# Patient Record
Sex: Male | Born: 1935 | Race: White | Hispanic: No | Marital: Married | Smoking: Never smoker
Health system: Southern US, Community
[De-identification: ages and names within clinical notes are randomized; demographics above are authoritative.]

## PROBLEM LIST (undated history)

## (undated) DIAGNOSIS — I219 Acute myocardial infarction, unspecified: Secondary | ICD-10-CM

## (undated) DIAGNOSIS — I10 Essential (primary) hypertension: Secondary | ICD-10-CM

## (undated) DIAGNOSIS — I609 Nontraumatic subarachnoid hemorrhage, unspecified: Secondary | ICD-10-CM

## (undated) DIAGNOSIS — I509 Heart failure, unspecified: Secondary | ICD-10-CM

## (undated) DIAGNOSIS — F32A Depression, unspecified: Secondary | ICD-10-CM

## (undated) DIAGNOSIS — F329 Major depressive disorder, single episode, unspecified: Secondary | ICD-10-CM

## (undated) DIAGNOSIS — I499 Cardiac arrhythmia, unspecified: Secondary | ICD-10-CM

## (undated) DIAGNOSIS — E7800 Pure hypercholesterolemia, unspecified: Secondary | ICD-10-CM

## (undated) DIAGNOSIS — R55 Syncope and collapse: Secondary | ICD-10-CM

## (undated) DIAGNOSIS — R0602 Shortness of breath: Secondary | ICD-10-CM

## (undated) DIAGNOSIS — I2510 Atherosclerotic heart disease of native coronary artery without angina pectoris: Secondary | ICD-10-CM

## (undated) DIAGNOSIS — G709 Myoneural disorder, unspecified: Secondary | ICD-10-CM

## (undated) DIAGNOSIS — K219 Gastro-esophageal reflux disease without esophagitis: Secondary | ICD-10-CM

## (undated) DIAGNOSIS — Z950 Presence of cardiac pacemaker: Secondary | ICD-10-CM

## (undated) DIAGNOSIS — M1990 Unspecified osteoarthritis, unspecified site: Secondary | ICD-10-CM

## (undated) DIAGNOSIS — I259 Chronic ischemic heart disease, unspecified: Secondary | ICD-10-CM

## (undated) DIAGNOSIS — N189 Chronic kidney disease, unspecified: Secondary | ICD-10-CM

## (undated) HISTORY — DX: Syncope and collapse: R55

## (undated) HISTORY — PX: CORONARY ARTERY BYPASS GRAFT: SHX141

## (undated) HISTORY — PX: EYE SURGERY: SHX253

## (undated) HISTORY — PX: OTHER SURGICAL HISTORY: SHX169

## (undated) HISTORY — DX: Heart failure, unspecified: I50.9

## (undated) HISTORY — DX: Unspecified osteoarthritis, unspecified site: M19.90

## (undated) HISTORY — DX: Nontraumatic subarachnoid hemorrhage, unspecified: I60.9

## (undated) HISTORY — DX: Chronic ischemic heart disease, unspecified: I25.9

## (undated) HISTORY — DX: Pure hypercholesterolemia, unspecified: E78.00

---

## 1998-03-18 HISTORY — PX: CARDIAC CATHETERIZATION: SHX172

## 1998-05-29 ENCOUNTER — Other Ambulatory Visit: Admission: RE | Admit: 1998-05-29 | Discharge: 1998-05-29 | Payer: Self-pay | Admitting: Cardiology

## 2002-06-29 ENCOUNTER — Emergency Department (HOSPITAL_COMMUNITY): Admission: EM | Admit: 2002-06-29 | Discharge: 2002-06-29 | Payer: Self-pay | Admitting: Emergency Medicine

## 2003-05-16 ENCOUNTER — Encounter: Payer: Self-pay | Admitting: Cardiology

## 2003-05-16 ENCOUNTER — Encounter: Admission: RE | Admit: 2003-05-16 | Discharge: 2003-05-16 | Payer: Self-pay | Admitting: Cardiology

## 2003-11-17 ENCOUNTER — Inpatient Hospital Stay (HOSPITAL_COMMUNITY): Admission: EM | Admit: 2003-11-17 | Discharge: 2003-11-27 | Payer: Self-pay | Admitting: Emergency Medicine

## 2006-07-22 ENCOUNTER — Encounter: Admission: RE | Admit: 2006-07-22 | Discharge: 2006-07-22 | Payer: Self-pay | Admitting: Cardiology

## 2008-01-27 ENCOUNTER — Emergency Department (HOSPITAL_COMMUNITY): Admission: EM | Admit: 2008-01-27 | Discharge: 2008-01-27 | Payer: Self-pay | Admitting: Family Medicine

## 2008-02-05 ENCOUNTER — Ambulatory Visit (HOSPITAL_COMMUNITY): Admission: RE | Admit: 2008-02-05 | Discharge: 2008-02-05 | Payer: Self-pay | Admitting: Urology

## 2008-02-05 HISTORY — PX: CIRCUMCISION: SUR203

## 2010-08-03 ENCOUNTER — Ambulatory Visit: Payer: Self-pay | Admitting: Cardiology

## 2010-09-01 ENCOUNTER — Ambulatory Visit: Payer: Self-pay | Admitting: Cardiology

## 2010-10-05 ENCOUNTER — Ambulatory Visit: Payer: Self-pay | Admitting: Cardiology

## 2010-11-02 ENCOUNTER — Ambulatory Visit: Payer: Self-pay | Admitting: Cardiology

## 2010-12-07 ENCOUNTER — Ambulatory Visit: Payer: Self-pay | Admitting: Cardiology

## 2011-01-11 ENCOUNTER — Ambulatory Visit: Payer: Self-pay | Admitting: Cardiology

## 2011-01-13 ENCOUNTER — Encounter: Payer: Self-pay | Admitting: Cardiology

## 2011-01-23 ENCOUNTER — Encounter: Payer: Self-pay | Admitting: Cardiology

## 2011-01-23 DIAGNOSIS — I10 Essential (primary) hypertension: Secondary | ICD-10-CM | POA: Insufficient documentation

## 2011-02-01 ENCOUNTER — Encounter (INDEPENDENT_AMBULATORY_CARE_PROVIDER_SITE_OTHER): Payer: Medicare Other

## 2011-02-01 DIAGNOSIS — Z7901 Long term (current) use of anticoagulants: Secondary | ICD-10-CM

## 2011-02-01 DIAGNOSIS — R079 Chest pain, unspecified: Secondary | ICD-10-CM

## 2011-02-11 ENCOUNTER — Encounter (INDEPENDENT_AMBULATORY_CARE_PROVIDER_SITE_OTHER): Payer: Medicare Other

## 2011-02-11 DIAGNOSIS — Z7901 Long term (current) use of anticoagulants: Secondary | ICD-10-CM

## 2011-02-11 DIAGNOSIS — I252 Old myocardial infarction: Secondary | ICD-10-CM

## 2011-03-14 ENCOUNTER — Other Ambulatory Visit: Payer: Self-pay | Admitting: Cardiology

## 2011-03-14 DIAGNOSIS — R609 Edema, unspecified: Secondary | ICD-10-CM

## 2011-03-15 ENCOUNTER — Encounter: Payer: Self-pay | Admitting: Cardiology

## 2011-03-15 ENCOUNTER — Ambulatory Visit (INDEPENDENT_AMBULATORY_CARE_PROVIDER_SITE_OTHER): Payer: Medicare Other | Admitting: Cardiology

## 2011-03-15 DIAGNOSIS — I209 Angina pectoris, unspecified: Secondary | ICD-10-CM | POA: Insufficient documentation

## 2011-03-15 DIAGNOSIS — D509 Iron deficiency anemia, unspecified: Secondary | ICD-10-CM

## 2011-03-15 DIAGNOSIS — F418 Other specified anxiety disorders: Secondary | ICD-10-CM

## 2011-03-15 DIAGNOSIS — F341 Dysthymic disorder: Secondary | ICD-10-CM

## 2011-03-15 DIAGNOSIS — I10 Essential (primary) hypertension: Secondary | ICD-10-CM

## 2011-03-15 DIAGNOSIS — Z951 Presence of aortocoronary bypass graft: Secondary | ICD-10-CM

## 2011-03-15 DIAGNOSIS — E119 Type 2 diabetes mellitus without complications: Secondary | ICD-10-CM

## 2011-03-15 DIAGNOSIS — E7800 Pure hypercholesterolemia, unspecified: Secondary | ICD-10-CM

## 2011-03-15 LAB — POCT INR: INR: 2

## 2011-03-15 NOTE — Assessment & Plan Note (Signed)
At the present time the patient's blood pressure is at the upper limits of normal.  He will continue same medication and cut down on dietary salt.  He is not having any headaches associated with the blood pressure.

## 2011-03-15 NOTE — Progress Notes (Signed)
History of Present Illness: This 76 year old married Caucasian gentleman is seen for a scheduled followup office visit.  He has a history of severe ischemic heart disease.  He had his first bypass graft operation in 1984 and a redo CABG surgery in 1992.  He's had a history of labile hypertension and a history of dyslipidemia.  He is also diabetic.  He lives with his wife who has severe dementia from Alzheimer's.  Current Outpatient Prescriptions  Medication Sig Dispense Refill  . ALPRAZolam (XANAX) 0.25 MG tablet Take 0.25 mg by mouth at bedtime as needed.        Marland Kitchen aspirin 81 MG tablet Take 81 mg by mouth daily.        . citalopram (CELEXA) 20 MG tablet Take 20 mg by mouth daily. Taking prn       . docusate sodium (COLACE) 100 MG capsule Take 100 mg by mouth as directed.        . ferrous sulfate 325 (65 FE) MG tablet Take 325 mg by mouth 2 (two) times daily.        . furosemide (LASIX) 40 MG tablet Take 40 mg by mouth as directed. Taking one-half daily       . glimepiride (AMARYL) 4 MG tablet Take 4 mg by mouth every morning before breakfast.        . isosorbide mononitrate (IMDUR) 60 MG 24 hr tablet Take 60 mg by mouth as directed. Taking one am and one-half pm       . losartan (COZAAR) 50 MG tablet Take 50 mg by mouth daily.        . metFORMIN (GLUCOPHAGE) 500 MG tablet Take 500 mg by mouth 2 (two) times daily with meals.        . metoprolol (LOPRESSOR) 50 MG tablet Take 50 mg by mouth 2 (two) times daily.       . nitroGLYCERIN (NITROSTAT) 0.4 MG SL tablet Place 0.4 mg under the tongue every 5 (five) minutes as needed.        . pantoprazole (PROTONIX) 40 MG tablet Take 40 mg by mouth daily.        . pravastatin (PRAVACHOL) 10 MG tablet Take 10 mg by mouth daily.          Allergies  Allergen Reactions  . Lipitor (Atorvastatin Calcium)     Gi issues  . Macrodantin   . Penicillins   . Ranexa     constipation  . Statins     Leg weakness    Patient Active Problem List  Diagnoses  .  HBP (high blood pressure)  . S/P CABG (coronary artery bypass graft)  . Diabetes mellitus  . Hypercholesterolemia  . Iron deficiency anemia  . Anxiety associated with depression  . Angina pectoris    History  Smoking status  . Never Smoker   Smokeless tobacco  . Not on file    History  Alcohol Use     No family history on file.  Review of Systems: Constitutional: no fever chills diaphoresis or fatigue or change in weight.  Head and neck: no hearing loss, no epistaxis, no photophobia or visual disturbance. Respiratory: No cough, shortness of breath or wheezing. Cardiovascular: No chest pain peripheral edema, palpitations. Gastrointestinal: No abdominal distention, no abdominal pain, no change in bowel habits hematochezia or melena. Genitourinary: No dysuria, no frequency, no urgency, no nocturia. Musculoskeletal:No arthralgias, no back pain, no gait disturbance or myalgias. Neurological: No dizziness, no headaches, no numbness, no seizures,  no syncope, no weakness, no tremors. Hematologic: No lymphadenopathy, no easy bruising. Psychiatric: No confusion, no hallucinations, no sleep disturbance.    Physical Exam: Filed Vitals:   03/15/11 1705  BP: 140/80  Pulse: 64  Weight: 176 lb (79.833 kg)   Vital signs as recorded.  This is a elderly gentleman in no acute distress.Pupils equal and reactive.   Extraocular Movements are full.  There is no scleral icterus.  The mouth and pharynx are normal.  The neck is supple.  The carotids reveal no bruits.  The jugular venous pressure is normal.  The thyroid is not enlarged.  There is no lymphadenopathy.The chest is clear to percussion and auscultation. There are no rales or rhonchi. Expansion of the chest is symmetrical.The precordium is quiet.  The first heart sound is normal.  The second heart sound is physiologically split.  There is no murmur gallop rub or click.  There is no abnormal lift or heave.The abdomen is soft and nontender.  Bowel sounds are normal. The liver and spleen are not enlarged. There Are no abdominal masses. There are no bruits.The pedal pulses are good.  There is no phlebitis.There is mild chronic edema of the right foot and lower leg..  There is no cyanosis or clubbing.  Assessment / Plan:

## 2011-03-15 NOTE — Assessment & Plan Note (Signed)
The patient is tolerating low dose pravastatin 10 mg daily.  Continue same medication.

## 2011-03-15 NOTE — Assessment & Plan Note (Addendum)
The patient has had very little chest pain recently.  He has not had to take any recent sublingual nitroglycerin.  He remains on Isosorbide mononitrate twice a day.He remains on long-term Coumadin for his widespread ischemic heart disease.  His INR is 2.0 and he'll continue same dose.

## 2011-03-15 NOTE — Assessment & Plan Note (Signed)
The patient has been eating a lot of sweets according to his children.  We've asked him to cut down on sweets.  Continue present diabetic medication to

## 2011-03-18 NOTE — Telephone Encounter (Signed)
[facility] °

## 2011-04-12 ENCOUNTER — Telehealth: Payer: Self-pay | Admitting: *Deleted

## 2011-04-12 ENCOUNTER — Ambulatory Visit (INDEPENDENT_AMBULATORY_CARE_PROVIDER_SITE_OTHER): Payer: Medicare Other | Admitting: *Deleted

## 2011-04-12 ENCOUNTER — Encounter: Payer: PRIVATE HEALTH INSURANCE | Admitting: *Deleted

## 2011-04-12 DIAGNOSIS — L089 Local infection of the skin and subcutaneous tissue, unspecified: Secondary | ICD-10-CM

## 2011-04-12 DIAGNOSIS — I209 Angina pectoris, unspecified: Secondary | ICD-10-CM

## 2011-04-12 LAB — POCT INR: INR: 1.9

## 2011-04-12 NOTE — Telephone Encounter (Signed)
Patient came in for protime and wanted someone to look at his leg.  Patient had what appeared to be a laceration on right leg above ankle.  Patient denied injury, however he has neuropathy and does not feel much in that area.  Area surrounding (about 4-5 inches) red and warm to touch.  Lawson Fiscal took a look and prescribed levaquin 500 mg daily for 7 days.  Patient will recheck inr on Thursday and Lawson Fiscal will look at it again.  Lawson Fiscal advise if worse or no better to call back.

## 2011-04-12 NOTE — Telephone Encounter (Signed)
Agree with plan 

## 2011-04-15 ENCOUNTER — Ambulatory Visit (INDEPENDENT_AMBULATORY_CARE_PROVIDER_SITE_OTHER): Payer: Medicare Other | Admitting: *Deleted

## 2011-04-15 DIAGNOSIS — Z7901 Long term (current) use of anticoagulants: Secondary | ICD-10-CM

## 2011-04-15 DIAGNOSIS — I4891 Unspecified atrial fibrillation: Secondary | ICD-10-CM

## 2011-04-15 LAB — POCT INR
INR: 2
INR: 2

## 2011-04-15 MED ORDER — LEVOFLOXACIN 500 MG PO TABS: 500.0000 mg | ORAL_TABLET | Freq: Every day | ORAL | Status: AC

## 2011-04-15 NOTE — Patient Instructions (Signed)
Advised patient to continue same dose of coumadin and recheck INR in 2 weeks.  Benjamin Noble did look at patients leg today and he will finish levoquin and call back Monday with an update

## 2011-04-15 NOTE — Telephone Encounter (Signed)
Patient back in to day for a protime, inr 2.0 and advised to continue same dose of medications.  Lawson Fiscal looked at the leg and stated it didn't feel as warm and is showing some improvement.  Advised patient and son to call back Monday with an update and if not a lot of improvement will probably need to see his dermatologist Dr. Dub Amis.  Patient still taking his levaquin daily.  Pulses present in bilaterally.

## 2011-04-21 ENCOUNTER — Telehealth: Payer: Self-pay | Admitting: *Deleted

## 2011-04-21 NOTE — Telephone Encounter (Signed)
Called son to see how patients leg was doing.  Son stated leg has shown much improvement.  Advised to call back if anything else comes up.

## 2011-04-23 ENCOUNTER — Telehealth: Payer: Self-pay | Admitting: Cardiology

## 2011-04-23 DIAGNOSIS — H6690 Otitis media, unspecified, unspecified ear: Secondary | ICD-10-CM

## 2011-04-23 MED ORDER — AZITHROMYCIN 250 MG PO TABS: ORAL_TABLET | ORAL | Status: AC

## 2011-04-23 NOTE — Telephone Encounter (Signed)
Spoke with patient and he stated he has had problems with ear infections for years.  Advised to decrease coumadin to 2 mg daily for 10 days

## 2011-04-23 NOTE — Telephone Encounter (Signed)
Okay for Z-Pak.  For the next 10 days take only 2 mg Coumadin daily

## 2011-04-23 NOTE — Telephone Encounter (Signed)
CALL PT'S SON, LEFT EAR BOTHERING PT, NEEDS A ZPAK/USES [facility] [address] CHART PLACED IN BOX.

## 2011-04-23 NOTE — Telephone Encounter (Signed)
Is this ok?  Any adjustments in coumadin?

## 2011-05-03 ENCOUNTER — Encounter: Payer: Self-pay | Admitting: Cardiology

## 2011-05-03 ENCOUNTER — Other Ambulatory Visit (INDEPENDENT_AMBULATORY_CARE_PROVIDER_SITE_OTHER): Payer: PRIVATE HEALTH INSURANCE | Admitting: *Deleted

## 2011-05-03 DIAGNOSIS — I259 Chronic ischemic heart disease, unspecified: Secondary | ICD-10-CM

## 2011-05-04 NOTE — Op Note (Signed)
NAME:  Benjamin Noble, Benjamin Noble NO.:  [id number]   MEDICAL RECORD NO.:  [id number]          PATIENT TYPE:  AMB   LOCATION:  DAY                          FACILITY:  [facility]   PHYSICIAN:  Bertram Millard. Dahlstedt, M.D.DATE OF BIRTH:  1922/01/04   DATE OF PROCEDURE:  02/05/2008  DATE OF DISCHARGE:  02/05/2008                               OPERATIVE REPORT   PREOPERATIVE DIAGNOSIS:  Phimosis, hematuria.   POSTOPERATIVE DIAGNOSIS:  Phimosis, hematuria.   PROCEDURE:  Circumcision, cystoscopy.   SURGEON:  Bertram Millard. Dahlstedt, M.D.   ANESTHESIA:  Local with MAC.   COMPLICATIONS:  None.   SPECIMEN:  Foreskin to the trash.   BRIEF HISTORY:  An 77 year old male who recently presented with  difficulty urinating.  Over the past few months he has had ballooning of  his foreskin with urinating.  He has been unable to retract his foreskin  for quite some time.  He was seen in urgent care and sent here with a  Foley catheter in.  He has had some bleeding around this catheter, but  denies any gross hematuria prior to placement the catheter.  It was  recommended that he have circumcision to decrease  his symptoms of  urinary obstruction, and cystoscopy to evaluate gross hematuria.  He  presents for that procedure at the present time.   DESCRIPTION OF PROCEDURE:  The patient was identified in the holding  area, taken to the operating room where he was placed in the supine  position on the table.  He was sedated.  Genitalia and perineum were  prepped and draped.  A dorsal penile block was performed with  approximately 12 mL of half percent plain Marcaine.  Circumcising  incisions were made proximally and distally in the foreskin with the  skin excised.  The skin suturing was performed with at first chromic  sutures placed in simple interrupted fashion on four quadrants.  The  same chromic was then used to run the skin in a simple running fashion  in between.  At this point, flexible  cystoscopy was performed.  The  urethra was normal.  The prostate was nonobstructed from what I could  see.  There was mild irritation/inflammation of the bladder neck, most  likely from indwelling catheter x 2 weeks.  No lesions were seen  within the bladder.  I felt that since there would be no obstruction, a  catheter was not left in.  His bladder was drained with a red Robinson  catheter.  Dressings were placed on the circumcision wound.  At this  point the procedure was terminated.  The patient was awakened and taken  to PACU in stable condition.      Bertram Millard. Dahlstedt, M.D.  Electronically Signed     SMD/MEDQ  D:  02/05/2008  T:  02/06/2008  Job:  [id number]   cc:   Cassell Clement, M.D.  Fax: [phone]

## 2011-05-07 NOTE — Discharge Summary (Signed)
NAME:  Benjamin Noble, Benjamin Noble                  ACCOUNT NO.:  [id number]   MEDICAL RECORD NO.:  [id number]                   PATIENT TYPE:  INP   LOCATION:  3712                                 FACILITY:  [facility]   PHYSICIAN:  Vesta Mixer, M.D.              DATE OF BIRTH:  [birth year]   DATE OF ADMISSION:  11/17/2003  DATE OF DISCHARGE:  11/19/2003                                 DISCHARGE SUMMARY   DISCHARGE DIAGNOSES:  1. Coronary artery disease.  2. Congestive heart failure.  3. Diabetes mellitus.  4. Chronic anticoagulation.  5. Hyperlipidemia.  6. Chronic left leg pain.   DISCHARGE MEDICATIONS:  1. Enteric coated aspirin 325 mg a day.  2. Imdur 90 mg a day.  3. Glucophage 500 mg b.i.d.  4. Lasix 40 mg b.i.d.  5. Plavix 75 mg a day.  6. Metoprolol 50 mg b.i.d.  7. Cozaar 50 mg a day.  8. Coumadin 2 mg 5 days a week with 4 mg on Mondays and Fridays.  9. Crestor 10 mg a day.  10.      Zantac 75 mg b.i.d.  11.      Colace as needed.  12.      Prandin 2 mg a day.  13.      Nitroglycerin patch as directed previously by Dr. Patty Sermons.   DISPOSITION:  The patient will return to see Dr. Patty Sermons in 1 to 2 weeks.  He should basically continue the same medications that he was on at home. He  is to ambulate as much as possible.   HISTORY OF PRESENT ILLNESS:  Mr. Chelf is a 77 year old gentleman with a  history of  severe  inoperable coronary artery disease. He is admitted by  Dr. Mayford Knife on November 17, 2003, for episodes of chest pain. Please see the  dictated history and physical for further details.   HOSPITAL COURSE BY PROBLEM:  PROBLEM #1, CORONARY ARTERY DISEASE:  The  patient's chest pain resolved fairly quickly during the hospitalization. He  did have mild elevations of his troponin level  consistent with a  subendocardial myocardial infarction. He remained quite stable and did not  have any further  episodes of pain. His last heart  catheterization in 1999  revealed  severe diffuse disease and he was thought not to be a candidate  for any further interventions at that time. I had a long discussion with Mr.  Morones and he decided that he did not  want to have any heart  catheterization at this time because of the low  likelihood of finding anything to fix. He remained quite stable on his home  medications and we will discharge  him on the above noted medications. He is  to call us if he has any further  episodes of chest pain. All of his other  medical problems are relatively stable.  Vesta Mixer, M.D.    PJN/MEDQ  D:  11/19/2003  T:  11/19/2003  Job:  [id number]   cc:   Cassell Clement, M.D.  [address]  [place]  [place] [address]  Fax: [phone]

## 2011-05-14 ENCOUNTER — Telehealth: Payer: Self-pay | Admitting: Cardiology

## 2011-05-14 NOTE — Telephone Encounter (Signed)
Pt's son called said he had FMLA paperwork to fill out please call

## 2011-05-14 NOTE — Telephone Encounter (Signed)
Forms on Dr Jenness Corner desk

## 2011-05-24 ENCOUNTER — Encounter: Payer: Self-pay | Admitting: Cardiology

## 2011-05-24 ENCOUNTER — Ambulatory Visit (INDEPENDENT_AMBULATORY_CARE_PROVIDER_SITE_OTHER): Payer: Medicare Other | Admitting: *Deleted

## 2011-05-24 ENCOUNTER — Ambulatory Visit (INDEPENDENT_AMBULATORY_CARE_PROVIDER_SITE_OTHER): Payer: Medicare Other | Admitting: Cardiology

## 2011-05-24 DIAGNOSIS — E119 Type 2 diabetes mellitus without complications: Secondary | ICD-10-CM

## 2011-05-24 DIAGNOSIS — I259 Chronic ischemic heart disease, unspecified: Secondary | ICD-10-CM

## 2011-05-24 DIAGNOSIS — I209 Angina pectoris, unspecified: Secondary | ICD-10-CM

## 2011-05-24 DIAGNOSIS — I10 Essential (primary) hypertension: Secondary | ICD-10-CM

## 2011-05-24 LAB — POCT INR: INR: 3.2

## 2011-05-24 NOTE — Assessment & Plan Note (Signed)
Since last visit the patient has cut back on his dietary salt.  On his last visit his blood pressure was on the high side but presently a discogram to normal.  The patient has not been expressing any dizzy spells or syncope.

## 2011-05-24 NOTE — Assessment & Plan Note (Signed)
[facility]  At their request we will switch him from brand name Amaryl to generic glimepiride 4 mg each morning.  Likewise we will switch him from Cozaar 50 mg to losartan 50 mg each day.  We also called in some nitroglycerin 0.4 mg sublingually p.r.n.

## 2011-05-24 NOTE — Progress Notes (Signed)
Benjamin Noble Date of Birth:  [birth year] [facility] [address]   [address] [place], [place]  [address] [phone]           Fax   [phone]  History of Present Illness: This 77 year old gentleman is seen for a scheduled two-month followup office visit.  He has a complex past medical history.  He has a history of severe ischemic heart disease.  He had coronary artery bypass graft surgery in 1984.  He had a redo CABG in 1992.  He's had frequent chest pain consistent with angina pectoris although recently his chest pain frequency has decreased because his activity level is decreased.  He has a history of diabetes mellitus and a history of essential hypertension and a history of hypercholesterolemia.  His last nuclear stress test was in 1987 and was a RNA stress test which look at wall motion 3 years after his initial bypass surgery.  His last echocardiogram was 01/15/08 showing an ejection fraction of 55-60% and mild aortic sclerosis and normal pulmonary artery pressure.  Patient is on long-term Coumadin because of his intractable angina pectoris.  He's not having any problems from the Coumadin.  Current Outpatient Prescriptions  Medication Sig Dispense Refill  . aspirin 81 MG tablet Take 81 mg by mouth daily.        Marland Kitchen docusate sodium (COLACE) 100 MG capsule Take 100 mg by mouth as directed.        . ferrous sulfate 325 (65 FE) MG tablet Take 325 mg by mouth daily with breakfast.       . furosemide (LASIX) 40 MG tablet TAKE ONE-HALF TABLET BY MOUTH EVERY DAY  30 tablet  11  . glimepiride (AMARYL) 4 MG tablet Take 4 mg by mouth every morning before breakfast.        . isosorbide mononitrate (IMDUR) 60 MG 24 hr tablet Take 60 mg by mouth as directed. Taking one am and one-half pm       . losartan (COZAAR) 50 MG tablet Take 50 mg by mouth daily.        . metFORMIN (GLUCOPHAGE) 500 MG tablet Take 500 mg by mouth 2 (two) times daily with meals.        .  metoprolol (LOPRESSOR) 50 MG tablet Take 50 mg by mouth 3 (three) times daily.       . nitroGLYCERIN (NITROSTAT) 0.4 MG SL tablet Place 0.4 mg under the tongue every 5 (five) minutes as needed.        . pantoprazole (PROTONIX) 40 MG tablet Take 40 mg by mouth daily.        Marland Kitchen ALPRAZolam (XANAX) 0.25 MG tablet Take 0.25 mg by mouth at bedtime as needed.        . citalopram (CELEXA) 20 MG tablet Take 20 mg by mouth daily. Taking prn       . DISCONTD: pravastatin (PRAVACHOL) 10 MG tablet Take 10 mg by mouth daily.          Allergies  Allergen Reactions  . Lipitor (Atorvastatin Calcium)     Gi issues  . Macrodantin   . Penicillins   . Ranexa     constipation  . Statins     Leg weakness    Patient Active Problem List  Diagnoses  . HBP (high blood pressure)  . S/P CABG (coronary artery bypass graft)  . Diabetes mellitus  . Hypercholesterolemia  . Iron deficiency anemia  . Anxiety associated  with depression  . Angina pectoris    History  Smoking status  . Never Smoker   Smokeless tobacco  . Not on file    History  Alcohol Use     No family history on file.  Review of Systems: Constitutional: no fever chills diaphoresis or fatigue or change in weight.  Head and neck: no hearing loss, no epistaxis, no photophobia or visual disturbance. Respiratory: No cough, shortness of breath or wheezing. Cardiovascular: No chest pain peripheral edema, palpitations. Gastrointestinal: No abdominal distention, no abdominal pain, no change in bowel habits hematochezia or melena. Genitourinary: No dysuria, no frequency, no urgency, no nocturia. Musculoskeletal:No arthralgias, no back pain, no gait disturbance or myalgias. Neurological: No dizziness, no headaches, no numbness, no seizures, no syncope, no weakness, no tremors. Hematologic: No lymphadenopathy, no easy bruising. Psychiatric: No confusion, no hallucinations, no sleep disturbance.    Physical Exam: Filed Vitals:   05/24/11  1103  BP: 105/60  Pulse: 74  The general appearance is that of a slightly pale gentleman in no acute distress.  He is in a wheelchair.Pupils equal and reactive.   Extraocular Movements are full.  There is no scleral icterus.  The mouth and pharynx are normal.  The neck is supple.  The carotids reveal no bruits.  The jugular venous pressure is normal.  The thyroid is not enlarged.  There is no lymphadenopathy.The chest is clear to percussion and auscultation. There are no rales or rhonchi. Expansion of the chest is symmetrical.The precordium is quiet.  The first heart sound is normal.  The second heart sound is physiologically split.  There is no murmur gallop rub or click.  There is no abnormal lift or heave.The abdomen is soft and nontender. Bowel sounds are normal. The liver and spleen are not enlarged. There Are no abdominal masses. There are no bruits.  Normal extremity without phlebitis or edema.The skin is warm and dry.  There is no rash.  He does have some bruises from his warfarin.Strength is normal and symmetrical in all extremities.  There is no lateralizing weakness.  There are no sensory deficits.   Assessment / Plan: Continue same medication.  Recheck in 2 months for office visit and fasting lab work

## 2011-05-24 NOTE — Assessment & Plan Note (Signed)
The patient has a past history of severe intractable angina pectoris.  Now he is more sedentary his angina has resolved and is less frequent

## 2011-06-07 ENCOUNTER — Ambulatory Visit (INDEPENDENT_AMBULATORY_CARE_PROVIDER_SITE_OTHER): Payer: Medicare Other | Admitting: *Deleted

## 2011-06-07 DIAGNOSIS — I4891 Unspecified atrial fibrillation: Secondary | ICD-10-CM

## 2011-06-07 LAB — POCT INR: INR: 1.6

## 2011-06-09 ENCOUNTER — Encounter: Payer: Self-pay | Admitting: Cardiology

## 2011-06-11 ENCOUNTER — Other Ambulatory Visit: Payer: Self-pay | Admitting: *Deleted

## 2011-06-16 ENCOUNTER — Other Ambulatory Visit: Payer: Self-pay | Admitting: *Deleted

## 2011-06-16 DIAGNOSIS — R110 Nausea: Secondary | ICD-10-CM

## 2011-06-16 DIAGNOSIS — I2510 Atherosclerotic heart disease of native coronary artery without angina pectoris: Secondary | ICD-10-CM

## 2011-06-16 DIAGNOSIS — F329 Major depressive disorder, single episode, unspecified: Secondary | ICD-10-CM

## 2011-06-16 DIAGNOSIS — R52 Pain, unspecified: Secondary | ICD-10-CM

## 2011-06-16 DIAGNOSIS — R197 Diarrhea, unspecified: Secondary | ICD-10-CM

## 2011-06-16 MED ORDER — NITROGLYCERIN 0.4 MG SL SUBL: 0.4000 mg | SUBLINGUAL_TABLET | SUBLINGUAL | Status: DC | PRN

## 2011-06-16 MED ORDER — DIPHENOXYLATE-ATROPINE 2.5-0.025 MG PO TABS: 1.0000 | ORAL_TABLET | Freq: Four times a day (QID) | ORAL | Status: AC | PRN

## 2011-06-16 MED ORDER — ACETAMINOPHEN 500 MG PO TABS: 500.0000 mg | ORAL_TABLET | Freq: Four times a day (QID) | ORAL | Status: AC | PRN

## 2011-06-16 MED ORDER — ASPIRIN 81 MG PO CHEW: 81.0000 mg | CHEWABLE_TABLET | Freq: Every day | ORAL | Status: DC

## 2011-06-16 MED ORDER — PROMETHAZINE HCL 25 MG PO TABS: 25.0000 mg | ORAL_TABLET | Freq: Four times a day (QID) | ORAL | Status: AC | PRN

## 2011-06-16 MED ORDER — ISOSORBIDE MONONITRATE ER 30 MG PO TB24: ORAL_TABLET | ORAL | Status: DC

## 2011-06-16 MED ORDER — IBUPROFEN 200 MG PO TABS: 200.0000 mg | ORAL_TABLET | Freq: Four times a day (QID) | ORAL | Status: AC | PRN

## 2011-06-16 MED ORDER — CITALOPRAM HYDROBROMIDE 20 MG PO TABS: 20.0000 mg | ORAL_TABLET | Freq: Every day | ORAL | Status: DC

## 2011-06-16 NOTE — Telephone Encounter (Signed)
Son brought in a list of medications he needed sent to [place], will fax to [phone] attn: Dr Jules Husbands.  Advised son to only let patient use ibuprofen sparingly.

## 2011-06-21 ENCOUNTER — Ambulatory Visit (INDEPENDENT_AMBULATORY_CARE_PROVIDER_SITE_OTHER): Payer: Medicare Other | Admitting: *Deleted

## 2011-06-21 DIAGNOSIS — I4891 Unspecified atrial fibrillation: Secondary | ICD-10-CM

## 2011-06-21 LAB — POCT INR: INR: 1.3

## 2011-06-24 ENCOUNTER — Telehealth: Payer: Self-pay | Admitting: Cardiology

## 2011-06-24 NOTE — Telephone Encounter (Signed)
WANTS TO SPEAK TO RN ABOUT PATIENT-restlessness

## 2011-06-24 NOTE — Telephone Encounter (Signed)
Son states dad is restless secondary to mother and her problems.  Never contacted neurology for follow up with mother.  Advised he needed to call and get appointment with them.

## 2011-06-28 ENCOUNTER — Telehealth: Payer: Self-pay | Admitting: Cardiology

## 2011-06-28 NOTE — Telephone Encounter (Signed)
Patient son calling and wants to make sure scripts have been faxed to [place].  Please call with status

## 2011-06-28 NOTE — Telephone Encounter (Signed)
Advised sent

## 2011-07-01 ENCOUNTER — Ambulatory Visit (INDEPENDENT_AMBULATORY_CARE_PROVIDER_SITE_OTHER): Payer: Medicare Other | Admitting: *Deleted

## 2011-07-01 DIAGNOSIS — I4891 Unspecified atrial fibrillation: Secondary | ICD-10-CM

## 2011-07-01 LAB — POCT INR: INR: 2.7

## 2011-07-12 ENCOUNTER — Other Ambulatory Visit: Payer: Self-pay | Admitting: Cardiology

## 2011-07-12 DIAGNOSIS — E119 Type 2 diabetes mellitus without complications: Secondary | ICD-10-CM

## 2011-07-12 DIAGNOSIS — I4891 Unspecified atrial fibrillation: Secondary | ICD-10-CM

## 2011-07-12 DIAGNOSIS — I119 Hypertensive heart disease without heart failure: Secondary | ICD-10-CM

## 2011-07-12 MED ORDER — WARFARIN SODIUM 2 MG PO TABS: ORAL_TABLET | ORAL | Status: DC

## 2011-07-12 MED ORDER — LOSARTAN POTASSIUM 50 MG PO TABS: 50.0000 mg | ORAL_TABLET | Freq: Every day | ORAL | Status: DC

## 2011-07-12 MED ORDER — GLIMEPIRIDE 4 MG PO TABS: 4.0000 mg | ORAL_TABLET | Freq: Every day | ORAL | Status: DC

## 2011-07-12 MED ORDER — WARFARIN SODIUM 5 MG PO TABS: ORAL_TABLET | ORAL | Status: DC

## 2011-07-12 NOTE — Telephone Encounter (Signed)
CALL PT'S SON ABOUT HIS MEDS AND THE ONES HE NEEDS [facility] CALL BACK AT CELL [phone]. CHART IN BOX.

## 2011-07-12 NOTE — Telephone Encounter (Signed)
Spoke with son, [facility] to [facility]

## 2011-07-26 ENCOUNTER — Encounter: Payer: PRIVATE HEALTH INSURANCE | Admitting: *Deleted

## 2011-07-26 ENCOUNTER — Other Ambulatory Visit: Payer: Self-pay | Admitting: *Deleted

## 2011-07-26 ENCOUNTER — Other Ambulatory Visit: Payer: PRIVATE HEALTH INSURANCE | Admitting: *Deleted

## 2011-07-26 ENCOUNTER — Encounter: Payer: Self-pay | Admitting: Cardiology

## 2011-07-26 ENCOUNTER — Ambulatory Visit (INDEPENDENT_AMBULATORY_CARE_PROVIDER_SITE_OTHER): Payer: Medicare Other | Admitting: Cardiology

## 2011-07-26 ENCOUNTER — Ambulatory Visit (INDEPENDENT_AMBULATORY_CARE_PROVIDER_SITE_OTHER): Payer: PRIVATE HEALTH INSURANCE | Admitting: *Deleted

## 2011-07-26 VITALS — BP 140/80 | HR 78 | Wt 175.0 lb

## 2011-07-26 DIAGNOSIS — I209 Angina pectoris, unspecified: Secondary | ICD-10-CM

## 2011-07-26 DIAGNOSIS — E7800 Pure hypercholesterolemia, unspecified: Secondary | ICD-10-CM

## 2011-07-26 DIAGNOSIS — I10 Essential (primary) hypertension: Secondary | ICD-10-CM

## 2011-07-26 DIAGNOSIS — E119 Type 2 diabetes mellitus without complications: Secondary | ICD-10-CM

## 2011-07-26 DIAGNOSIS — I4891 Unspecified atrial fibrillation: Secondary | ICD-10-CM

## 2011-07-26 DIAGNOSIS — R0989 Other specified symptoms and signs involving the circulatory and respiratory systems: Secondary | ICD-10-CM

## 2011-07-26 LAB — BASIC METABOLIC PANEL
BUN: 29 mg/dL — ABNORMAL HIGH (ref 6–23)
Creatinine, Ser: 1.1 mg/dL (ref 0.4–1.5)
GFR: 65.27 mL/min (ref >60.00)
Glucose, Bld: 159 mg/dL — ABNORMAL HIGH (ref 70–99)

## 2011-07-26 LAB — CBC WITH DIFFERENTIAL/PLATELET
Basophils Absolute: 0 10*3/uL (ref 0.0–0.1)
Eosinophils Absolute: 0.3 10*3/uL (ref 0.0–0.7)
HCT: 39.8 % (ref 39.0–52.0)
Lymphs Abs: 1 10*3/uL (ref 0.7–4.0)
MCHC: 33.2 g/dL (ref 30.0–36.0)
MCV: 94.2 fl (ref 78.0–100.0)
Monocytes Absolute: 0.8 10*3/uL (ref 0.1–1.0)
Monocytes Relative: 10.3 % (ref 3.0–12.0)
Neutro Abs: 6 10*3/uL (ref 1.4–7.7)
Platelets: 278 10*3/uL (ref 150.0–400.0)
RDW: 13.8 % (ref 11.5–14.6)

## 2011-07-26 LAB — POCT INR: INR: 2.7

## 2011-07-26 LAB — HEPATIC FUNCTION PANEL
ALT: 24 U/L (ref 0–53)
AST: 17 U/L (ref 0–37)
Alkaline Phosphatase: 106 U/L (ref 39–117)
Bilirubin, Direct: 0.1 mg/dL (ref 0.0–0.3)
Total Bilirubin: 0.9 mg/dL (ref 0.3–1.2)
Total Protein: 7.1 g/dL (ref 6.0–8.3)

## 2011-07-26 LAB — HEMOGLOBIN A1C: Hgb A1c MFr Bld: 8 % — ABNORMAL HIGH (ref 4.6–6.5)

## 2011-07-26 LAB — LIPID PANEL: Cholesterol: 153 mg/dL (ref 0–200)

## 2011-07-26 NOTE — Assessment & Plan Note (Signed)
The patient has not been experiencing any hypoglycemic episodes. 

## 2011-07-26 NOTE — Assessment & Plan Note (Signed)
Patient has a past history of elevated blood pressure.  He has had normal readings recently on his present medications.  His not having a problem with headaches.  He's not had any symptoms of congestive heart failure

## 2011-07-26 NOTE — Assessment & Plan Note (Signed)
No change in his frequent sublingual use of nitroglycerin for angina pectoris.  The patient is weak and has difficulty moving about his house with a manual wheelchair and has requested a motorized wheelchair.  I gave him a prescription for that today.  It tires him out greatly to try to self propel himself around in his manual wheelchair.

## 2011-07-26 NOTE — Progress Notes (Signed)
[name] Date of Birth:  01-02-1922 [facility] / [facility] [address]   [address] [place], [place]  [address] [phone]           Fax   [phone]  History of Present Illness: This pleasant 77 year old gentleman is seen for a scheduled followup office visit.  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  He had coronary artery bypass graft surgery in 1984 and he had a redo CABG in 1992.  He is not a candidate for further invasive procedures.  He does take frequent sublingual nitroglycerin.  He has been on long-term Coumadin because of angina decubitus.  He's had a history of hypercholesterolemia and hypertension.  He is also diabetic.  He's been under a lot of stress caring for his wife who has dementia.  Current Outpatient Prescriptions  Medication Sig Dispense Refill  . ALPRAZolam (XANAX) 0.25 MG tablet Take 0.25 mg by mouth at bedtime as needed.        Marland Kitchen aspirin 81 MG chewable tablet Chew 1 tablet (81 mg total) by mouth daily.  90 tablet  3  . citalopram (CELEXA) 20 MG tablet Take 1 tablet (20 mg total) by mouth daily.  90 tablet  3  . docusate sodium (COLACE) 100 MG capsule Take 100 mg by mouth as directed.        . ferrous sulfate 325 (65 FE) MG tablet Take 325 mg by mouth daily with breakfast.       . furosemide (LASIX) 40 MG tablet TAKE ONE-HALF TABLET BY MOUTH EVERY DAY  30 tablet  11  . glimepiride (AMARYL) 4 MG tablet Take 1 tablet (4 mg total) by mouth daily before breakfast.  90 tablet  3  . isosorbide mononitrate (IMDUR) 30 MG 24 hr tablet Take 2 in the morning and 1 in the evening  270 tablet  3  . losartan (COZAAR) 50 MG tablet Take 1 tablet (50 mg total) by mouth daily.  90 tablet  3  . metFORMIN (GLUCOPHAGE) 500 MG tablet Take 500 mg by mouth 2 (two) times daily with meals.        . metoprolol (LOPRESSOR) 50 MG tablet Take 50 mg by mouth 3 (three) times daily.       . nitroGLYCERIN (NITROSTAT) 0.4 MG SL tablet  Place 1 tablet (0.4 mg total) under the tongue every 5 (five) minutes as needed.  100 tablet  3  . pantoprazole (PROTONIX) 40 MG tablet Take 40 mg by mouth daily.        Marland Kitchen warfarin (COUMADIN) 2 MG tablet One tablet Tuesday, Thursday, Saturday, and Sunday or as directed  90 tablet  3  . warfarin (COUMADIN) 5 MG tablet One tablet on Monday, Wednesday, and Friday or as directed  60 tablet  11    Allergies  Allergen Reactions  . Lipitor (Atorvastatin Calcium)     Gi issues  . Macrodantin   . Penicillins   . Ranexa     constipation  . Statins     Leg weakness    Patient Active Problem List  Diagnoses  . HBP (high blood pressure)  . S/P CABG (coronary artery bypass graft)  . Diabetes mellitus  . Hypercholesterolemia  . Iron deficiency anemia  . Anxiety associated with depression  . Angina pectoris  . S/P CABG (coronary artery bypass graft)  . S/P CABG (coronary artery bypass graft)    History  Smoking status  .  Never Smoker   Smokeless tobacco  . Not on file    History  Alcohol Use     No family history on file.  Review of Systems: Constitutional: no fever chills diaphoresis or fatigue or change in weight.  Head and neck: no hearing loss, no epistaxis, no photophobia or visual disturbance. Respiratory: No cough, shortness of breath or wheezing. Cardiovascular: No chest pain peripheral edema, palpitations. Gastrointestinal: No abdominal distention, no abdominal pain, no change in bowel habits hematochezia or melena. Genitourinary: No dysuria, no frequency, no urgency, no nocturia. Musculoskeletal:No arthralgias, no back pain, no gait disturbance or myalgias. Neurological: No dizziness, no headaches, no numbness, no seizures, no syncope, no weakness, no tremors. Hematologic: No lymphadenopathy, no easy bruising. Psychiatric: No confusion, no hallucinations, no sleep disturbance.    Physical Exam: Filed Vitals:   07/26/11 0935  BP: 140/80  Pulse: 78   The  general appearance reveals a slightly pale gentleman who arrives in a manual wheelchair.  He appears to be weak.  He is in no acute distress.Pupils equal and reactive.   Extraocular Movements are full.  There is no scleral icterus.  The mouth and pharynx are normal.  The neck is supple.  The carotids reveal no bruits.  The jugular venous pressure is normal.  The thyroid is not enlarged.  There is no lymphadenopathy.  The chest is clear to percussion and auscultation. There are no rales or rhonchi. Expansion of the chest is symmetrical.  The precordium is quiet.  The first heart sound is normal.  The second heart sound is physiologically split.  There is no murmur gallop rub or click.  There is no abnormal lift or heave.  The abdomen is soft and nontender. Bowel sounds are normal. The liver and spleen are not enlarged. There Are no abdominal masses. There are no bruits.  The pedal pulses are good.  There is no phlebitis or edema.  There is no cyanosis or clubbing.  Strength is normal and symmetrical in all extremities.  There is no lateralizing weakness.  There are no sensory deficits.He has general weakness    Assessment / Plan: Continue on same medication.  We gave him a prescription at his request for a motorized wheelchair.  He gets most of his medications from the [facility].  He will also talk to the [place] about a motorized wheelchair.  Recheck here in 3 months for followup office visit.

## 2011-07-29 ENCOUNTER — Other Ambulatory Visit: Payer: Self-pay | Admitting: *Deleted

## 2011-07-29 ENCOUNTER — Telehealth: Payer: Self-pay | Admitting: *Deleted

## 2011-07-29 DIAGNOSIS — E119 Type 2 diabetes mellitus without complications: Secondary | ICD-10-CM

## 2011-07-29 DIAGNOSIS — Z79899 Other long term (current) drug therapy: Secondary | ICD-10-CM

## 2011-07-29 NOTE — Telephone Encounter (Signed)
Advised son of labs and medication change

## 2011-07-29 NOTE — Telephone Encounter (Signed)
Message copied by Burnell Blanks on Thu Jul 29, 2011  9:27 AM ------      Message from: Cassell Clement      Created: Tue Jul 27, 2011  1:28 PM       Please report. CBC is normal.  Lipids are satisfactory.  LFTs are normal. Blood sugar is high 159.  The A1C is high 8.0.  Watch carbs in diet.  Continue same meds except increase metformin to 1000 mg in am and 500 mg in pm.  Recheck A1C at next OV along with fasting BMET and HFP.

## 2011-07-29 NOTE — Progress Notes (Signed)
Advised son

## 2011-08-03 ENCOUNTER — Telehealth: Payer: Self-pay | Admitting: Cardiology

## 2011-08-03 NOTE — Telephone Encounter (Signed)
Advised daughter 

## 2011-08-03 NOTE — Telephone Encounter (Signed)
.   Head feeling better now just having a lot of nause/queezy feeling.  Has been on antibiotic for about a week.  Patient wants to know if he would benefit from a NTG patch.  Please advise

## 2011-08-03 NOTE — Telephone Encounter (Signed)
[facility] started [facility] on some antibiotics, clindomycin 150 mg bid.  blood pressure on recheck was down 146/76 HR 65.  Headache still feel "kind of funny".  Not a lot of chest pains just feels like he is smothering.  Does get fatigued quickly.  Please advise.

## 2011-08-03 NOTE — Telephone Encounter (Signed)
The nausea is probably coming from the clindamycin.  I don't think the nitroglycerin patch would really help unless she's also having a lot of chest pressure or angina.  He may wish to talk to his [place] doctors about switching him to something other than clindamycin.

## 2011-08-03 NOTE — Telephone Encounter (Signed)
Pt's daughter states his bp was 176/99 just now, pt's daughter took bp again at 158/83 pulse 65 Five minutes later, pt states his head is hurting and he is feeling funny, he had a nitro and an aspirin in between, please call pt's daughter regarding father's symptoms, chart in box

## 2011-08-03 NOTE — Telephone Encounter (Signed)
Take an extra 40 mg Lasix Lloyd Huger to help with blood pressure and shortness of breath

## 2011-08-09 ENCOUNTER — Other Ambulatory Visit: Payer: Self-pay | Admitting: Cardiology

## 2011-08-09 NOTE — Telephone Encounter (Signed)
Fax received from pharmacy. Refill completed. Jodette Junella Domke RN  

## 2011-08-11 ENCOUNTER — Other Ambulatory Visit: Payer: Self-pay | Admitting: Cardiology

## 2011-08-11 DIAGNOSIS — R110 Nausea: Secondary | ICD-10-CM

## 2011-08-11 MED ORDER — PROMETHAZINE HCL 25 MG PO TABS: 25.0000 mg | ORAL_TABLET | Freq: Every day | ORAL | Status: DC | PRN

## 2011-08-11 NOTE — Telephone Encounter (Signed)
[facility] pharmacy called received escribe on 0820 pt is requesting 90 pills instead of 30 please call

## 2011-08-11 NOTE — Telephone Encounter (Signed)
Ok'd # [phone]

## 2011-08-30 ENCOUNTER — Ambulatory Visit (INDEPENDENT_AMBULATORY_CARE_PROVIDER_SITE_OTHER): Payer: Medicare Other | Admitting: *Deleted

## 2011-08-30 DIAGNOSIS — I4891 Unspecified atrial fibrillation: Secondary | ICD-10-CM

## 2011-09-10 LAB — BASIC METABOLIC PANEL
BUN: 14
CO2: 29
Calcium: 9.2
Creatinine, Ser: 1.17
GFR calc Af Amer: >60
Glucose, Bld: 199 — ABNORMAL HIGH

## 2011-09-10 LAB — URINALYSIS, ROUTINE W REFLEX MICROSCOPIC
Glucose, UA: NEGATIVE
Nitrite: NEGATIVE
Specific Gravity, Urine: 1.027
pH: 6

## 2011-09-10 LAB — URINE MICROSCOPIC-ADD ON

## 2011-09-10 LAB — POCT URINALYSIS DIP (DEVICE)
Bilirubin Urine: NEGATIVE
Nitrite: NEGATIVE
pH: 5

## 2011-09-10 LAB — URINE CULTURE: Colony Count: >=100000

## 2011-09-27 ENCOUNTER — Ambulatory Visit (INDEPENDENT_AMBULATORY_CARE_PROVIDER_SITE_OTHER): Payer: Medicare Other | Admitting: *Deleted

## 2011-09-27 DIAGNOSIS — I4891 Unspecified atrial fibrillation: Secondary | ICD-10-CM

## 2011-11-01 ENCOUNTER — Other Ambulatory Visit: Payer: Medicare Other | Admitting: *Deleted

## 2011-11-01 ENCOUNTER — Ambulatory Visit (INDEPENDENT_AMBULATORY_CARE_PROVIDER_SITE_OTHER): Payer: Medicare Other | Admitting: *Deleted

## 2011-11-01 ENCOUNTER — Ambulatory Visit (INDEPENDENT_AMBULATORY_CARE_PROVIDER_SITE_OTHER): Payer: Medicare Other | Admitting: Cardiology

## 2011-11-01 ENCOUNTER — Other Ambulatory Visit: Payer: Self-pay | Admitting: Cardiology

## 2011-11-01 ENCOUNTER — Encounter: Payer: Self-pay | Admitting: Cardiology

## 2011-11-01 ENCOUNTER — Other Ambulatory Visit (INDEPENDENT_AMBULATORY_CARE_PROVIDER_SITE_OTHER): Payer: Medicare Other | Admitting: *Deleted

## 2011-11-01 VITALS — BP 115/65 | HR 67 | Ht 69.0 in | Wt 175.1 lb

## 2011-11-01 DIAGNOSIS — F341 Dysthymic disorder: Secondary | ICD-10-CM

## 2011-11-01 DIAGNOSIS — E119 Type 2 diabetes mellitus without complications: Secondary | ICD-10-CM

## 2011-11-01 DIAGNOSIS — Z79899 Other long term (current) drug therapy: Secondary | ICD-10-CM

## 2011-11-01 DIAGNOSIS — E7800 Pure hypercholesterolemia, unspecified: Secondary | ICD-10-CM

## 2011-11-01 DIAGNOSIS — I209 Angina pectoris, unspecified: Secondary | ICD-10-CM

## 2011-11-01 DIAGNOSIS — I259 Chronic ischemic heart disease, unspecified: Secondary | ICD-10-CM

## 2011-11-01 DIAGNOSIS — I4891 Unspecified atrial fibrillation: Secondary | ICD-10-CM

## 2011-11-01 DIAGNOSIS — I119 Hypertensive heart disease without heart failure: Secondary | ICD-10-CM

## 2011-11-01 DIAGNOSIS — F419 Anxiety disorder, unspecified: Secondary | ICD-10-CM

## 2011-11-01 DIAGNOSIS — F418 Other specified anxiety disorders: Secondary | ICD-10-CM

## 2011-11-01 DIAGNOSIS — I10 Essential (primary) hypertension: Secondary | ICD-10-CM

## 2011-11-01 LAB — POCT INR: INR: 1.8

## 2011-11-01 NOTE — Assessment & Plan Note (Signed)
The patient has had a lot of anxiety and depression.  His symptoms have improved with the addition of citalopram and alprazolam.

## 2011-11-01 NOTE — Assessment & Plan Note (Signed)
Patient has a history of hypercholesterolemia.  He is unable to tolerate statins.  Statins cause him to have weak legs.  He has tried control his diet with low cholesterol foods

## 2011-11-01 NOTE — Progress Notes (Signed)
Benjamin Noble Date of Birth:  [birth year] [facility] / [facility] [address]   [address] [place], [place]  [address] [phone]           Fax   [phone]  History of Present Illness: This pleasant 77 year old gentleman is seen for a scheduled followup office visit.  He has an extensive past medical history.  He has known ischemic heart disease with severe angina pectoris.  He had coronary artery bypass graft surgery in 1984 and he had a redo CABG in 1992.  He is not a candidate for further invasive procedures.  He does have angina decubitus and has been on long-term Coumadin for ischemic heart disease.  He is in normal sinus rhythm.  He is diabetic and has a history of high cholesterol and high blood pressure.  He has been under a lot of stress caring for his wife who has dementia.  Current Outpatient Prescriptions  Medication Sig Dispense Refill  . ALPRAZolam (XANAX) 0.25 MG tablet Take 0.25 mg by mouth at bedtime as needed.        Marland Kitchen aspirin 81 MG chewable tablet Chew 1 tablet (81 mg total) by mouth daily.  90 tablet  3  . citalopram (CELEXA) 20 MG tablet Take 1 tablet (20 mg total) by mouth daily.  90 tablet  3  . docusate sodium (COLACE) 100 MG capsule Take 100 mg by mouth as directed.        . ferrous sulfate 325 (65 FE) MG tablet Take 325 mg by mouth daily with breakfast.       . furosemide (LASIX) 40 MG tablet TAKE ONE-HALF TABLET BY MOUTH EVERY DAY  30 tablet  11  . glimepiride (AMARYL) 4 MG tablet Take 1 tablet (4 mg total) by mouth daily before breakfast.  90 tablet  3  . isosorbide mononitrate (IMDUR) 30 MG 24 hr tablet Take 2 in the morning and 1 in the evening  270 tablet  3  . losartan (COZAAR) 50 MG tablet Take 1 tablet (50 mg total) by mouth daily.  90 tablet  3  . metFORMIN (GLUCOPHAGE) 500 MG tablet Take 500 mg by mouth as directed. 2 tablets in the morning and 1 tablet in the evening      . metoprolol (LOPRESSOR) 50 MG tablet Take 50 mg by mouth  3 (three) times daily.       . nitroGLYCERIN (NITROSTAT) 0.4 MG SL tablet Place 1 tablet (0.4 mg total) under the tongue every 5 (five) minutes as needed.  100 tablet  3  . pantoprazole (PROTONIX) 40 MG tablet Take 40 mg by mouth daily.        . promethazine (PHENERGAN) 25 MG tablet Take 1 tablet (25 mg total) by mouth daily as needed for nausea.  90 tablet  0  . warfarin (COUMADIN) 2 MG tablet One tablet Tuesday, Thursday, Saturday, and Sunday or as directed  90 tablet  3  . warfarin (COUMADIN) 5 MG tablet One tablet on Monday, Wednesday, and Friday or as directed  60 tablet  11    Allergies  Allergen Reactions  . Lipitor (Atorvastatin Calcium)     Gi issues  . Macrodantin   . Penicillins   . Ranexa     constipation  . Statins     Leg weakness    Patient Active Problem List  Diagnoses  . HBP (high blood pressure)  . S/P CABG (coronary artery bypass graft)  . Diabetes mellitus  .  Hypercholesterolemia  . Iron deficiency anemia  . Anxiety associated with depression  . Angina pectoris  . S/P CABG (coronary artery bypass graft)  . S/P CABG (coronary artery bypass graft)    History  Smoking status  . Never Smoker   Smokeless tobacco  . Not on file    History  Alcohol Use     No family history on file.  Review of Systems: Constitutional: no fever chills diaphoresis or fatigue or change in weight.  Head and neck: no hearing loss, no epistaxis, no photophobia or visual disturbance. Respiratory: No cough, shortness of breath or wheezing. Cardiovascular: No chest pain peripheral edema, palpitations. Gastrointestinal: No abdominal distention, no abdominal pain, no change in bowel habits hematochezia or melena. Genitourinary: No dysuria, no frequency, no urgency, no nocturia. Musculoskeletal:No arthralgias, no back pain, no gait disturbance or myalgias. Neurological: No dizziness, no headaches, no numbness, no seizures, no syncope, no weakness, no tremors. Hematologic: No  lymphadenopathy, no easy bruising. Psychiatric: No confusion, no hallucinations, no sleep disturbance.    Physical Exam: Filed Vitals:   11/01/11 1450  BP: 115/65  Pulse: 67   General appearance reveals an elderly gentleman who is in a wheelchair.Pupils equal and reactive.   Extraocular Movements are full.  There is no scleral icterus.  The mouth and pharynx are normal.  The neck is supple.  The carotids reveal no bruits.  The jugular venous pressure is normal.  The thyroid is not enlarged.  There is no lymphadenopathy.  The chest is clear to percussion and auscultation. There are no rales or rhonchi. Expansion of the chest is symmetrical.  The precordium is quiet.  The first heart sound is normal.  The second heart sound is physiologically split.  There is no murmur gallop rub or click.  There is no abnormal lift or heave.  The abdomen is soft and nontender. Bowel sounds are normal. The liver and spleen are not enlarged. There Are no abdominal masses. There are no bruits.  Extremities show no phlebitis or edema.  Pedal pulses are weak but present.The skin is warm and dry.  There is no rash. Strength is fair.  There is no lateralizing weakness.  There are no sensory deficits.    Assessment / Plan:  Continue same medication.  Recheck in 3 months for followup office visit CBC and basal metabolic panel

## 2011-11-01 NOTE — Assessment & Plan Note (Signed)
Recently the patient's blood pressure has been stable.  He is not having any symptoms of congestive heart failure or fluid retention.

## 2011-11-01 NOTE — Assessment & Plan Note (Signed)
The patient is diabetic.  He is not having any hypoglycemic episodes.

## 2011-11-01 NOTE — Patient Instructions (Signed)
Your physician recommends that you continue on your current medications as directed. Please refer to the Current Medication list given to you today. Your physician recommends that you schedule a follow-up appointment in: 3 months with lab (bmet/cbc)

## 2011-11-02 LAB — HEPATIC FUNCTION PANEL
ALT: 13 U/L (ref 0–53)
AST: 13 U/L (ref 0–37)
Albumin: 3.6 g/dL (ref 3.5–5.2)
Alkaline Phosphatase: 107 U/L (ref 39–117)
Bilirubin, Direct: 0.1 mg/dL (ref 0.0–0.3)
Total Bilirubin: 0.7 mg/dL (ref 0.3–1.2)
Total Protein: 7.5 g/dL (ref 6.0–8.3)

## 2011-11-02 LAB — BASIC METABOLIC PANEL
BUN: 32 mg/dL — ABNORMAL HIGH (ref 6–23)
CO2: 25 mEq/L (ref 19–32)
Calcium: 9 mg/dL (ref 8.4–10.5)
Chloride: 104 mEq/L (ref 96–112)
Creatinine, Ser: 1.5 mg/dL (ref 0.4–1.5)
GFR: 46.68 mL/min — ABNORMAL LOW (ref >60.00)
Glucose, Bld: 177 mg/dL — ABNORMAL HIGH (ref 70–99)
Potassium: 4.7 mEq/L (ref 3.5–5.1)
Sodium: 141 mEq/L (ref 135–145)

## 2011-11-02 LAB — HEMOGLOBIN A1C: Hgb A1c MFr Bld: 8.4 % — ABNORMAL HIGH (ref 4.6–6.5)

## 2011-11-05 NOTE — Telephone Encounter (Signed)
Follow up from previous call:  Patient son calling, aware that Juliette Alcide is out of office today.

## 2011-11-08 ENCOUNTER — Telehealth: Payer: Self-pay | Admitting: *Deleted

## 2011-11-08 NOTE — Telephone Encounter (Signed)
Advised of results

## 2011-11-08 NOTE — Telephone Encounter (Signed)
Message copied by Burnell Blanks on Mon Nov 08, 2011  4:01 PM ------      Message from: Cassell Clement      Created: Tue Nov 02, 2011  6:05 PM       Bl sugar and A1C higher.  Work harder on decrease carbs

## 2011-11-15 ENCOUNTER — Encounter: Payer: Medicare Other | Admitting: *Deleted

## 2011-11-22 ENCOUNTER — Encounter: Payer: Medicare Other | Admitting: *Deleted

## 2011-11-22 ENCOUNTER — Ambulatory Visit (INDEPENDENT_AMBULATORY_CARE_PROVIDER_SITE_OTHER): Payer: Medicare Other | Admitting: *Deleted

## 2011-11-22 DIAGNOSIS — I4891 Unspecified atrial fibrillation: Secondary | ICD-10-CM

## 2011-11-22 DIAGNOSIS — I209 Angina pectoris, unspecified: Secondary | ICD-10-CM

## 2011-12-07 ENCOUNTER — Telehealth: Payer: Self-pay | Admitting: Cardiology

## 2011-12-07 DIAGNOSIS — R05 Cough: Secondary | ICD-10-CM

## 2011-12-07 MED ORDER — AZITHROMYCIN 250 MG PO TABS: ORAL_TABLET | ORAL | Status: AC

## 2011-12-07 NOTE — Telephone Encounter (Signed)
Okay to call in a Z-Pak

## 2011-12-07 NOTE — Telephone Encounter (Signed)
Cough, head congestion, denies fever.  Would like Rx called in.

## 2011-12-07 NOTE — Telephone Encounter (Signed)
Advised son would call back only if  Dr. Patty Sermons didn't ok this to be called in

## 2011-12-07 NOTE — Telephone Encounter (Signed)
Pt [facility] [address]. I have explained to pt sone they will need to find a pcp because Dr. Patty Sermons will not be a pcp any longer he asked if we could call it in this time

## 2011-12-08 ENCOUNTER — Telehealth: Payer: Self-pay | Admitting: Cardiology

## 2011-12-08 NOTE — Telephone Encounter (Signed)
New message:  Son called requesting ZPak be called in.  Wanting to know if this has been done?  Please call and advise. [phone] for another 30 mins.

## 2011-12-08 NOTE — Telephone Encounter (Signed)
Sent Rx to [facility] and [facility].  Left message with son

## 2011-12-20 ENCOUNTER — Other Ambulatory Visit: Payer: Self-pay | Admitting: Cardiology

## 2011-12-20 ENCOUNTER — Ambulatory Visit (INDEPENDENT_AMBULATORY_CARE_PROVIDER_SITE_OTHER): Payer: Medicare Other | Admitting: *Deleted

## 2011-12-20 DIAGNOSIS — I209 Angina pectoris, unspecified: Secondary | ICD-10-CM

## 2011-12-20 DIAGNOSIS — I4891 Unspecified atrial fibrillation: Secondary | ICD-10-CM

## 2011-12-20 MED ORDER — WARFARIN SODIUM 2 MG PO TABS: ORAL_TABLET | ORAL | Status: DC

## 2011-12-23 ENCOUNTER — Other Ambulatory Visit: Payer: Self-pay | Admitting: Pharmacist

## 2011-12-23 DIAGNOSIS — I4891 Unspecified atrial fibrillation: Secondary | ICD-10-CM

## 2011-12-23 MED ORDER — WARFARIN SODIUM 5 MG PO TABS: ORAL_TABLET | ORAL | Status: DC

## 2011-12-27 ENCOUNTER — Other Ambulatory Visit: Payer: Self-pay | Admitting: Pharmacist

## 2011-12-27 DIAGNOSIS — I4891 Unspecified atrial fibrillation: Secondary | ICD-10-CM

## 2011-12-27 MED ORDER — WARFARIN SODIUM 5 MG PO TABS: ORAL_TABLET | ORAL | Status: DC

## 2011-12-29 ENCOUNTER — Telehealth: Payer: Self-pay | Admitting: Cardiology

## 2011-12-29 NOTE — Telephone Encounter (Signed)
[facility] [address] sent three faxes,no response, do not see request,calling in for refill on warfarin 5mg 

## 2011-12-31 ENCOUNTER — Telehealth: Payer: Self-pay | Admitting: Cardiology

## 2011-12-31 NOTE — Telephone Encounter (Signed)
Called pharmacy advised Warfarin 5mg  refill sent electronically on 12/27/11, #60 for 90 day supply with no refills. The did not receive escript will fill rx refill for pt now.

## 2011-12-31 NOTE — Telephone Encounter (Signed)
Verbal order given to [facility] at [facility] at 12pm today.

## 2011-12-31 NOTE — Telephone Encounter (Signed)
New msg Pt's son called about his warfarin 5 mg  [facility] [address]. It was not called in. He got warfarin 2 mg

## 2012-02-02 ENCOUNTER — Telehealth: Payer: Self-pay | Admitting: *Deleted

## 2012-02-02 ENCOUNTER — Telehealth: Payer: Self-pay | Admitting: Cardiology

## 2012-02-02 NOTE — Telephone Encounter (Signed)
Left message

## 2012-02-02 NOTE — Telephone Encounter (Signed)
Coughing, productive, fever yesterday and they feel bad.  Have not been tested for flu, son referring to it as cold.  Would like antibiotics phoned [facility] [address].  Will forward to  Dr. Patty Sermons

## 2012-02-02 NOTE — Telephone Encounter (Signed)
Benjamin Noble called because both his parents have the flu the will turn into pneumonia and he want a antibiotic for them

## 2012-02-02 NOTE — Telephone Encounter (Signed)
We can call in a Zithromax Z-Pak 

## 2012-02-02 NOTE — Telephone Encounter (Signed)
Opened in error

## 2012-02-03 ENCOUNTER — Emergency Department (HOSPITAL_COMMUNITY)
Admission: EM | Admit: 2012-02-03 | Discharge: 2012-02-03 | Disposition: A | Discharge status: Home / Self Care | Payer: Medicare Other | Attending: Emergency Medicine | Admitting: Emergency Medicine

## 2012-02-03 ENCOUNTER — Emergency Department (HOSPITAL_COMMUNITY): Payer: Medicare Other

## 2012-02-03 ENCOUNTER — Encounter (HOSPITAL_COMMUNITY): Payer: Self-pay | Admitting: *Deleted

## 2012-02-03 DIAGNOSIS — E7800 Pure hypercholesterolemia, unspecified: Secondary | ICD-10-CM | POA: Insufficient documentation

## 2012-02-03 DIAGNOSIS — R079 Chest pain, unspecified: Secondary | ICD-10-CM | POA: Insufficient documentation

## 2012-02-03 DIAGNOSIS — R0781 Pleurodynia: Secondary | ICD-10-CM

## 2012-02-03 DIAGNOSIS — R059 Cough, unspecified: Secondary | ICD-10-CM | POA: Insufficient documentation

## 2012-02-03 DIAGNOSIS — I509 Heart failure, unspecified: Secondary | ICD-10-CM | POA: Insufficient documentation

## 2012-02-03 DIAGNOSIS — I10 Essential (primary) hypertension: Secondary | ICD-10-CM | POA: Insufficient documentation

## 2012-02-03 DIAGNOSIS — E119 Type 2 diabetes mellitus without complications: Secondary | ICD-10-CM | POA: Insufficient documentation

## 2012-02-03 DIAGNOSIS — I259 Chronic ischemic heart disease, unspecified: Secondary | ICD-10-CM | POA: Insufficient documentation

## 2012-02-03 DIAGNOSIS — R05 Cough: Secondary | ICD-10-CM | POA: Insufficient documentation

## 2012-02-03 LAB — CBC
HCT: 40.3 % (ref 39.0–52.0)
MCH: 30.9 pg (ref 26.0–34.0)
MCV: 89.6 fL (ref 78.0–100.0)
Platelets: 230 10*3/uL (ref 150–400)
RBC: 4.5 MIL/uL (ref 4.22–5.81)

## 2012-02-03 LAB — DIFFERENTIAL
Eosinophils Absolute: 0.2 10*3/uL (ref 0.0–0.7)
Eosinophils Relative: 3 % (ref 0–5)
Lymphocytes Relative: 19 % (ref 12–46)
Lymphs Abs: 1.2 10*3/uL (ref 0.7–4.0)
Monocytes Absolute: 0.8 10*3/uL (ref 0.1–1.0)
Monocytes Relative: 14 % — ABNORMAL HIGH (ref 3–12)

## 2012-02-03 LAB — BASIC METABOLIC PANEL
BUN: 28 mg/dL — ABNORMAL HIGH (ref 6–23)
CO2: 26 mEq/L (ref 19–32)
Calcium: 9.2 mg/dL (ref 8.4–10.5)
Creatinine, Ser: 1.18 mg/dL (ref 0.50–1.35)
GFR calc non Af Amer: 54 mL/min — ABNORMAL LOW (ref >90)
Glucose, Bld: 90 mg/dL (ref 70–99)
Sodium: 138 mEq/L (ref 135–145)

## 2012-02-03 LAB — PROTIME-INR: Prothrombin Time: 15.7 seconds — ABNORMAL HIGH (ref 11.6–15.2)

## 2012-02-03 NOTE — ED Provider Notes (Signed)
History     CSN: [id number]  Arrival date & time 02/03/12  1131   First MD Initiated Contact with Patient 02/03/12 1154      Chief Complaint  Patient presents with  . Muscle Pain    (Consider location/radiation/quality/duration/timing/severity/associated sxs/prior treatment) HPI Comments: Patient with a history of CHF, CAD, and hypertension, and diabetes presents emergency department with right-sided rib pain.  Patient states that earlier today he was helping his wife move from a chair and that he feels he strained a muscle.  Pain is aggravated by certain movements and positions.  Patient did not report any alleviating factors.  Patient denies chest pain, shortness of breath,edema, weakness, abdominal pain, N/V/D, fevers night sweats or chills.  Patient states that he has a chronic history of fractured sternal wires, however he has been asymptomatic with this.  Patient was told if he became symptomatic to contact his PCP and cardiologist Dr. Patty Sermons.  Patient states concerned that possibly this is causing the rib pain.  Patient describes the pain as a 4/10 in severity and denies radiation.  Patient has no other complaints.  Patient is a 78 y.o. male presenting with musculoskeletal pain. The history is provided by the patient.  Muscle Pain This is a new problem. The current episode started today. The problem occurs constantly. The problem has been unchanged. Associated symptoms include coughing (since yresterday, non productive.). Pertinent negatives include no abdominal pain, anorexia, arthralgias, change in bowel habit, chest pain, chills, congestion, diaphoresis, fatigue, fever, headaches, joint swelling, myalgias, nausea, neck pain, numbness, rash, sore throat, swollen glands, urinary symptoms, vertigo, visual change, vomiting or weakness. Exacerbated by: certain positions. He has tried nothing for the symptoms.    Past Medical History  Diagnosis Date  . Ischemic heart disease   . HBP  (high blood pressure)   . Angina pectoris   . Diabetes mellitus   . Hypercholesterolemia   . CHF (congestive heart failure)     hx of  . Arthritis     Past Surgical History  Procedure Date  . Cardiac catheterization 03/18/1998  . Coronary artery bypass graft 4098,1191    first at [facility]  . Circumcision 02/05/08    History reviewed. No pertinent family history.  History  Substance Use Topics  . Smoking status: Never Smoker   . Smokeless tobacco: Not on file  . Alcohol Use: No      Review of Systems  Constitutional: Negative for fever, chills, diaphoresis and fatigue.  HENT: Negative for congestion, sore throat and neck pain.   Respiratory: Positive for cough (since yresterday, non productive.). Negative for chest tightness, shortness of breath, wheezing and stridor.   Cardiovascular: Negative for chest pain.  Gastrointestinal: Negative for nausea, vomiting, abdominal pain, anorexia and change in bowel habit.  Musculoskeletal: Negative for myalgias, joint swelling and arthralgias.  Skin: Negative for rash.  Neurological: Negative for vertigo, weakness, numbness and headaches.  All other systems reviewed and are negative.    Allergies  Penicillins; Lipitor; Macrodantin; Ranexa; and Statins  Home Medications   Current Outpatient Rx  Name Route Sig Dispense Refill  . ALPRAZOLAM 0.25 MG PO TABS Oral Take 0.25 mg by mouth at bedtime as needed. For anxiety    . ASPIRIN 81 MG PO CHEW Oral Chew 81 mg by mouth daily.    Marland Kitchen DOCUSATE SODIUM 100 MG PO CAPS Oral Take 100 mg by mouth daily.     Marland Kitchen FERROUS SULFATE 325 (65 FE) MG PO TABS Oral Take  325 mg by mouth daily with breakfast.     . FUROSEMIDE 40 MG PO TABS Oral Take 20 mg by mouth daily.    Marland Kitchen GLIMEPIRIDE 4 MG PO TABS Oral Take 4 mg by mouth daily before breakfast.    . ISOSORBIDE MONONITRATE ER 30 MG PO TB24 Oral Take 30-60 mg by mouth 2 (two) times daily. Take 2 in the morning and 1 in the evening    . LOSARTAN  POTASSIUM 50 MG PO TABS Oral Take 50 mg by mouth daily.    Marland Kitchen METFORMIN HCL 500 MG PO TABS Oral Take 500-1,000 mg by mouth as directed. 2 tablets in the morning and 1 tablet in the evening    . METOPROLOL TARTRATE 50 MG PO TABS Oral Take 50-100 mg by mouth 3 (three) times daily. 2 in the morning and 1 at night    . NITROGLYCERIN 0.4 MG SL SUBL Sublingual Place 0.4 mg under the tongue every 5 (five) minutes as needed. For chest pain.    Marland Kitchen PANTOPRAZOLE SODIUM 40 MG PO TBEC Oral Take 40 mg by mouth daily.      . WARFARIN SODIUM 2 MG PO TABS Oral Take 2 mg by mouth daily. Tuesday, Thursday, Saturday, and Sunday    . WARFARIN SODIUM 5 MG PO TABS Oral Take 5 mg by mouth daily. Monday, Wednesday, Friday      BP 170/96  Pulse 76  Temp(Src) 97.9 F (36.6 C) (Oral)  Resp 20  SpO2 96%  Physical Exam  Nursing note and vitals reviewed. Constitutional: He appears well-developed and well-nourished. No distress.  HENT:  Head: Normocephalic and atraumatic.  Eyes: Conjunctivae and EOM are normal. Pupils are equal, round, and reactive to light.  Neck: Normal range of motion. Neck supple. Normal carotid pulses and no JVD present. Carotid bruit is not present. No rigidity. Normal range of motion present.  Cardiovascular: Normal rate, regular rhythm, S1 normal, S2 normal, normal heart sounds, intact distal pulses and normal pulses.  Exam reveals no gallop and no friction rub.   No murmur heard.      No pitting edema bilaterally, RRR, no aberrant sounds on auscultations, distal pulses intact, no carotid bruit or JVD.   Pulmonary/Chest: Effort normal and breath sounds normal. No accessory muscle usage or stridor. No respiratory distress. He exhibits no tenderness and no bony tenderness.  Abdominal: Bowel sounds are normal.       Soft non tender. Non pulsatile aorta.   Musculoskeletal:       No ttp of ribs or muscular surroundings.   Skin: Skin is warm, dry and intact. No rash noted. He is not diaphoretic. No  cyanosis. Nails show no clubbing.    ED Course  Procedures (including critical care time)  Labs Reviewed  PROTIME-INR - Abnormal; Notable for the following:    Prothrombin Time 15.7 (*)    All other components within normal limits  DIFFERENTIAL - Abnormal; Notable for the following:    Monocytes Relative 14 (*)    All other components within normal limits  BASIC METABOLIC PANEL - Abnormal; Notable for the following:    BUN 28 (*)    GFR calc non Af Amer 54 (*)    GFR calc Af Amer 62 (*)    All other components within normal limits  APTT  CBC   Dg Chest 2 View  02/03/2012  *RADIOLOGY REPORT*  Clinical Data: Cough.  Lower right rib pain.  CHEST - 2 VIEW  Comparison: 01/31/2008.  Findings: Calcified pleura left lung base as noted previously.  Post mediastinotomy with multiple fractured sternal wires.  Heart size top normal.  Calcified aorta.  Central pulmonary vascular prominence.  No segmental infiltrate or pneumothorax.  IMPRESSION: Calcified pleura left lung base as noted previously.  Post mediastinotomy with multiple fractured sternal wires.  Heart size top normal.  Calcified aorta.  Central pulmonary vascular prominence.  No segmental infiltrate.  Original Report Authenticated By: Fuller Canada, M.D.     No diagnosis found.  BP 170/96  Pulse 76  Temp(Src) 97.9 F (36.6 C) (Oral)  Resp 20  SpO2 96%   MDM  Rib pain, likely muscular etiology  Patient's results have been discussed with him.  Patient states she will followup with Dr. in regards to today's hospital visit.  Patient has been hemodynamically stable, in no acute distress, and without pain throughout hospital visit.  PE is unlikely do to patient being asymptomatic with stable vital signs. Pt has been advised to continue taking his coumadin again, INR was subtherapeutic at 1.27.  I also discussed reasons to return to the emergency department with the patient including if he feels like his heart is racing he has  shortness of breath or if he begins to develop chest pain.  Patient is agreeable with plan.  This patient was seen and discussed with Dr. Ranae Palms who agrees with above plan.      Jaci Carrel, [place] 02/03/12 1545

## 2012-02-03 NOTE — Discharge Instructions (Signed)
Read instructions below for reasons to return to the Emergency Department. It is recommended that your follow up with your Primary Care Doctor in regards to today's visit. If you do not have a doctor, use the resource guide listed below to help you find one.  Start taking your coumadin again and follow up with Dr. Patty Sermons.   HOME CARE INSTRUCTIONS  For the next few days, avoid physical activities that bring on rib pain. Continue physical activities as directed.  Do not smoke cigarettes or drink alcohol until your symptoms are gone.  Only take over-the-counter or prescription medicine for pain, discomfort, or fever as directed by your caregiver.  Follow your caregiver's suggestions for further testing if your chest pain does not go away.  Keep any follow-up appointments you made. If you do not go to an appointment, you could develop lasting (chronic) problems with pain. If there is any problem keeping an appointment, you must call to reschedule.  SEEK MEDICAL CARE IF:  You think you are having problems from the medicine you are taking. Read your medicine instructions carefully.  Your chest pain does not go away, even after treatment.  You develop a rash with blisters on your chest.  SEEK IMMEDIATE MEDICAL CARE IF:  You have increased chest pain or pain that spreads to your arm, neck, jaw, back, or belly (abdomen).  You develop shortness of breath, an increasing cough, or you are coughing up blood.  You have severe back or abdominal pain, feel sick to your stomach (nauseous) or throw up (vomit).  You develop severe weakness, fainting, or chills.  You have an oral temperature above 102 F (38.9 C), not controlled by medicine.   THIS IS AN EMERGENCY. Do not wait to see if the pain will go away. Get medical help at once. Call your local emergency services (911 in [place] Do not drive yourself to the hospital.

## 2012-02-03 NOTE — ED Notes (Signed)
EMS-pt with sharp pain to right rib cage. Pt reports he had open heart surgery approx 10 years ago and the wire had moved. Pt reports he has been coughing and he lifted his wife. States he feels like the wire is the cause of the discomfort. Denies any pain at this time.

## 2012-02-03 NOTE — Telephone Encounter (Signed)
Will call back.

## 2012-02-03 NOTE — Telephone Encounter (Signed)
Per daughter, patient in ED for sharp chest pain

## 2012-02-03 NOTE — Telephone Encounter (Signed)
Follow up on previous call:  Patient son calling regarding a rx to be called in

## 2012-02-07 ENCOUNTER — Other Ambulatory Visit: Payer: Medicare Other | Admitting: *Deleted

## 2012-02-07 ENCOUNTER — Ambulatory Visit: Payer: Medicare Other | Admitting: Cardiology

## 2012-02-08 NOTE — Telephone Encounter (Signed)
Family never called back and cancelled appointment yesterday

## 2012-02-14 ENCOUNTER — Ambulatory Visit: Payer: Medicare Other

## 2012-02-28 NOTE — ED Provider Notes (Signed)
Medical screening examination/treatment/procedure(s) were conducted as a shared visit with non-physician practitioner(s) and myself.  I personally evaluated the patient during the encounter   Loren Racer, MD 02/28/12 1538

## 2012-03-20 DIAGNOSIS — I609 Nontraumatic subarachnoid hemorrhage, unspecified: Secondary | ICD-10-CM

## 2012-03-20 HISTORY — DX: Nontraumatic subarachnoid hemorrhage, unspecified: I60.9

## 2012-03-28 ENCOUNTER — Other Ambulatory Visit: Payer: Self-pay | Admitting: *Deleted

## 2012-04-05 ENCOUNTER — Emergency Department (HOSPITAL_COMMUNITY): Payer: Medicare Other

## 2012-04-05 ENCOUNTER — Inpatient Hospital Stay (HOSPITAL_COMMUNITY)
Admission: EM | Admit: 2012-04-05 | Discharge: 2012-04-08 | DRG: 086 | Disposition: A | Discharge status: Home Health | Payer: Medicare Other | Attending: Internal Medicine | Admitting: Internal Medicine

## 2012-04-05 ENCOUNTER — Encounter (HOSPITAL_COMMUNITY): Payer: Self-pay

## 2012-04-05 DIAGNOSIS — I509 Heart failure, unspecified: Secondary | ICD-10-CM | POA: Diagnosis present

## 2012-04-05 DIAGNOSIS — M129 Arthropathy, unspecified: Secondary | ICD-10-CM | POA: Diagnosis present

## 2012-04-05 DIAGNOSIS — Z66 Do not resuscitate: Secondary | ICD-10-CM | POA: Diagnosis present

## 2012-04-05 DIAGNOSIS — E119 Type 2 diabetes mellitus without complications: Secondary | ICD-10-CM | POA: Diagnosis present

## 2012-04-05 DIAGNOSIS — I454 Nonspecific intraventricular block: Secondary | ICD-10-CM | POA: Diagnosis present

## 2012-04-05 DIAGNOSIS — K219 Gastro-esophageal reflux disease without esophagitis: Secondary | ICD-10-CM | POA: Diagnosis present

## 2012-04-05 DIAGNOSIS — Z7982 Long term (current) use of aspirin: Secondary | ICD-10-CM

## 2012-04-05 DIAGNOSIS — Z7901 Long term (current) use of anticoagulants: Secondary | ICD-10-CM

## 2012-04-05 DIAGNOSIS — R42 Dizziness and giddiness: Secondary | ICD-10-CM | POA: Diagnosis present

## 2012-04-05 DIAGNOSIS — I609 Nontraumatic subarachnoid hemorrhage, unspecified: Secondary | ICD-10-CM

## 2012-04-05 DIAGNOSIS — S066X0A Traumatic subarachnoid hemorrhage without loss of consciousness, initial encounter: Principal | ICD-10-CM | POA: Diagnosis present

## 2012-04-05 DIAGNOSIS — W19XXXA Unspecified fall, initial encounter: Secondary | ICD-10-CM | POA: Diagnosis present

## 2012-04-05 DIAGNOSIS — R55 Syncope and collapse: Secondary | ICD-10-CM | POA: Diagnosis present

## 2012-04-05 DIAGNOSIS — Z951 Presence of aortocoronary bypass graft: Secondary | ICD-10-CM

## 2012-04-05 DIAGNOSIS — Z880 Allergy status to penicillin: Secondary | ICD-10-CM

## 2012-04-05 DIAGNOSIS — Z79899 Other long term (current) drug therapy: Secondary | ICD-10-CM

## 2012-04-05 DIAGNOSIS — Z888 Allergy status to other drugs, medicaments and biological substances status: Secondary | ICD-10-CM

## 2012-04-05 DIAGNOSIS — I5022 Chronic systolic (congestive) heart failure: Secondary | ICD-10-CM | POA: Diagnosis present

## 2012-04-05 DIAGNOSIS — E7800 Pure hypercholesterolemia, unspecified: Secondary | ICD-10-CM | POA: Diagnosis present

## 2012-04-05 DIAGNOSIS — N182 Chronic kidney disease, stage 2 (mild): Secondary | ICD-10-CM | POA: Diagnosis present

## 2012-04-05 DIAGNOSIS — I2510 Atherosclerotic heart disease of native coronary artery without angina pectoris: Secondary | ICD-10-CM | POA: Diagnosis present

## 2012-04-05 DIAGNOSIS — I10 Essential (primary) hypertension: Secondary | ICD-10-CM | POA: Diagnosis present

## 2012-04-05 HISTORY — DX: Cardiac arrhythmia, unspecified: I49.9

## 2012-04-05 HISTORY — DX: Essential (primary) hypertension: I10

## 2012-04-05 HISTORY — DX: Shortness of breath: R06.02

## 2012-04-05 HISTORY — DX: Depression, unspecified: F32.A

## 2012-04-05 HISTORY — DX: Myoneural disorder, unspecified: G70.9

## 2012-04-05 HISTORY — DX: Major depressive disorder, single episode, unspecified: F32.9

## 2012-04-05 HISTORY — DX: Acute myocardial infarction, unspecified: I21.9

## 2012-04-05 HISTORY — DX: Gastro-esophageal reflux disease without esophagitis: K21.9

## 2012-04-05 LAB — CBC
HCT: 41.1 % (ref 39.0–52.0)
MCH: 31.1 pg (ref 26.0–34.0)
MCV: 90.7 fL (ref 78.0–100.0)
RBC: 4.53 MIL/uL (ref 4.22–5.81)
RDW: 13.1 % (ref 11.5–15.5)
WBC: 6.2 10*3/uL (ref 4.0–10.5)

## 2012-04-05 LAB — CARDIAC PANEL(CRET KIN+CKTOT+MB+TROPI)
CK, MB: 1.7 ng/mL (ref 0.3–4.0)
Relative Index: INVALID (ref 0.0–2.5)
Total CK: 22 U/L (ref 7–232)

## 2012-04-05 LAB — COMPREHENSIVE METABOLIC PANEL
CO2: 26 mEq/L (ref 19–32)
Calcium: 9.2 mg/dL (ref 8.4–10.5)
Creatinine, Ser: 1.22 mg/dL (ref 0.50–1.35)
GFR calc Af Amer: 60 mL/min — ABNORMAL LOW (ref >90)
GFR calc non Af Amer: 51 mL/min — ABNORMAL LOW (ref >90)
Glucose, Bld: 121 mg/dL — ABNORMAL HIGH (ref 70–99)
Total Protein: 7.1 g/dL (ref 6.0–8.3)

## 2012-04-05 LAB — DIFFERENTIAL
Eosinophils Absolute: 0.1 10*3/uL (ref 0.0–0.7)
Eosinophils Relative: 2 % (ref 0–5)
Lymphocytes Relative: 19 % (ref 12–46)
Lymphs Abs: 1.2 10*3/uL (ref 0.7–4.0)
Monocytes Absolute: 0.9 10*3/uL (ref 0.1–1.0)

## 2012-04-05 LAB — URINALYSIS, ROUTINE W REFLEX MICROSCOPIC
Bilirubin Urine: NEGATIVE
Glucose, UA: 250 mg/dL — AB
Ketones, ur: NEGATIVE mg/dL
Leukocytes, UA: NEGATIVE
pH: 5.5 (ref 5.0–8.0)

## 2012-04-05 LAB — POCT I-STAT TROPONIN I: Troponin i, poc: 0.02 ng/mL (ref 0.00–0.08)

## 2012-04-05 LAB — URINE MICROSCOPIC-ADD ON

## 2012-04-05 MED ORDER — ACETAMINOPHEN 325 MG PO TABS: 650.0000 mg | ORAL_TABLET | Freq: Four times a day (QID) | ORAL | Status: DC | PRN

## 2012-04-05 MED ORDER — PANTOPRAZOLE SODIUM 40 MG PO TBEC
40.0000 mg | DELAYED_RELEASE_TABLET | Freq: Every day | ORAL | Status: DC
  Administered 2012-04-05 – 2012-04-08 (×4): 40 mg via ORAL
  Filled 2012-04-05 (×4): qty 1

## 2012-04-05 MED ORDER — SODIUM CHLORIDE 0.9 % IV BOLUS (SEPSIS)
500.0000 mL | Freq: Once | INTRAVENOUS | Status: AC
  Administered 2012-04-05: 500 mL via INTRAVENOUS

## 2012-04-05 MED ORDER — SODIUM CHLORIDE 0.9 % IV SOLN: INTRAVENOUS | Status: DC

## 2012-04-05 MED ORDER — OXYCODONE HCL 5 MG PO TABS: 5.0000 mg | ORAL_TABLET | ORAL | Status: DC | PRN

## 2012-04-05 MED ORDER — INSULIN ASPART 100 UNIT/ML ~~LOC~~ SOLN
0.0000 [IU] | Freq: Three times a day (TID) | SUBCUTANEOUS | Status: DC
  Administered 2012-04-06: 3 [IU] via SUBCUTANEOUS
  Administered 2012-04-06: 5 [IU] via SUBCUTANEOUS
  Administered 2012-04-06 – 2012-04-07 (×3): 1 [IU] via SUBCUTANEOUS
  Administered 2012-04-07: 3 [IU] via SUBCUTANEOUS

## 2012-04-05 MED ORDER — ALPRAZOLAM 0.25 MG PO TABS
0.2500 mg | ORAL_TABLET | Freq: Two times a day (BID) | ORAL | Status: DC | PRN
  Administered 2012-04-05: 0.25 mg via ORAL
  Filled 2012-04-05: qty 1

## 2012-04-05 MED ORDER — ONDANSETRON HCL 4 MG/2ML IJ SOLN: 4.0000 mg | Freq: Four times a day (QID) | INTRAMUSCULAR | Status: DC | PRN

## 2012-04-05 MED ORDER — ONDANSETRON HCL 4 MG PO TABS: 4.0000 mg | ORAL_TABLET | Freq: Four times a day (QID) | ORAL | Status: DC | PRN

## 2012-04-05 MED ORDER — SODIUM CHLORIDE 0.9 % IJ SOLN
3.0000 mL | Freq: Two times a day (BID) | INTRAMUSCULAR | Status: DC
  Administered 2012-04-07: 3 mL via INTRAVENOUS

## 2012-04-05 MED ORDER — DOCUSATE SODIUM 100 MG PO CAPS
100.0000 mg | ORAL_CAPSULE | Freq: Every day | ORAL | Status: DC
  Administered 2012-04-05 – 2012-04-08 (×4): 100 mg via ORAL
  Filled 2012-04-05 (×3): qty 1

## 2012-04-05 MED ORDER — ACETAMINOPHEN 650 MG RE SUPP: 650.0000 mg | Freq: Four times a day (QID) | RECTAL | Status: DC | PRN

## 2012-04-05 MED ORDER — LOSARTAN POTASSIUM 50 MG PO TABS
50.0000 mg | ORAL_TABLET | Freq: Every day | ORAL | Status: DC
  Administered 2012-04-05 – 2012-04-08 (×4): 50 mg via ORAL
  Filled 2012-04-05 (×5): qty 1

## 2012-04-05 MED ORDER — SODIUM CHLORIDE 0.9 % IJ SOLN: 3.0000 mL | INTRAMUSCULAR | Status: DC | PRN

## 2012-04-05 MED ORDER — HYDRALAZINE HCL 20 MG/ML IJ SOLN
5.0000 mg | INTRAMUSCULAR | Status: DC | PRN
  Filled 2012-04-05: qty 0.25

## 2012-04-05 MED ORDER — SODIUM CHLORIDE 0.9 % IJ SOLN
3.0000 mL | Freq: Two times a day (BID) | INTRAMUSCULAR | Status: DC
  Administered 2012-04-06 (×2): 3 mL via INTRAVENOUS

## 2012-04-05 NOTE — H&P (Signed)
Hospital Admission Note Date: 04/05/2012  Patient name: Benjamin Noble Medical record number: [id number] Date of birth: 1922/09/28 Age: 78 y.o. Gender: male PCP: Cassell Clement, MD, MD  Medical Service: Internal Medicine Teaching Service  Attending physician: Blanch Media    1st Contact: Benjamin Noble   Pager: [phone] 2nd Contact: Benjamin Noble   Pager: [phone] After 5 pm or weekends: 1st Contact:      Pager: [phone] 2nd Contact:      Pager: [phone]  Chief Complaint: Dizziness, fall  History of Present Illness: The patient is an 78 yo man, history of HTN, DM (A1C = 8.4), HL, CHF (EF 55-60%), CAD (s/p CABG x2), ?afib on coumadin, presenting with dizziness following a fall.  The patient was in his normal state of health 6 days PTA, when, after not eating since breakfast, the patient fell around 5pm while walking back from the bathroom, not using his walker.  He notes no preceding symptoms of dizziness, LH, altered sensorium, visual changes, or unusual taste in his mouth, and no tongue biting, loss of bowel contents, or confusion following the event (incontinent of bladder at baseline).  The patient's sitter heard the patient fall, and found him on his back on the floor.  The patient did not seek medical attention.  Since that time, he has noted "dizziness", described as a sensation of the room spinning, along with symptoms of mild nausea, though no vomiting.  He noted a posterior headache on the day of the event, but no headache since that time.  No focal weakness/numbness, vision/hearing changes, slurring of speech.  On presentation to the ED, head CT shows a small subarachnoid left frontal bleed.  The patient is maintained on aspirin and warfarin, though notes occasional non-compliance with warfarin, and presents with an INR of 1.18.  Meds: Medications Prior to Admission  Medication Dose Route Frequency Provider Last Rate Last Dose  . 0.9 %  sodium chloride infusion   Intravenous  Continuous Thomasene Lot, PA-C 125 mL/hr at 04/05/12 1332 500 mL at 04/05/12 1332  . sodium chloride 0.9 % bolus 500 mL  500 mL Intravenous Once Thomasene Lot, PA-C   500 mL at 04/05/12 1332   Medications Prior to Admission  Medication Sig Dispense Refill  . ALPRAZolam (XANAX) 0.25 MG tablet Take 0.25 mg by mouth at bedtime as needed. For anxiety      . aspirin 81 MG chewable tablet Chew 81 mg by mouth daily as needed. For angina      . docusate sodium (COLACE) 100 MG capsule Take 100 mg by mouth daily.       . ferrous sulfate 325 (65 FE) MG tablet Take 325 mg by mouth daily with breakfast.       . furosemide (LASIX) 40 MG tablet Take 20 mg by mouth daily.      Marland Kitchen glimepiride (AMARYL) 4 MG tablet Take 4 mg by mouth daily before breakfast.      . isosorbide mononitrate (IMDUR) 30 MG 24 hr tablet Take 30-60 mg by mouth 2 (two) times daily. Take 2 in the morning and 1 in the evening      . metFORMIN (GLUCOPHAGE) 500 MG tablet Take 500-1,000 mg by mouth 2 (two) times daily. 2 tablets in the morning and 1 tablet in the evening      . metoprolol (LOPRESSOR) 50 MG tablet Take 50-100 mg by mouth 2 (two) times daily. 2 in the morning and 1 at night      .  nitroGLYCERIN (NITROSTAT) 0.4 MG SL tablet Place 0.4 mg under the tongue every 5 (five) minutes as needed. For chest pain.      . pantoprazole (PROTONIX) 40 MG tablet Take 40 mg by mouth daily.        Marland Kitchen warfarin (COUMADIN) 2 MG tablet Take 2 mg by mouth every Tuesday, Thursday, Saturday, and Sunday at 6 PM.       . warfarin (COUMADIN) 5 MG tablet Take 5 mg by mouth every Monday, Wednesday, and Friday.         Allergies: Allergies as of 04/05/2012 - Review Complete 04/05/2012  Allergen Reaction Noted  . Penicillins Anaphylaxis and Swelling 01/23/2011  . Lipitor (atorvastatin calcium) Other (See Comments) 01/23/2011  . Macrodantin Other (See Comments) 01/23/2011  . Morphine and related Other (See Comments) 04/05/2012  . Ranexa Other (See  Comments) 01/23/2011  . Statins Other (See Comments) 01/23/2011   Past Medical History  Diagnosis Date  . Ischemic heart disease   . HTN (hypertension)   . Angina pectoris   . Diabetes mellitus   . Hypercholesterolemia   . CHF (congestive heart failure)     hx of  . Arthritis   . Urinary incontinence   . CAD (coronary artery disease)     s/p CABG 1984, 1992. Patient declines any further CABG.   Past Surgical History  Procedure Date  . Cardiac catheterization 03/18/1998  . Coronary artery bypass graft [id number]    first at [facility]  . Circumcision 02/05/08  . Eye surgery    Family History  Problem Relation Age of Onset  . Heart attack Father    History   Social History  . Marital Status: Married    Spouse Name: N/A    Number of Children: N/A  . Years of Education: N/A   Occupational History  . Not on file.   Social History Main Topics  . Smoking status: Never Smoker   . Smokeless tobacco: Not on file  . Alcohol Use: No  . Drug Use: No  . Sexually Active: Not on file   Other Topics Concern  . Not on file   Social History Narrative   The patient lives at home with his wife, who has alzheimer's.  They have a 24-hr sitter in the home, and close family contacts nearby.  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    Review of Systems: General: no fevers, chills, changes in weight, changes in appetite Skin: no rash HEENT: no blurry vision, hearing changes, sore throat Pulm: no dyspnea, coughing, wheezing CV: no chest pain, palpitations, shortness of breath Abd: no abdominal pain, nausea/vomiting, diarrhea/constipation GU: +urinary incontinence Ext: no arthralgias, myalgias Neuro: no weakness, numbness, or tingling  Physical Exam: Blood pressure 180/88, pulse 70, temperature 98.4 F (36.9 C), temperature source Oral, resp. rate 17, SpO2 98.00%. General: alert, cooperative, and in no apparent distress HEENT: pupils equal round and  reactive to light, EOMI, vision grossly intact, oropharynx clear and non-erythematous  Neck: supple, no lymphadenopathy, no JVD Lungs: clear to ascultation bilaterally, normal work of respiration, no wheezes, rales, ronchi Heart: regular rate and rhythm, no murmurs, gallops, or rubs Abdomen: soft, non-tender, non-distended, normal bowel sounds Extremities: no cyanosis, clubbing, or edema Neurologic: alert & oriented X3, cranial nerves II-XII intact, strength 5/5 throughout, sensation intact to light touch throughout, finger-to-nose test normal   Lab results: Basic Metabolic Panel:  Basename 04/05/12 1300  NA 138  K 4.4  CL 101  CO2 26  GLUCOSE 121*  BUN 32*  CREATININE 1.22  CALCIUM 9.2  MG --  PHOS --   Liver Function Tests:  Basename 04/05/12 1300  AST 15  ALT 7  ALKPHOS 99  BILITOT 0.5  PROT 7.1  ALBUMIN 3.3*   CBC:  Basename 04/05/12 1300  WBC 6.2  NEUTROABS 3.9  HGB 14.1  HCT 41.1  MCV 90.7  PLT 215   Coagulation:  Basename 04/05/12 1300  LABPROT 15.3*  INR 1.18   Urinalysis:  Basename 04/05/12 1259  COLORURINE YELLOW  LABSPEC 1.020  PHURINE 5.5  GLUCOSEU 250*  HGBUR TRACE*  BILIRUBINUR NEGATIVE  KETONESUR NEGATIVE  PROTEINUR 30*  UROBILINOGEN 1.0  NITRITE NEGATIVE  LEUKOCYTESUR NEGATIVE    Imaging results:  Dg Chest 2 View  04/05/2012  *RADIOLOGY REPORT*  Clinical Data: Dizziness, weakness.  CHEST - 2 VIEW  Comparison: None  Findings: Prior median sternotomy.  Heart is borderline in size. Mild peribronchial thickening.  Scarring in the left lung base.  No confluent opacity in the right.  No effusions or acute bony abnormality.  IMPRESSION: Bronchitic changes.  Scarring in the left base.  Borderline heart size.  Original Report Authenticated By: Cyndie Chime, M.D.   Ct Head Wo Contrast  04/05/2012  *RADIOLOGY REPORT*  Clinical Data: Nausea, vomiting, fall.  CT HEAD WITHOUT CONTRAST  Technique:  Contiguous axial images were obtained from  the base of the skull through the vertex without contrast.  Comparison: None.  Findings: There is a small amount of blood seen in the left frontal region.  This appears to be within sulci compatible with subarachnoid blood.  There is severe atrophy.  Moderate chronic small vessel disease changes.  Ventriculomegaly noted which I suspect is related to ex vacuo dilatation.  No mass effect or midline shift.  No intraparenchymal hemorrhage.  No acute bony abnormality.  IMPRESSION: Small amount of blood in the left frontal region which appears to be extra-axial, likely subarachnoid.  Critical Value/emergent results were called by telephone at the time of interpretation on 04/05/2012  at 2:10 p.m.  to  Dr. Estell Harpin, who verbally acknowledged these results.  Original Report Authenticated By: Cyndie Chime, M.D.    Other results: EKG: NSR, widened QRS with RBBB (old), TWI and ST depression in V1-V2 with no reciprocal changes  Assessment & Plan by Problem: The patient is an 78 yo man, history of DM, CAD,?afib, coumadin use (inconsistent), presenting 6 days s/p fall, with small SAH.  # Subarachnoid Hemorrhage - CT head shows small SAH in left frontal lobe, likely secondary to a fall 6 days earlier, with aspirin and coumadin usage (inconsistent usage, INR = 1.18 today). -neurosurgery consult, appreciate assistance -control BP -hold aspirin, warfarin  # Fall - Patient notes a fall of unknown etiology 6 days PTA, after fasting from breakfast until 5pm, which occurred while walking.  Differential includes hypoglycemia (history of DM, on metformin and glimepiride, fasting) vs vasovagal syncope vs orthostatic hypotension vs carotid artery stenosis (history of severe CAD, HTN, HL, DM) vs arrythmia (?history of afib, significant CAD) vs infection (though no fever, symptoms).  Unlikely seizure (no tongue biting, loss of bowel continence, post-ictal confusion) vs stroke (no focal neuro symptoms). -admit to  telemetry -repeat echo (last 2009) -consider carotid artery duplex -monitor CBG's, hold hypoglycemics -check TSH -UA, urine culture -hold xanax  # Nausea/dizziness - symptoms worse since a fall 6 days ago.  Symptoms likely represent SAH (seen on CT) vs vertigo (history  of vertigo, rare intermittent symptoms).  Unlikely increased ICP (not seen on CT).  # Hypertension - history of HTN, inconsistent medication usage, presenting with SBP 170-180's. -continue losartan -hydralazine IV prn -hold lasix and metoprolol for now  # CHF - EF 55-60% on echo 2009 -repeat echo  # DM - A1C = 8.4 in 10/2011 -recheck A1C -hold oral hypoglycemics -SSI  # CAD - s/p CABG 1984, 1992.  Patient and his Cardiologist Patty Sermons) have decided not to perform another CABG. -hold aspirin -BP, DM, HL control as above  # GERD - chronic, stable -continue protonix  # Prophy - SCD's  Signed: Janalyn Noble 04/05/2012, 4:34 PM

## 2012-04-05 NOTE — ED Notes (Signed)
Patient transported to X-ray 

## 2012-04-05 NOTE — ED Notes (Signed)
Pt in from home via [facility] for increasing n/v since last Thursday post fall.  Pt states he is sure he lost consciousness and did not visit hospital post fall.  Pt states he originally didn't have n/v but that he had onset a few hours after the fall.  Pt reports that he originally thought it was a bug since "he had been around someone who was sick."  Pt denies diarrhea; denies vision changes.  Pt has no complaints of pain; resting comfortably in room.

## 2012-04-05 NOTE — Consult Note (Signed)
Reason for Consult: Closed head injury Referring Physician: EDP  Benjamin Noble is an 78 y.o. male.   HPI:  78 year old gentleman who fell last Thursday and struck his head on a door. He thinks he loss consciousness. He has had some headache early in the course with no headache today. He has had some nausea and vomiting and some dizziness. Had some unsteadiness on his feet, no more falls. He is on Coumadin. He came to the emergency department today where head CT showed some subarachnoid hemorrhage and neurosurgical evaluation was requested. There has been no decreasing mentation are poor mental status, and in general he has been eating well and remaining active according to his daughter's report.  Past Medical History  Diagnosis Date  . Ischemic heart disease   . HTN (hypertension)   . Angina pectoris   . Diabetes mellitus   . Hypercholesterolemia   . CHF (congestive heart failure)     hx of  . Arthritis   . Urinary incontinence   . CAD (coronary artery disease)     s/p CABG 1984, 1992. Patient declines any further CABG.    Past Surgical History  Procedure Date  . Cardiac catheterization 03/18/1998  . Coronary artery bypass graft [id number]    first at [facility]  . Circumcision 02/05/08  . Eye surgery     Allergies  Allergen Reactions  . Penicillins Anaphylaxis and Swelling  . Lipitor (Atorvastatin Calcium) Other (See Comments)    Gi issues  . Macrodantin Other (See Comments)    Unknown   . Morphine And Related Other (See Comments)    "doesn't feel right"  . Ranexa Other (See Comments)    constipation  . Statins Other (See Comments)    Leg weakness    History  Substance Use Topics  . Smoking status: Never Smoker   . Smokeless tobacco: Not on file  . Alcohol Use: No    Family History  Problem Relation Age of Onset  . Heart attack Father      Review of Systems  Positive ROS: Otherwise negative  All other systems have been reviewed and were  otherwise negative with the exception of those mentioned in the HPI and as above.  Objective: Vital signs in last 24 hours: Temp:  [98.4 F (36.9 C)] 98.4 F (36.9 C) (04/17 1130) Pulse Rate:  [66-89] 70  (04/17 1445) Resp:  [13-21] 17  (04/17 1330) BP: (143-182)/(67-101) 180/88 mmHg (04/17 1430) SpO2:  [94 %-100 %] 98 % (04/17 1445)  General Appearance: Alert, cooperative, no distress, appears stated age Head: Normocephalic, without obvious abnormality, atraumatic Eyes: PERRL with some irregularity likely from cataract surgery      Throat: benign Neck: Supple Extremities: Extremities normal, atraumatic, no cyanosis or edema Pulses: 2+ and symmetric all extremities Skin: Skin color, texture, turgor normal, no rashes or lesions  NEUROLOGIC:   Mental status: A&O x4, no aphasia, good attention span, Memory and fund of knowledge Motor Exam - grossly normal for age, normal tone and bulk, no drift Sensory Exam - grossly normal Coordination - grossly normal Gait - not tested Balance - not tested Cranial Nerves: I: smell Not tested  II: visual acuity  OS: Okay    OD: Okay   II: visual fields Full to confrontation  II: pupils Equal, round, reactive to light  III,VII: ptosis None  III,IV,VI: extraocular muscles  Full ROM  V: mastication Normal  V: facial light touch sensation  Normal  V,VII: corneal reflex  Present  VII: facial muscle function - upper  Normal  VII: facial muscle function - lower Normal  VIII: hearing Not tested  IX: soft palate elevation  Normal  IX,X: gag reflex Present  XI: trapezius strength  5/5  XI: sternocleidomastoid strength 5/5  XI: neck flexion strength  5/5  XII: tongue strength  Normal    Data Review Lab Results  Component Value Date   WBC 6.2 04/05/2012   HGB 14.1 04/05/2012   HCT 41.1 04/05/2012   MCV 90.7 04/05/2012   PLT 215 04/05/2012   Lab Results  Component Value Date   NA 138 04/05/2012   K 4.4 04/05/2012   CL 101 04/05/2012   CO2 26  04/05/2012   BUN 32* 04/05/2012   CREATININE 1.22 04/05/2012   GLUCOSE 121* 04/05/2012   Lab Results  Component Value Date   INR 1.18 04/05/2012    Radiology: No results found. CT scan: Shows some small hyperdensity in the left frontal region that appears extra-axial and is probably some traumatic subarachnoid blood without mass effect or shift, as a systems are open, ventricles are mildly enlarged  Assessment/Plan: Mild closed head injury with postconcussive syndrome. He has being admitted to the hospitalist service for observation. No acute neurosurgical intervention is needed. The traumatic subarachnoid blood will likely resolve fairly quickly. He can likely safely resume his Coumadin in 1-2 weeks. Repeat his CT scan either tomorrow or in the next few days to look for a solution of the blood.   Benjamin Noble S 04/05/2012 5:05 PM

## 2012-04-05 NOTE — ED Provider Notes (Signed)
History     CSN: [id number]  Arrival date & time 04/05/12  1127   First MD Initiated Contact with Patient 04/05/12 1152      HPI Patient reports a fall 1 week ago. States he was dizzy and may have fallen due to that. Reports he hit the back of his head. States since then has had worsening dizziness and nausea. He describes the nausea as the sensation of his surrounding spinning. Denies HA, numbness, tingling, weakness, CP, SOB, aphasia, ataxia, neck pain, fever, AMS.  Patient is a 78 y.o. male presenting with fall. The history is provided by the patient and a relative.  Fall Incident onset: 7 days ago. The fall occurred while walking. He landed on carpet. There was no blood loss. The point of impact was the head. The pain is at a severity of 0/10. The patient is experiencing no pain. He was ambulatory at the scene. There was no entrapment after the fall. There was no drug use involved in the accident. There was no alcohol use involved in the accident. Associated symptoms include nausea. Pertinent negatives include no visual change, no fever, no numbness, no abdominal pain, no bowel incontinence, no vomiting, no hematuria, no headaches, no hearing loss, no loss of consciousness and no tingling. Associated symptoms comments: Dizziness . The symptoms are aggravated by activity.    Past Medical History  Diagnosis Date  . Ischemic heart disease   . HBP (high blood pressure)   . Angina pectoris   . Diabetes mellitus   . Hypercholesterolemia   . CHF (congestive heart failure)     hx of  . Arthritis     Past Surgical History  Procedure Date  . Cardiac catheterization 03/18/1998  . Coronary artery bypass graft [id number]    first at [facility]  . Circumcision 02/05/08  . Eye surgery     No family history on file.  History  Substance Use Topics  . Smoking status: Never Smoker   . Smokeless tobacco: Not on file  . Alcohol Use: No      Review of Systems  Constitutional: Negative  for fever, chills, diaphoresis and fatigue.  Respiratory: Negative for cough and shortness of breath.   Cardiovascular: Negative for chest pain.  Gastrointestinal: Positive for nausea. Negative for vomiting, abdominal pain and bowel incontinence.  Genitourinary: Negative for dysuria, frequency and hematuria.  Musculoskeletal: Negative for myalgias.  Neurological: Positive for dizziness and light-headedness. Negative for tingling, loss of consciousness, speech difficulty, weakness, numbness and headaches.  All other systems reviewed and are negative.    Allergies  Penicillins; Lipitor; Macrodantin; Morphine and related; Ranexa; and Statins  Home Medications   Current Outpatient Rx  Name Route Sig Dispense Refill  . ALPRAZOLAM 0.25 MG PO TABS Oral Take 0.25 mg by mouth at bedtime as needed. For anxiety    . ASPIRIN 81 MG PO CHEW Oral Chew 81 mg by mouth daily as needed. For angina    . ASPIRIN EC 81 MG PO TBEC Oral Take 81 mg by mouth every morning.    Marland Kitchen DOCUSATE SODIUM 100 MG PO CAPS Oral Take 100 mg by mouth daily.     Marland Kitchen FERROUS SULFATE 325 (65 FE) MG PO TABS Oral Take 325 mg by mouth daily with breakfast.     . FUROSEMIDE 40 MG PO TABS Oral Take 20 mg by mouth daily.    Marland Kitchen GLIMEPIRIDE 4 MG PO TABS Oral Take 4 mg by mouth daily before breakfast.    .  ISOSORBIDE MONONITRATE ER 30 MG PO TB24 Oral Take 30-60 mg by mouth 2 (two) times daily. Take 2 in the morning and 1 in the evening    . LOPERAMIDE HCL 1 MG/5ML PO LIQD Oral Take 3 mg by mouth 4 (four) times daily as needed. For diarrhea    . LOSARTAN POTASSIUM 100 MG PO TABS Oral Take 100 mg by mouth every morning.    Marland Kitchen METFORMIN HCL 500 MG PO TABS Oral Take 500-1,000 mg by mouth 2 (two) times daily. 2 tablets in the morning and 1 tablet in the evening    . METOPROLOL TARTRATE 50 MG PO TABS Oral Take 50-100 mg by mouth 2 (two) times daily. 2 in the morning and 1 at night    . NITROGLYCERIN 0.4 MG SL SUBL Sublingual Place 0.4 mg under the  tongue every 5 (five) minutes as needed. For chest pain.    Marland Kitchen PANTOPRAZOLE SODIUM 40 MG PO TBEC Oral Take 40 mg by mouth daily.      Barron Alvine SODIUM 2 MG PO TABS Oral Take 2 mg by mouth every Tuesday, Thursday, Saturday, and Sunday at 6 PM.     . WARFARIN SODIUM 5 MG PO TABS Oral Take 5 mg by mouth every Monday, Wednesday, and Friday.       BP 180/88  Pulse 70  Temp(Src) 98.4 F (36.9 C) (Oral)  Resp 17  SpO2 98%  Physical Exam  Constitutional: He is oriented to person, place, and time. He appears well-developed and well-nourished.  HENT:  Head: Normocephalic.  Right Ear: External ear normal.  Left Ear: External ear normal.  Nose: Nose normal.  Mouth/Throat: Oropharynx is clear and moist. No oropharyngeal exudate.       Contusion on posterior scalp  Eyes: Conjunctivae and EOM are normal. Pupils are equal, round, and reactive to light. Left eye exhibits no discharge.  Neck: Normal range of motion. Neck supple.  Cardiovascular: Normal rate, regular rhythm and normal heart sounds.  Exam reveals no gallop and no friction rub.   No murmur heard. Pulmonary/Chest: Effort normal and breath sounds normal. He has no wheezes. He has no rales. He exhibits no tenderness.  Abdominal: Soft. Bowel sounds are normal. He exhibits no mass. There is no tenderness. There is no rebound and no guarding.  Neurological: He is alert and oriented to person, place, and time. No cranial nerve deficit. He exhibits normal muscle tone. Coordination normal.  Skin: Skin is warm and dry. No rash noted. No erythema. No pallor.  Psychiatric: He has a normal mood and affect. His behavior is normal.    ED Course  Procedures   Results for orders placed during the hospital encounter of 04/05/12  CBC      Component Value Range   WBC 6.2  4.0 - 10.5 (K/uL)   RBC 4.53  4.22 - 5.81 (MIL/uL)   Hemoglobin 14.1  13.0 - 17.0 (g/dL)   HCT 16.1  09.6 - 04.5 (%)   MCV 90.7  78.0 - 100.0 (fL)   MCH 31.1  26.0 - 34.0 (pg)     MCHC 34.3  30.0 - 36.0 (g/dL)   RDW 40.9  81.1 - 91.4 (%)   Platelets 215  150 - 400 (K/uL)  DIFFERENTIAL      Component Value Range   Neutrophils Relative 63  43 - 77 (%)   Neutro Abs 3.9  1.7 - 7.7 (K/uL)   Lymphocytes Relative 19  12 - 46 (%)  Lymphs Abs 1.2  0.7 - 4.0 (K/uL)   Monocytes Relative 15 (*) 3 - 12 (%)   Monocytes Absolute 0.9  0.1 - 1.0 (K/uL)   Eosinophils Relative 2  0 - 5 (%)   Eosinophils Absolute 0.1  0.0 - 0.7 (K/uL)   Basophils Relative 1  0 - 1 (%)   Basophils Absolute 0.1  0.0 - 0.1 (K/uL)  COMPREHENSIVE METABOLIC PANEL      Component Value Range   Sodium 138  135 - 145 (mEq/L)   Potassium 4.4  3.5 - 5.1 (mEq/L)   Chloride 101  96 - 112 (mEq/L)   CO2 26  19 - 32 (mEq/L)   Glucose, Bld 121 (*) 70 - 99 (mg/dL)   BUN 32 (*) 6 - 23 (mg/dL)   Creatinine, Ser 1.61  0.50 - 1.35 (mg/dL)   Calcium 9.2  8.4 - 09.6 (mg/dL)   Total Protein 7.1  6.0 - 8.3 (g/dL)   Albumin 3.3 (*) 3.5 - 5.2 (g/dL)   AST 15  0 - 37 (U/L)   ALT 7  0 - 53 (U/L)   Alkaline Phosphatase 99  39 - 117 (U/L)   Total Bilirubin 0.5  0.3 - 1.2 (mg/dL)   GFR calc non Af Amer 51 (*) >90 (mL/min)   GFR calc Af Amer 60 (*) >90 (mL/min)  URINALYSIS, ROUTINE W REFLEX MICROSCOPIC      Component Value Range   Color, Urine YELLOW  YELLOW    APPearance CLEAR  CLEAR    Specific Gravity, Urine 1.020  1.005 - 1.030    pH 5.5  5.0 - 8.0    Glucose, UA 250 (*) NEGATIVE (mg/dL)   Hgb urine dipstick TRACE (*) NEGATIVE    Bilirubin Urine NEGATIVE  NEGATIVE    Ketones, ur NEGATIVE  NEGATIVE (mg/dL)   Protein, ur 30 (*) NEGATIVE (mg/dL)   Urobilinogen, UA 1.0  0.0 - 1.0 (mg/dL)   Nitrite NEGATIVE  NEGATIVE    Leukocytes, UA NEGATIVE  NEGATIVE   PROTIME-INR      Component Value Range   Prothrombin Time 15.3 (*) 11.6 - 15.2 (seconds)   INR 1.18  0.00 - 1.49   URINE MICROSCOPIC-ADD ON      Component Value Range   RBC / HPF 3-6  <3 (RBC/hpf)   Urine-Other AMORPHOUS URATES/PHOSPHATES    POCT I-STAT  TROPONIN I      Component Value Range   Troponin i, poc 0.02  0.00 - 0.08 (ng/mL)   Comment 3            Dg Chest 2 View  04/05/2012  *RADIOLOGY REPORT*  Clinical Data: Dizziness, weakness.  CHEST - 2 VIEW  Comparison: None  Findings: Prior median sternotomy.  Heart is borderline in size. Mild peribronchial thickening.  Scarring in the left lung base.  No confluent opacity in the right.  No effusions or acute bony abnormality.  IMPRESSION: Bronchitic changes.  Scarring in the left base.  Borderline heart size.  Original Report Authenticated By: Cyndie Chime, M.D.   Ct Head Wo Contrast  04/05/2012  *RADIOLOGY REPORT*  Clinical Data: Nausea, vomiting, fall.  CT HEAD WITHOUT CONTRAST  Technique:  Contiguous axial images were obtained from the base of the skull through the vertex without contrast.  Comparison: None.  Findings: There is a small amount of blood seen in the left frontal region.  This appears to be within sulci compatible with subarachnoid blood.  There is severe atrophy.  Moderate chronic small vessel disease changes.  Ventriculomegaly noted which I suspect is related to ex vacuo dilatation.  No mass effect or midline shift.  No intraparenchymal hemorrhage.  No acute bony abnormality.  IMPRESSION: Small amount of blood in the left frontal region which appears to be extra-axial, likely subarachnoid.  Critical Value/emergent results were called by telephone at the time of interpretation on 04/05/2012  at 2:10 p.m.  to  Dr. Estell Harpin, who verbally acknowledged these results.  Original Report Authenticated By: Cyndie Chime, M.D.     ED ECG REPORT   Date: 04/05/2012  EKG Time: 4:00 PM  Rate: 73  Rhythm: normal sinus rhythm,  occasional PVC noted, unifocal  Axis: left  Intervals:right bundle branch block and left anterior fascicular block  ST&T Change: nonspecific T wave changes    Narrative Interpretation: No significant changes since 01/31/2008               MDM   Discussed  labs and imaging with patient and family. Will have admitted to gen med with neurosurg consult. Patient is stable, no focal neuro findings. Discussed with Dr. Estell Harpin who agrees with plan. Spoke with Dr. Tonny Branch, [facility]  who will admit the patient .       Thomasene Lot, PA-C 04/05/12 1558  Thomasene Lot, PA-C 04/05/12 [phone]

## 2012-04-06 DIAGNOSIS — I609 Nontraumatic subarachnoid hemorrhage, unspecified: Secondary | ICD-10-CM

## 2012-04-06 LAB — URINE CULTURE: Culture  Setup Time: 201304171330

## 2012-04-06 LAB — BASIC METABOLIC PANEL
BUN: 24 mg/dL — ABNORMAL HIGH (ref 6–23)
CO2: 25 mEq/L (ref 19–32)
Calcium: 8.9 mg/dL (ref 8.4–10.5)
Glucose, Bld: 208 mg/dL — ABNORMAL HIGH (ref 70–99)
Sodium: 135 mEq/L (ref 135–145)

## 2012-04-06 LAB — CARDIAC PANEL(CRET KIN+CKTOT+MB+TROPI)
CK, MB: 1.8 ng/mL (ref 0.3–4.0)
Relative Index: INVALID (ref 0.0–2.5)
Total CK: 25 U/L (ref 7–232)
Total CK: 27 U/L (ref 7–232)
Troponin I: <0.3 ng/mL (ref <0.30)
Troponin I: <0.3 ng/mL (ref <0.30)

## 2012-04-06 LAB — GLUCOSE, CAPILLARY
Glucose-Capillary: 139 mg/dL — ABNORMAL HIGH (ref 70–99)
Glucose-Capillary: 224 mg/dL — ABNORMAL HIGH (ref 70–99)
Glucose-Capillary: 268 mg/dL — ABNORMAL HIGH (ref 70–99)
Glucose-Capillary: 152 mg/dL — ABNORMAL HIGH (ref 70–99)

## 2012-04-06 LAB — T4, FREE: Free T4: 1.13 ng/dL (ref 0.80–1.80)

## 2012-04-06 MED ORDER — NIMODIPINE 30 MG PO CAPS
60.0000 mg | ORAL_CAPSULE | ORAL | Status: DC
  Administered 2012-04-06 – 2012-04-07 (×5): 60 mg via ORAL
  Filled 2012-04-06 (×13): qty 2

## 2012-04-06 MED ORDER — HYDRALAZINE HCL 20 MG/ML IJ SOLN
5.0000 mg | INTRAMUSCULAR | Status: DC | PRN
  Administered 2012-04-07: 5 mg via INTRAVENOUS
  Filled 2012-04-06: qty 0.25

## 2012-04-06 NOTE — Progress Notes (Signed)
At times patient heart rate drops into low 40's asymtomatic. Continue to moniotr

## 2012-04-06 NOTE — Progress Notes (Signed)
Stopped by for social call. Patient appears to be improving. Thanks for your good care.

## 2012-04-06 NOTE — H&P (Signed)
Internal Medicine Teaching Service Attending Note Date: 04/06/2012  Patient name: Benjamin Noble  Medical record number: [id number]  Date of birth: Apr 11, 1922   I have seen and evaluated Benjamin Noble and discussed their care with the Residency Team. Please see Benjamin Noble H&P for full details. Briefly, Benjamin Noble is an 78 yo male who sees Benjamin Noble for both PCP and card care. He had an unwitnessed fall about 7 days ago that was not precede by any sxs and resulted in brief LOC. He was able to stand up independently and did not seek medical care. Since then, he has had dizziness and mild nausea without vomiting or diarrhea. He came to the ER yesterday for these sxs and his CT showed a small subarachnoid left frontal bleed. He is on ASA and warfarin (INR was 1.18) which have been D/C'd. Neurosurgery eval pt and rec only a F/U CT scan in a couple of days to ensure resolution of the blood.  His BP has trended up over night on Losartan alone. His lasix and BB were held. We will start nimodipine as it has been shown to improve neurologic outcomes in [facility]. It is a CCB and therefore will have BP lowering qualities. We will need to follow BP's closely to avoid hypotension.  His wife has Alzheimer's Dementia. He and his wife live in their home with 24 hour aide assistance. He has a walker that he uses occasionally. He has a cane which he has never taken to. He has a scooter that he hasn't started using but the [place] did build a ramp to their house. He normally uses the furniture to hold onto as he walks in the house.   PE : very pleasant, talkative man in NAD HRRR no MRG, split S2 No bruits in carotids Neuro no focal  Assessment and Plan: I agree with the formulated Assessment and Plan with the following changes:  1. SAH - hold ASA, warfarin. Repeat CT in couple of days. Start nimodipine. Follow BP's closely. PT / OT. Restart warfarin / ASA in 1-2 weeks per neurosurg recs.  2. HTN - cont  ARB. Add CCB. Follow closely. If remains elevated, can restart BB if HR allows.  3. Chronic CHF - repeat ECHO. He had a fall and dizziness since then all likely related to the [facility]. But reassessing the EF would be prudent.  4. DM - Hold orals and follow CBG's.  5. CAD - need to hold ASA and warfarin 2/2 SAH. Hold BB as we need to start CCB.  6. Home assistance - family has provided for 24 hour aide at home .Social work can make certain all home needs are meet. [facility] might be an option. [facility] might be an option but I doubt pt would want to transfer care from Benjamin Noble.

## 2012-04-06 NOTE — Progress Notes (Signed)
Occupational Therapy Evaluation Patient Details Name: Benjamin Noble MRN: [id number] DOB: 11/18/1922 Today's Date: 04/06/2012  Problem List:  Patient Active Problem List  Diagnoses  . Hypertension  . S/P CABG (coronary artery bypass graft)  . Diabetes mellitus type II  . Hypercholesterolemia  . Anxiety associated with depression  . Angina pectoris  . Syncope  . Subarachnoid hemorrhage  . CKD (chronic kidney disease), stage II  . CAD (coronary artery disease)  . Bundle branch block    Past Medical History:  Past Medical History  Diagnosis Date  . Ischemic heart disease   . HTN (hypertension)   . Angina pectoris   . Hypercholesterolemia   . CHF (congestive heart failure)     hx of  . Arthritis   . Urinary incontinence   . CAD (coronary artery disease)     s/p CABG 1984, 1992. Patient declines any further CABG.  . Myocardial infarction   . Dysrhythmia     irregular  heart beats per patient  . Asthma   . Shortness of breath   . Diabetes mellitus     type 2  . GERD (gastroesophageal reflux disease)   . Neuromuscular disorder     periferal neruropathy  . Depression    Past Surgical History:  Past Surgical History  Procedure Date  . Cardiac catheterization 03/18/1998  . Coronary artery bypass graft 4098,1191    first at [facility]  . Circumcision 02/05/08  . Eye surgery   . Cardiac stents     OT Assessment/Plan/Recommendation OT Assessment Clinical Impression Statement: pt presents with recent fall causing SAH.  Will benefit from acute OT to address below problem list in prep for d/c home with family and 24/7 assist.  Would benefit from [facility] for home safety and HEP. OT Recommendation/Assessment: Patient will need skilled OT in the acute care venue OT Problem List: Decreased strength;Decreased activity tolerance;Impaired balance (sitting and/or standing);Decreased safety awareness;Decreased cognition;Decreased knowledge of use of DME or AE OT Therapy  Diagnosis : Generalized weakness OT Plan OT Frequency: Min 2X/week OT Treatment/Interventions: Self-care/ADL training;Therapeutic exercise;DME and/or AE instruction;Therapeutic activities;Patient/family education OT Recommendation Follow Up Recommendations: Home health OT;Supervision/Assistance - 24 hour Equipment Recommended: None recommended by OT Individuals Consulted Consulted and Agree with Results and Recommendations: Patient OT Goals Acute Rehab OT Goals OT Goal Formulation: With patient Time For Goal Achievement: 2 weeks ADL Goals Pt Will Perform Lower Body Bathing: with set-up;Sit to stand from chair;Sit to stand from bed ADL Goal: Lower Body Bathing - Progress: Goal set today Pt Will Perform Lower Body Dressing: with set-up;Sit to stand from chair;Sit to stand from bed ADL Goal: Lower Body Dressing - Progress: Goal set today Pt Will Transfer to Toilet: with supervision;with DME;Ambulation;3-in-1 ADL Goal: Toilet Transfer - Progress: Goal set today Pt Will Perform Tub/Shower Transfer: Tub transfer;with supervision;Ambulation;with DME;Shower seat with back;Grab bars ADL Goal: Tub/Shower Transfer - Progress: Goal set today Arm Goals Additional Arm Goal #1: Pt will independently perform HEP in order to increase bil. UE strength and activity tolerance. Arm Goal: Additional Goal #1 - Progress: Goal set today Miscellaneous OT Goals Miscellaneous OT Goal #1: Pt will consistently demonstrate safe use of RW during all functional mobility. OT Goal: Miscellaneous Goal #1 - Progress: Goal set today  OT Evaluation Precautions/Restrictions  Precautions Precautions: Fall Restrictions Weight Bearing Restrictions: No Prior Functioning Home Living Lives With: Spouse Available Help at Discharge: Family;Personal care attendant;Available 24 hours/day (Between family and paid help.  ) Type of Home: House  Home Access: Ramped entrance Home Layout: One level Bathroom Shower/Tub: Teacher, music: Standard Bathroom Accessibility: Yes How Accessible: Accessible via walker Home Adaptive Equipment: Shower chair with back;Walker - rolling;Walker - four wheeled;Grab bars in shower;Electric Scooter;Hand-held shower hose;Other (comment) (tub rail/bar) Prior Function Level of Independence: Needs assistance;Independent with assistive device(s) Needs Assistance: Meal Prep;Light Housekeeping Able to Take Stairs?:  (With A) Driving: No Vocation: Retired Comments: Pt states that he often uses furniture at home to hold onto rather than his RW. Performs tub transfers only when son is home. Pt's daughter states that pt spends much of day in chair watching television and has decreased activity tolerance.  ADL ADL Grooming: Simulated;Set up Grooming Details (indicate cue type and reason): Setup to gather items Where Assessed - Grooming: Sitting, bed Upper Body Bathing: Simulated;Set up Upper Body Bathing Details (indicate cue type and reason): setup assist to retrieve bathing items Where Assessed - Upper Body Bathing: Sitting, bed Lower Body Bathing: Simulated;Other (comment) (min guard) Lower Body Bathing Details (indicate cue type and reason): min guard for safety during standing Where Assessed - Lower Body Bathing: Sit to stand from bed Upper Body Dressing: Simulated;Set up Upper Body Dressing Details (indicate cue type and reason): setup to retrieve dressing items Where Assessed - Upper Body Dressing: Sit to stand from bed Lower Body Dressing: Simulated;Other (comment) (min guard assist) Lower Body Dressing Details (indicate cue type and reason): min guard for safety during standing Where Assessed - Lower Body Dressing: Sit to stand from bed Toilet Transfer: Simulated;Minimal assistance Toilet Transfer Details (indicate cue type and reason): hand held assist for steadying and balance. required increased time. Toilet Transfer Method: Retail banker: Other (comment) (bed) Equipment Used: Other (comment);Gait belt (hand held assist) Vision/Perception  Vision - History Baseline Vision: Bifocals Vision - Assessment Eye Alignment: Within Functional Limits Additional Comments: Vision appears [facility]. Cognition Cognition Overall Cognitive Status: Appears within functional limits for tasks assessed/performed Arousal/Alertness: Awake/alert Orientation Level: Appears intact for tasks assessed Behavior During Session: [facility] for tasks performed Cognition - Other Comments: Pt able to correctly answer basic cognitive questions.  Patient oriented x4 but unable to recall details of fall or why it happened.  Pt also states that he is unable to recall his home phone number. Sensation/Coordination Sensation Light Touch: Appears Intact Stereognosis: Appears Intact Coordination Gross Motor Movements are Fluid and Coordinated: Yes Fine Motor Movements are Fluid and Coordinated: Yes Extremity Assessment RUE Assessment RUE Assessment: Within Functional Limits LUE Assessment LUE Assessment: Within Functional Limits Mobility  Bed Mobility Bed Mobility: Yes Supine to Sit: 6: Modified independent (Device/Increase time);With rails (Needs increased time) Sitting - Scoot to Edge of Bed: 6: Modified independent (Device/Increase time) Transfers Sit to Stand: 5: Supervision;With upper extremity assist;From bed (Close Supervision) Sit to Stand Details (indicate cue type and reason): Mildly unsteady Stand to Sit: 4: Min assist;With upper extremity assist;With armrests;To chair/3-in-1 Stand to Sit Details: cues to get closer to chair prior to sitting, control descent Exercises   End of Session OT - End of Session Equipment Utilized During Treatment: Gait belt Activity Tolerance: Patient tolerated treatment well Patient left: in bed;with call bell in reach;with family/visitor present Nurse Communication: Mobility status for transfers;Mobility status  for ambulation General Behavior During Session: [facility] for tasks performed Cognition: [facility] for tasks performed  4:58 PM  04/06/2012 Cipriano Mile OTR/L Pager [phone] Office [phone]

## 2012-04-06 NOTE — Progress Notes (Signed)
Subjective: The patient notes not sleeping well overnight, stating he is not used to sleeping without his wife.  He continues to note some dizziness when sitting up in bed or transferring to urinal, but not when lying or sitting still.  He continues to note no headache.  Objective: Vital signs in last 24 hours: Filed Vitals:   04/05/12 2050 04/05/12 2053 04/06/12 0300 04/06/12 0546  BP: 189/105 200/119 163/97 203/114  Pulse:   75 71  Temp:   98.8 F (37.1 C) 98.6 F (37 C)  TempSrc:   Oral Oral  Resp:   18 18  SpO2:    99%   Weight change:   Intake/Output Summary (Last 24 hours) at 04/06/12 0854 Last data filed at 04/06/12 0321  Gross per 24 hour  Intake      0 ml  Output    300 ml  Net   -300 ml   Physical Exam: General: alert, cooperative, and in no apparent distress HEENT: pupils equal round and reactive to light, EOMI, vision grossly intact, oropharynx clear and non-erythematous  Neck: supple, no lymphadenopathy, no JVD Lungs: clear to ascultation bilaterally, normal work of respiration, no wheezes, rales, ronchi Heart: regular rate and rhythm, no murmurs, gallops, or rubs Abdomen: soft, non-tender, non-distended, normal bowel sounds  Extremities: no cyanosis, clubbing, or edema Neurologic: alert & oriented X3, cranial nerves II-XII intact, strength 5/5 throughout, sensation intact to light touch throughout, finger-to-nose test normal  Lab Results: Basic Metabolic Panel:  Lab 04/06/12 1610 04/05/12 1300  NA 135 138  K 3.9 4.4  CL 102 101  CO2 25 26  GLUCOSE 208* 121*  BUN 24* 32*  CREATININE 1.14 1.22  CALCIUM 8.9 9.2  MG -- --  PHOS -- --   Liver Function Tests:  Lab 04/05/12 1300  AST 15  ALT 7  ALKPHOS 99  BILITOT 0.5  PROT 7.1  ALBUMIN 3.3*   CBC:  Lab 04/05/12 1300  WBC 6.2  NEUTROABS 3.9  HGB 14.1  HCT 41.1  MCV 90.7  PLT 215   Cardiac Enzymes:  Lab 04/06/12 0249 04/05/12 2052  CKTOTAL 25 22  CKMB 1.8 1.7  CKMBINDEX -- --    TROPONINI <0.30 <0.30   CBG:  Lab 04/06/12 0837 04/05/12 2216  GLUCAP 139* 226*   Hemoglobin A1C:  Lab 04/05/12 2052  HGBA1C 7.8*   Thyroid Function Tests:  Lab 04/05/12 2052  TSH 5.783*  T4TOTAL --  FREET4 --  T3FREE --  THYROIDAB --   Coagulation:  Lab 04/05/12 1300  LABPROT 15.3*  INR 1.18   Urinalysis:  Lab 04/05/12 1259  COLORURINE YELLOW  LABSPEC 1.020  PHURINE 5.5  GLUCOSEU 250*  HGBUR TRACE*  BILIRUBINUR NEGATIVE  KETONESUR NEGATIVE  PROTEINUR 30*  UROBILINOGEN 1.0  NITRITE NEGATIVE  LEUKOCYTESUR NEGATIVE    Micro Results: No results found for this or any previous visit (from the past 240 hour(s)). Studies/Results: Dg Chest 2 View  04/05/2012  *RADIOLOGY REPORT*  Clinical Data: Dizziness, weakness.  CHEST - 2 VIEW  Comparison: None  Findings: Prior median sternotomy.  Heart is borderline in size. Mild peribronchial thickening.  Scarring in the left lung base.  No confluent opacity in the right.  No effusions or acute bony abnormality.  IMPRESSION: Bronchitic changes.  Scarring in the left base.  Borderline heart size.  Original Report Authenticated By: Cyndie Chime, M.D.   Ct Head Wo Contrast  04/05/2012  *RADIOLOGY REPORT*  Clinical Data: Nausea, vomiting, fall.  CT HEAD WITHOUT CONTRAST  Technique:  Contiguous axial images were obtained from the base of the skull through the vertex without contrast.  Comparison: None.  Findings: There is a small amount of blood seen in the left frontal region.  This appears to be within sulci compatible with subarachnoid blood.  There is severe atrophy.  Moderate chronic small vessel disease changes.  Ventriculomegaly noted which I suspect is related to ex vacuo dilatation.  No mass effect or midline shift.  No intraparenchymal hemorrhage.  No acute bony abnormality.  IMPRESSION: Small amount of blood in the left frontal region which appears to be extra-axial, likely subarachnoid.  Critical Value/emergent results were  called by telephone at the time of interpretation on 04/05/2012  at 2:10 p.m.  to  Dr. Estell Harpin, who verbally acknowledged these results.  Original Report Authenticated By: Cyndie Chime, M.D.   Medications: I have reviewed the patient's current medications. Scheduled Meds:   . docusate sodium  100 mg Oral Daily  . insulin aspart  0-9 Units Subcutaneous TID WC  . losartan  50 mg Oral Daily  . niMODipine  60 mg Oral Q4H  . pantoprazole  40 mg Oral Daily  . sodium chloride  500 mL Intravenous Once  . sodium chloride  3 mL Intravenous Q12H  . sodium chloride  3 mL Intravenous Q12H   Continuous Infusions:   . DISCONTD: sodium chloride 500 mL (04/05/12 1332)   PRN Meds:.acetaminophen, acetaminophen, ALPRAZolam, hydrALAZINE, ondansetron (ZOFRAN) IV, ondansetron, oxyCODONE, sodium chloride  Assessment/Plan: The patient is an 78 yo man, history of DM, CAD,?afib, coumadin use (inconsistent), presenting 6 days s/p fall, with small SAH.   # Subarachnoid Hemorrhage - CT head shows small SAH in left frontal lobe, likely secondary to a fall 6 days earlier, with aspirin and coumadin usage. -neurosurgery consult, appreciate assistance  -control BP  -hold aspirin, warfarin  -adding nimodipine to decrease risk of intracranial vasospasm  # Fall - Patient notes a fall of unknown etiology 6 days PTA, after fasting from breakfast until 5pm, which occurred while walking. Differential includes hypoglycemia  vs vasovagal syncope vs orthostatic hypotension vs vertigo vs arrythmia. -continue telemetry, no events overnight. -repeat echo -monitor CBG's, hold hypoglycemics  -PT/OT consults to assess mobility -TSH elevated, checking free T4 for possible hypothyroidism  # Nausea/dizziness - symptoms worse since a fall 6 days PTA. Symptoms likely represent SAH vs vertigo.  Will avoid checking dix-halpike in the setting of acute SAH. -monitor symptoms with PT/OT   # Hypertension - history of HTN, inconsistent  medication usage, presenting with SBP 170-180's.  Overnight, BP's have increased into the SBP 200's.  Goal SBP 160. -continue losartan  -add nimodipine -hydralazine IV prn for SBP > 180 -hold lasix and metoprolol for now , may restart metoprolol if BP's still elevated after nimodipine  # CHF - EF 55-60% on echo 2009  -repeat echo   # DM - A1C = 8.4 in 10/2011, now found to be 7.8 -hold oral hypoglycemics  -SSI   # CAD - s/p CABG 1984, 1992. Patient and his Cardiologist Patty Sermons) have decided not to perform another CABG.  -hold aspirin   # GERD - chronic, stable  -continue protonix   # Prophy - SCD's, avoid lovenox/heparin given acute bleed   LOS: 1 day   [name] 04/06/2012, 8:54 AM

## 2012-04-06 NOTE — Evaluation (Signed)
Physical Therapy Evaluation Patient Details Name: Benjamin Noble MRN: [id number] DOB: [birth year] Today's Date: 04/06/2012  Problem List:  Patient Active Problem List  Diagnoses  . Hypertension  . S/P CABG (coronary artery bypass graft)  . Diabetes mellitus type II  . Hypercholesterolemia  . Anxiety associated with depression  . Angina pectoris  . Syncope  . Subarachnoid hemorrhage  . CKD (chronic kidney disease), stage II  . CAD (coronary artery disease)  . Bundle branch block    Past Medical History:  Past Medical History  Diagnosis Date  . Ischemic heart disease   . HTN (hypertension)   . Angina pectoris   . Hypercholesterolemia   . CHF (congestive heart failure)     hx of  . Arthritis   . Urinary incontinence   . CAD (coronary artery disease)     s/p CABG 1984, 1992. Patient declines any further CABG.  . Myocardial infarction   . Dysrhythmia     irregular  heart beats per patient  . Asthma   . Shortness of breath   . Diabetes mellitus     type 2  . GERD (gastroesophageal reflux disease)   . Neuromuscular disorder     periferal neruropathy  . Depression    Past Surgical History:  Past Surgical History  Procedure Date  . Cardiac catheterization 03/18/1998  . Coronary artery bypass graft 2440,1027    first at [facility]  . Circumcision 02/05/08  . Eye surgery   . Cardiac stents     PT Assessment/Plan/Recommendation PT Assessment Clinical Impression Statement: pt presents with recent fall causing SAH.  pt admits to this PT that he has been told to use a RW, and wasn't using one at the time of his fall.  pt mentions good family and hired CG support 24/7 for his wife and him.  Feel pt would benefit from HHPT to improve balance and safety.   PT Recommendation/Assessment: Patient will need skilled PT in the acute care venue PT Problem List: Decreased strength;Decreased activity tolerance;Decreased balance;Decreased mobility;Decreased knowledge of use  of DME Barriers to Discharge: None PT Therapy Diagnosis : Difficulty walking PT Plan PT Frequency: Min 3X/week PT Recommendation Follow Up Recommendations: Home health PT;Supervision/Assistance - 24 hour Equipment Recommended: None recommended by PT PT Goals  Acute Rehab PT Goals PT Goal Formulation: With patient Time For Goal Achievement: 2 weeks Pt will go Supine/Side to Sit: Independently PT Goal: Supine/Side to Sit - Progress: Goal set today Pt will go Sit to Supine/Side: Independently PT Goal: Sit to Supine/Side - Progress: Goal set today Pt will go Sit to Stand: with modified independence;with upper extremity assist PT Goal: Sit to Stand - Progress: Goal set today Pt will go Stand to Sit: with modified independence;with upper extremity assist PT Goal: Stand to Sit - Progress: Goal set today Pt will Ambulate: >150 feet;with modified independence;with rolling walker PT Goal: Ambulate - Progress: Goal set today  PT Evaluation Precautions/Restrictions  Precautions Precautions: Fall Restrictions Weight Bearing Restrictions: No Prior Functioning  Home Living Lives With: Spouse Available Help at Discharge: Family;Personal care attendant;Available 24 hours/day (Between family and paid help.  ) Type of Home: House Home Access: Ramped entrance Home Layout: One level Bathroom Shower/Tub: Engineer, manufacturing systems: Standard Bathroom Accessibility: Yes How Accessible: Accessible via walker Home Adaptive Equipment: Shower chair with back;Walker - rolling;Walker - four wheeled;Grab bars in shower;Art gallery manager Prior Function Level of Independence: Needs assistance;Independent with assistive device(s) Needs Assistance: Meal Prep;Light Housekeeping Able to  Take Stairs?:  (With A) Driving: No Vocation: Retired Chief Strategy Officer RLE Assessment RLE Assessment: Within Functional Limits LLE Assessment LLE Assessment: Within  Functional Limits Mobility (including Balance) Bed Mobility Bed Mobility: Yes Supine to Sit: 6: Modified independent (Device/Increase time);With rails (Needs increased time) Sitting - Scoot to Edge of Bed: 6: Modified independent (Device/Increase time) Transfers Transfers: Yes Sit to Stand: 5: Supervision;With upper extremity assist;From bed (Close Supervision) Sit to Stand Details (indicate cue type and reason): Mildly unsteady Stand to Sit: 4: Min assist;With upper extremity assist;With armrests;To chair/3-in-1 Stand to Sit Details: cues to get closer to chair prior to sitting, control descent Ambulation/Gait Ambulation/Gait: Yes Ambulation/Gait Assistance: 4: Min assist Ambulation/Gait Assistance Details (indicate cue type and reason): pt notes feeling a little unsteady and notes mild dizziness, but states it's not bad enough not to walk.  During ambulating pt notes that he was told he was supposed to use a RW, but admits he doesn't always.   Ambulation Distance (Feet): 15 Feet Assistive device: 1 person hand held assist Gait Pattern: Step-through pattern;Decreased stride length (Very slow cautious steps) Stairs: No Wheelchair Mobility Wheelchair Mobility: No  Posture/Postural Control Posture/Postural Control: No significant limitations Balance Balance Assessed: Yes Static Standing Balance Static Standing - Balance Support: No upper extremity supported Static Standing - Level of Assistance: 4: Min assist Static Standing - Comment/# of Minutes: pt mildly unsteady and has noticable sway in static standing with guarded posture with UEs.   Exercise    End of Session PT - End of Session Equipment Utilized During Treatment: Gait belt Activity Tolerance: Patient tolerated treatment well Patient left: in chair;with call bell in reach Nurse Communication: Mobility status for transfers;Mobility status for ambulation General Cognition: WFL for tasks performed  Sunny Schlein,  [place] [phone] 04/06/2012, 10:21 AM

## 2012-04-07 ENCOUNTER — Inpatient Hospital Stay (HOSPITAL_COMMUNITY): Payer: Medicare Other

## 2012-04-07 DIAGNOSIS — I059 Rheumatic mitral valve disease, unspecified: Secondary | ICD-10-CM

## 2012-04-07 LAB — GLUCOSE, CAPILLARY
Glucose-Capillary: 148 mg/dL — ABNORMAL HIGH (ref 70–99)
Glucose-Capillary: 136 mg/dL — ABNORMAL HIGH (ref 70–99)

## 2012-04-07 LAB — BASIC METABOLIC PANEL
BUN: 20 mg/dL (ref 6–23)
Calcium: 9 mg/dL (ref 8.4–10.5)
Chloride: 100 mEq/L (ref 96–112)
GFR calc Af Amer: 68 mL/min — ABNORMAL LOW (ref >90)
GFR calc non Af Amer: 59 mL/min — ABNORMAL LOW (ref >90)
Glucose, Bld: 155 mg/dL — ABNORMAL HIGH (ref 70–99)
Potassium: 4 mEq/L (ref 3.5–5.1)
Sodium: 135 mEq/L (ref 135–145)

## 2012-04-07 LAB — CBC
HCT: 41.8 % (ref 39.0–52.0)
Hemoglobin: 14.6 g/dL (ref 13.0–17.0)
MCH: 31.3 pg (ref 26.0–34.0)
MCHC: 34.9 g/dL (ref 30.0–36.0)
Platelets: 213 10*3/uL (ref 150–400)
RBC: 4.66 MIL/uL (ref 4.22–5.81)

## 2012-04-07 MED ORDER — SODIUM CHLORIDE 0.9 % IV SOLN
INTRAVENOUS | Status: DC
  Administered 2012-04-07 – 2012-04-08 (×3): via INTRAVENOUS

## 2012-04-07 MED ORDER — METOPROLOL TARTRATE 50 MG PO TABS
50.0000 mg | ORAL_TABLET | Freq: Two times a day (BID) | ORAL | Status: DC
  Administered 2012-04-07 – 2012-04-08 (×2): 50 mg via ORAL
  Filled 2012-04-07 (×3): qty 1

## 2012-04-07 NOTE — Progress Notes (Signed)
Patient ID: Benjamin Noble, male   DOB: [birth year], 78 y.o.   MRN: [id number] Subjective: Patient reports no headache, no N/V. Still occ dizziness.  Objective: Vital signs in last 24 hours: Temp:  [97.1 F (36.2 C)-98.2 F (36.8 C)] 97.6 F (36.4 C) (04/19 0524) Pulse Rate:  [76-91] 83  (04/19 0524) Resp:  [20] 20  (04/19 0524) BP: (145-184)/(72-109) 149/80 mmHg (04/19 0524) SpO2:  [95 %-96 %] 95 % (04/19 0145) Weight:  [77.4 kg (170 lb 10.2 oz)] 77.4 kg (170 lb 10.2 oz) (04/18 2300)  Intake/Output from previous day: 04/18 0701 - 04/19 0700 In: 240 [P.O.:240] Out: 325 [Urine:325] Intake/Output this shift:    Neurologic: Grossly normal  Lab Results: Lab Results  Component Value Date   WBC 6.6 04/07/2012   HGB 14.6 04/07/2012   HCT 41.8 04/07/2012   MCV 89.7 04/07/2012   PLT 213 04/07/2012   Lab Results  Component Value Date   INR 1.18 04/05/2012   BMET Lab Results  Component Value Date   NA 135 04/06/2012   K 3.9 04/06/2012   CL 102 04/06/2012   CO2 25 04/06/2012   GLUCOSE 208* 04/06/2012   BUN 24* 04/06/2012   CREATININE 1.14 04/06/2012   CALCIUM 8.9 04/06/2012    Studies/Results: Dg Chest 2 View  04/05/2012  *RADIOLOGY REPORT*  Clinical Data: Dizziness, weakness.  CHEST - 2 VIEW  Comparison: None  Findings: Prior median sternotomy.  Heart is borderline in size. Mild peribronchial thickening.  Scarring in the left lung base.  No confluent opacity in the right.  No effusions or acute bony abnormality.  IMPRESSION: Bronchitic changes.  Scarring in the left base.  Borderline heart size.  Original Report Authenticated By: Cyndie Chime, M.D.   Ct Head Wo Contrast  04/05/2012  *RADIOLOGY REPORT*  Clinical Data: Nausea, vomiting, fall.  CT HEAD WITHOUT CONTRAST  Technique:  Contiguous axial images were obtained from the base of the skull through the vertex without contrast.  Comparison: None.  Findings: There is a small amount of blood seen in the left frontal region.  This  appears to be within sulci compatible with subarachnoid blood.  There is severe atrophy.  Moderate chronic small vessel disease changes.  Ventriculomegaly noted which I suspect is related to ex vacuo dilatation.  No mass effect or midline shift.  No intraparenchymal hemorrhage.  No acute bony abnormality.  IMPRESSION: Small amount of blood in the left frontal region which appears to be extra-axial, likely subarachnoid.  Critical Value/emergent results were called by telephone at the time of interpretation on 04/05/2012  at 2:10 p.m.  to  Dr. Estell Harpin, who verbally acknowledged these results.  Original Report Authenticated By: Cyndie Chime, M.D.    Assessment/Plan: Doing well. No need for nimotop for non-aneurysmal SAH, so I D/Ced it. Will sign off. Please call for any questions or concerns.   LOS: 2 days    Brystol Wasilewski S 04/07/2012, 7:48 AM

## 2012-04-07 NOTE — Progress Notes (Signed)
   CARE MANAGEMENT NOTE 04/07/2012  Patient:  Benjamin Noble, Benjamin Noble   Account Number:  [id number]  Date Initiated:  04/07/2012  Documentation initiated by:  Johny Shock  Subjective/Objective Assessment:   Referral for HHPT/OT     Action/Plan:   Met with pt and daughter, gave list of [facility] providers. Pt has DME, family suppot and 24hr in home assistance.   Anticipated DC Date:  04/09/2012   Anticipated DC Plan:  HOME W HOME HEALTH SERVICES         [facility] Choice  HOME HEALTH   Choice offered to / List presented to:  C-4 Adult Children        HH arranged  HH-2 PT  HH-3 OT      Status of service:  In process, will continue to follow Medicare Important Message given?   (If response is "NO", the following Medicare IM given date fields will be blank) Date Medicare IM given:   Date Additional Medicare IM given:    Discharge Disposition:  HOME W HOME HEALTH SERVICES  Per UR Regulation:    If discussed at Long Length of Stay Meetings, dates discussed:    Comments:

## 2012-04-07 NOTE — Progress Notes (Signed)
Medical Student Daily Progress Note  Subjective: Patient reports feeling well and has continued improvement. He does note an episode of indigestion and gas overnight that he called CP, however subsequent EKG was negative and he reports the episode resolved quickly after using the restroom. He is still reporting some lightheaded/dizziness when he sits upright or stands. He denies any N/V, headache, diarrhea, or change in stools. Objective: Vital signs in last 24 hours: Filed Vitals:   04/06/12 2007 04/06/12 2300 04/07/12 0145 04/07/12 0524  BP: 155/88  147/79 149/80  Pulse: 82  83 83  Temp: 97.1 F (36.2 C)  97.8 F (36.6 C) 97.6 F (36.4 C)  TempSrc: Oral  Oral Oral  Resp: 20  20 20   Height:  5\' 9"  (1.753 m)    Weight:  77.4 kg (170 lb 10.2 oz)    SpO2: 96%  95%    Weight change:   Intake/Output Summary (Last 24 hours) at 04/07/12 1214 Last data filed at 04/07/12 0700  Gross per 24 hour  Intake    480 ml  Output    125 ml  Net    355 ml   Physical Exam: BP 149/80  Pulse 83  Temp(Src) 97.6 F (36.4 C) (Oral)  Resp 20  Ht 5\' 9"  (1.753 m)  Wt 77.4 kg (170 lb 10.2 oz)  BMI 25.20 kg/m2  SpO2 95% General appearance: alert, cooperative and no distress Head: Normocephalic, without obvious abnormality, atraumatic Eyes: conjunctivae/corneas clear. PERRL, EOM's intact. Fundi benign. Neck: no adenopathy, no carotid bruit, no JVD, supple, symmetrical, trachea midline and thyroid not enlarged, symmetric, no tenderness/mass/nodules Back: symmetric, no curvature. ROM normal. No CVA tenderness. Lungs: clear to auscultation bilaterally Chest wall: no tenderness Heart: regular rate and rhythm Extremities: extremities normal, atraumatic, no cyanosis or edema Skin: Skin color, texture, turgor normal. No rashes or lesions Lab Results: Basic Metabolic Panel:  Lab 04/07/12 0102 04/06/12 0249  NA 135 135  K 4.0 3.9  CL 100 102  CO2 25 25  GLUCOSE 155* 208*  BUN 20 24*  CREATININE  1.09 1.14  CALCIUM 9.0 8.9  MG -- --  PHOS -- --   Liver Function Tests:  Lab 04/05/12 1300  AST 15  ALT 7  ALKPHOS 99  BILITOT 0.5  PROT 7.1  ALBUMIN 3.3*   No results found for this basename: LIPASE:2,AMYLASE:2 in the last 168 hours No results found for this basename: AMMONIA:2 in the last 168 hours CBC:  Lab 04/07/12 0635 04/05/12 1300  WBC 6.6 6.2  NEUTROABS -- 3.9  HGB 14.6 14.1  HCT 41.8 41.1  MCV 89.7 90.7  PLT 213 215   Cardiac Enzymes:  Lab 04/06/12 0902 04/06/12 0249 04/05/12 2052  CKTOTAL 27 25 22   CKMB 2.1 1.8 1.7  CKMBINDEX -- -- --  TROPONINI <0.30 <0.30 <0.30   BNP: No results found for this basename: PROBNP:3 in the last 168 hours D-Dimer: No results found for this basename: DDIMER:2 in the last 168 hours CBG:  Lab 04/07/12 1129 04/07/12 0626 04/06/12 2148 04/06/12 1629 04/06/12 1114 04/06/12 0837  GLUCAP 201* 148* 152* 268* 224* 139*   Hemoglobin A1C:  Lab 04/05/12 2052  HGBA1C 7.8*   Fasting Lipid Panel: No results found for this basename: CHOL,HDL,LDLCALC,TRIG,CHOLHDL,LDLDIRECT in the last 725 hours Thyroid Function Tests:  Lab 04/06/12 0902 04/05/12 2052  TSH -- 5.783*  T4TOTAL -- --  FREET4 1.13 --  T3FREE -- --  Marko Plume -- --   Coagulation:  Lab 04/05/12  1300  LABPROT 15.3*  INR 1.18   Anemia Panel: No results found for this basename: VITAMINB12,FOLATE,FERRITIN,TIBC,IRON,RETICCTPCT in the last 168 hours Urine Drug Screen: Drugs of Abuse  No results found for this basename: labopia, cocainscrnur, labbenz, amphetmu, thcu, labbarb    Alcohol Level: No results found for this basename: ETH:2 in the last 168 hours Urinalysis:  Lab 04/05/12 1259  COLORURINE YELLOW  LABSPEC 1.020  PHURINE 5.5  GLUCOSEU 250*  HGBUR TRACE*  BILIRUBINUR NEGATIVE  KETONESUR NEGATIVE  PROTEINUR 30*  UROBILINOGEN 1.0  NITRITE NEGATIVE  LEUKOCYTESUR NEGATIVE    Micro Results: Recent Results (from the past 240 hour(s))  URINE CULTURE      Status: Normal   Collection Time   04/05/12 12:59 PM      Component Value Range Status Comment   Specimen Description URINE, CATHETERIZED   Final    Special Requests NONE   Final    Culture  Setup Time [id number]   Final    Colony Count NO GROWTH   Final    Culture NO GROWTH   Final    Report Status 04/06/2012 FINAL   Final    Studies/Results: Dg Chest 2 View  04/05/2012  *RADIOLOGY REPORT*  Clinical Data: Dizziness, weakness.  CHEST - 2 VIEW  Comparison: None  Findings: Prior median sternotomy.  Heart is borderline in size. Mild peribronchial thickening.  Scarring in the left lung base.  No confluent opacity in the right.  No effusions or acute bony abnormality.  IMPRESSION: Bronchitic changes.  Scarring in the left base.  Borderline heart size.  Original Report Authenticated By: Cyndie Chime, M.D.   Ct Head Wo Contrast  04/05/2012  *RADIOLOGY REPORT*  Clinical Data: Nausea, vomiting, fall.  CT HEAD WITHOUT CONTRAST  Technique:  Contiguous axial images were obtained from the base of the skull through the vertex without contrast.  Comparison: None.  Findings: There is a small amount of blood seen in the left frontal region.  This appears to be within sulci compatible with subarachnoid blood.  There is severe atrophy.  Moderate chronic small vessel disease changes.  Ventriculomegaly noted which I suspect is related to ex vacuo dilatation.  No mass effect or midline shift.  No intraparenchymal hemorrhage.  No acute bony abnormality.  IMPRESSION: Small amount of blood in the left frontal region which appears to be extra-axial, likely subarachnoid.  Critical Value/emergent results were called by telephone at the time of interpretation on 04/05/2012  at 2:10 p.m.  to  Dr. Estell Harpin, who verbally acknowledged these results.  Original Report Authenticated By: Cyndie Chime, M.D.   Medications:  I have reviewed the patient's current medications. Prior to Admission:  Prescriptions prior to admission    Medication Sig Dispense Refill  . ALPRAZolam (XANAX) 0.25 MG tablet Take 0.25 mg by mouth at bedtime as needed. For anxiety      . aspirin 81 MG chewable tablet Chew 81 mg by mouth daily as needed. For angina      . aspirin EC 81 MG tablet Take 81 mg by mouth every morning.      . docusate sodium (COLACE) 100 MG capsule Take 100 mg by mouth daily.       . ferrous sulfate 325 (65 FE) MG tablet Take 325 mg by mouth daily with breakfast.       . furosemide (LASIX) 40 MG tablet Take 20 mg by mouth daily.      Marland Kitchen glimepiride (AMARYL) 4 MG tablet Take 4  mg by mouth daily before breakfast.      . isosorbide mononitrate (IMDUR) 30 MG 24 hr tablet Take 30-60 mg by mouth 2 (two) times daily. Take 2 in the morning and 1 in the evening      . loperamide (IMODIUM) 1 MG/5ML solution Take 3 mg by mouth 4 (four) times daily as needed. For diarrhea      . losartan (COZAAR) 100 MG tablet Take 100 mg by mouth every morning.      . metFORMIN (GLUCOPHAGE) 500 MG tablet Take 500-1,000 mg by mouth 2 (two) times daily. 2 tablets in the morning and 1 tablet in the evening      . metoprolol (LOPRESSOR) 50 MG tablet Take 50-100 mg by mouth 2 (two) times daily. 2 in the morning and 1 at night      . nitroGLYCERIN (NITROSTAT) 0.4 MG SL tablet Place 0.4 mg under the tongue every 5 (five) minutes as needed. For chest pain.      . pantoprazole (PROTONIX) 40 MG tablet Take 40 mg by mouth daily.        Marland Kitchen warfarin (COUMADIN) 2 MG tablet Take 2 mg by mouth every Tuesday, Thursday, Saturday, and Sunday at 6 PM.       . warfarin (COUMADIN) 5 MG tablet Take 5 mg by mouth every Monday, Wednesday, and Friday.        Scheduled Meds:   . docusate sodium  100 mg Oral Daily  . insulin aspart  0-9 Units Subcutaneous TID WC  . losartan  50 mg Oral Daily  . pantoprazole  40 mg Oral Daily  . sodium chloride  3 mL Intravenous Q12H  . sodium chloride  3 mL Intravenous Q12H  . DISCONTD: niMODipine  60 mg Oral Q4H   Continuous Infusions:    . sodium chloride 100 mL/hr at 04/07/12 0800   PRN Meds:.acetaminophen, acetaminophen, ALPRAZolam, hydrALAZINE, ondansetron (ZOFRAN) IV, ondansetron, oxyCODONE, sodium chloride Assessment/Plan:  Subarachnoid hemorrhage: Patient came into hospital 6 days s/p fall at home. CT head revealed small left frontal SAH. He has had no focal neruo deficits since admission. He has a h/o of falls at home when not using his walker, but no others associated with head trauma. -repeat CT head today to ensure resolve of bleed and no enlargement -continue to hold home asa and coumadin  Weakness: Patient ambulates less than he used to at home and has become more sedentary recently. When walking he uses various furniture and household objects to support him as he ambulates. He does not use his walker or cane as he should. -PT/OT recommend home health PT/OT  Dizziness: He continues to report being dizzy and lightheaded when sitting or standing. Differential includes orthostatic hypotension 2/2 low volume or his antihypertensives, decreased cerebral perfusion, or vasovagal syncope -2D echo shows mild LVH with diffuse hypokinesis, EF 35-40%, mild MVR with mildly thickened leaflets, mild LA enlargement, and mild pulmonic and tricuspid regurg -will add back antihypertensives slowly prior to d/c (currently on losartan inpt.) -add back metoprolol today, hold off on imdur until d/c likely  Hypertension: Pressures have been trending down since admission; h/o htn with sporadic use at home. To prevent orthostatic effect, adding back home regimen slowly. -continue losartan -add metoprolol -hold imdur for now  CHF:  Last echo in 2009. 2D echo repeated today which shows: -mild LVH with diffuse hypokinesis, EF 35-40%, mild MVR with mildly thickened leaflets, mild LA enlargement, and mild pulmonic and tricuspid regurg  DM: A1C  on admission 7.8; has been receiving novolog SSI while inpt. ; home DM meds on hold for now -on  glimepiride and metformin at home; will likely resume on d/c  DVT ppx: SCD's  Disposition: Patient is doing well and is stable. Pending stable repeat of CT head and improving of his dizziness, he will be ready to go home likely tomorrow.    LOS: 2 days   This is a Psychologist, occupational Note.  The care of the patient was discussed with Dr. [name] and the assessment and plan formulated with their assistance.  Please see their attached note for official documentation of the daily encounter.  Lewie Chamber 04/07/2012, 12:14 PM

## 2012-04-07 NOTE — Progress Notes (Addendum)
PGY1 Addendum I agree with excellent MS3 note above.  I have seen and examined patient with MS3. S:  No acute events overnight.  Patient continues to note dizziness upon standing, and was found to have orthostatic hypotension yesterday.  Patient had one episode of atypical chest pain overnight, resolved spontaneously, no chest pain this morning.  O:  General: alert, cooperative, and in no apparent distress HEENT: pupils equal round and reactive to light, EOMI, vision grossly intact, oropharynx clear and non-erythematous  Neck: supple, no lymphadenopathy, no JVD Lungs: clear to ascultation bilaterally, normal work of respiration, no wheezes, rales, ronchi Heart: regular rate and rhythm, no murmurs, gallops, or rubs Abdomen: soft, non-tender, non-distended, normal bowel sounds  Extremities: no cyanosis, clubbing, or edema Neurologic: alert & oriented X3, cranial nerves II-XII intact, strength 5/5 throughout, sensation intact to light touch throughout, finger-to-nose test normal  I have reviewed labs and imaging.  A/P: The patient is an 78 yo man, history of DM, CAD,afib, coumadin use (inconsistent), presenting 6 days s/p fall, with small SAH.   # Subarachnoid Hemorrhage - CT head shows small SAH in left frontal lobe, likely secondary to a fall 6 days earlier, with aspirin and coumadin usage.  -neurosurgery consulted, appreciate assistance  -nimodipine d/c'ed by neurosurgery -re-start metoprolol for HTN, continue losartan -hold aspirin, warfarin  -repeat CT head today to ensure resolving [facility]  # Fall - Patient notes a fall of unknown etiology 6 days PTA, after fasting from breakfast until 5pm, which occurred while walking. Differential includes hypoglycemia vs vasovagal syncope vs orthostatic hypotension vs vertigo vs arrythmia.  -continue telemetry, mild brady overnight -repeat echo shows worsening systolic function with EF = 35-40% -monitor CBG's, hold hypoglycemics  -PT/OT consults to  assess mobility  -TSH elevated, but free T4 normal, indicating subclinical hypothyroidism  # Nausea/dizziness - symptoms worse since a fall 6 days PTA. Symptoms likely represent orthostatic hypotension (SBP dropped from 160 to 139 from sitting to standing) vs vertigo. -monitor symptoms with PT/OT  -IV fluids for possible volume depletion leading to orthostatic hypotension  # CAD - s/p CABG 1984, 1992. Patient and his Cardiologist Patty Sermons) have decided not to perform another CABG.  Echo now shows worsening systolic function with EF = 35-40%, and diffuse inferior wall hypokinesis (not noted on echo 2009, which is surprising given CABG x2 prior to study). -hold aspirin, warfarin  # Hypertension - history of HTN, inconsistent medication usage, presenting with SBP 170-180's. Overnight, BP's have increased into the SBP 200's. Goal SBP 160.  -continue losartan  -stopped nimodipine, added metoprolol -hydralazine IV prn for SBP > 180  -hold lasix and imdur for now, may restart imdur if cp recurrs  # Chronic Systolic Congestive Heart Failure - EF 55-60% on echo 2009  -repeat echo shows EF 35-40%  # DM - A1C = 8.4 in 10/2011, now found to be 7.8  -hold oral hypoglycemics  -SSI   # GERD - chronic, stable  -continue protonix   # Prophy - SCD's, avoid lovenox/heparin given acute bleed   Signed [name], PGY1 04/07/2012 2:15 PM

## 2012-04-07 NOTE — Clinical Documentation Improvement (Signed)
CHF DOCUMENTATION CLARIFICATION QUERY  THIS DOCUMENT IS NOT A PERMANENT PART OF THE MEDICAL RECORD  Please update your documentation within the medical record to reflect your response to this query.                                                                                    04/07/12  Dear Dr.Butcher/ Associates,  In a better effort to capture your patient's severity of illness, reflect appropriate length of stay and utilization of resources, a review of the patient medical record has revealed the following indicators the diagnosis of Heart Failure.   Based on your clinical judgment, please clarify and document in a progress note and/or discharge summary the clinical condition associated with the following supporting information: In responding to this query please exercise your independent judgment.  The fact that a query is asked, does not imply that any particular answer is desired or expected.   "CHF" is documented in the progress notes. The ECHO report is now available. If any of the following conditions helps provide greater specificity for this patient, please document it in the progress note and discharge summary. THANK YOU!  [facility]: The acuity and type of CHF should be documented when known [acute, chronic, acute on chronic, systolic, diastolic, systolic and diastolic].  Possible Clinical Conditions?  - Chronic Systolic Congestive Heart Failure  - Chronic Diastolic Congestive Heart Failure  - Chronic Systolic & Diastolic Congestive Heart Failure  - Other condition (please document in the progress notes and/or discharge summary)  - Cannot Clinically determine at this time   Supporting Information:  - Echo 4/19 =  Study Conclusions : - Left ventricle: Diffuse hypokinesis worse in the inferior wall The cavity size was mildly to moderately dilated. Wall thickness was increased in a pattern of mild LVH. Systolic function was moderately reduced. The estimated ejection  fraction was in the range of 35% to 40%. Diffuse hypokinesis. - Mitral valve: Mild regurgitation. - Left atrium: The atrium was mildly dilated. - Atrial septum: No defect or patent foramen ovale was identified.  - "Chronic CHF - repeat ECHO" H&P   Reviewed: additional documentation in the medical record  Thank You,  Saul Fordyce  Clinical Documentation Specialist: [phone] Pager  Health Information Management [place]   TO RESPOND TO THE THIS QUERY, FOLLOW THE INSTRUCTIONS BELOW:  1. If needed, update documentation for the patient's encounter via the notes activity.  2. Access this query again and click edit on the In [employer].  3. After updating, or not, click F2 to complete all highlighted (required) fields concerning your review. Select "additional documentation in the medical record" OR "no additional documentation provided".  4. Click Sign note button.  5. The deficiency will fall out of your In Basket *Please let us know if you are not able to complete this workflow by phone or e-mail (listed below).

## 2012-04-07 NOTE — Progress Notes (Signed)
NT reported BP 193/99. Rechecked manually 20 minutes later 188/106. Hydralazine given per PRN order. Will cont to monitor.

## 2012-04-07 NOTE — Progress Notes (Signed)
Physical Therapy Note   04/07/12 1300  PT Visit Information  Last PT Received On 04/07/12  PT Time Calculation  PT Start Time 1115  PT Stop Time 1140  PT Time Calculation (min) 25 min  Subjective Data  Subjective This is my Dtr.    Precautions  Precautions Fall  Restrictions  Weight Bearing Restrictions No  Cognition  Overall Cognitive Status Appears within functional limits for tasks assessed/performed  Transfers  Sit to Stand 5: Supervision;With upper extremity assist;From chair/3-in-1  Stand to Sit 4: Min guard;With upper extremity assist;To chair/3-in-1  Details for Transfer Assistance cues to control descent and get closer to chair.    Ambulation/Gait  Ambulation/Gait Assistance 4: Min assist  Ambulation Distance (Feet) 150 Feet  Assistive device Rolling walker  Ambulation/Gait Assistance Details cues for upright posture, positioning in RW, increasing stride length  Gait Pattern Festinating;Trunk flexed;Narrow base of support  Stairs No  Wheelchair Mobility  Wheelchair Mobility No  Balance  Balance Assessed No  PT - End of Session  Equipment Utilized During Treatment Gait belt  Activity Tolerance Patient tolerated treatment well  Patient left in chair;with call bell/phone within reach;with family/visitor present  Nurse Communication Mobility status  PT - Assessment/Plan  Comments on Treatment Session pt presents generally weak and with a history of falls.  pt with festinating gait and occasional freezing episodes needing cues for resuming gait.  pt will have 24hr A at D/C with HHPT.    PT Plan Discharge plan remains appropriate;Frequency remains appropriate  PT Frequency Min 3X/week  Follow Up Recommendations Home health PT;Supervision/Assistance - 24 hour  Equipment Recommended None recommended by PT  Acute Rehab PT Goals  PT Goal: Sit to Stand - Progress Progressing toward goal  PT Goal: Stand to Sit - Progress Progressing toward goal  PT Goal: Ambulate - Progress  Progressing toward goal    Mack Hook, PT [phone]

## 2012-04-07 NOTE — Progress Notes (Signed)
Nursing- when entering room to give patient scheduled medications, the patient stated "I'm having some discomfort in my chest." Pt described pain as "not like a heart attack," and "sharp". Pt sat on the side of bed and stated that the discomfort was no longer there. MD notified of pt complaints. NT is now performing EKG that had been previously ordered to be completed today. No new orders received for patient. Pt told to call if pain returns. Will cont to monitor.

## 2012-04-07 NOTE — Progress Notes (Signed)
  Echocardiogram 2D Echocardiogram has been performed.  Benjamin Noble 04/07/2012, 10:02 AM

## 2012-04-07 NOTE — Progress Notes (Signed)
Internal Medicine Teaching Service Attending Note Date: 04/07/2012  Patient name: Benjamin Noble  Medical record number: [id number]  Date of birth: Apr 23, 1922    This patient has been seen and discussed with the house staff. Please see their note for complete details. I concur with their findings with the following additions/corrections:  Mr Upshaw is sitting in a chair. His daughter is visiting. He has completed the ECHO and we will obtain a repeat CT today. If he continues to do well, he can be D/C'd over the weekend - likely the 20th. BP meds adjusted. Home PT/OT. They already have a 24 hour aide.  Eppie Barhorst 04/07/2012, 12:14 PM

## 2012-04-08 LAB — GLUCOSE, CAPILLARY
Glucose-Capillary: 171 mg/dL — ABNORMAL HIGH (ref 70–99)
Glucose-Capillary: 153 mg/dL — ABNORMAL HIGH (ref 70–99)

## 2012-04-08 LAB — CBC
HCT: 40.2 % (ref 39.0–52.0)
Hemoglobin: 13.9 g/dL (ref 13.0–17.0)
RBC: 4.49 MIL/uL (ref 4.22–5.81)
RDW: 13 % (ref 11.5–15.5)
WBC: 6.3 10*3/uL (ref 4.0–10.5)

## 2012-04-08 LAB — BASIC METABOLIC PANEL
CO2: 22 mEq/L (ref 19–32)
Chloride: 104 mEq/L (ref 96–112)
GFR calc Af Amer: 76 mL/min — ABNORMAL LOW (ref >90)
Potassium: 4 mEq/L (ref 3.5–5.1)

## 2012-04-08 MED ORDER — ISOSORBIDE MONONITRATE ER 30 MG PO TB24: 30.0000 mg | ORAL_TABLET | Freq: Two times a day (BID) | ORAL | Status: DC

## 2012-04-08 MED ORDER — FUROSEMIDE 20 MG PO TABS: 20.0000 mg | ORAL_TABLET | Freq: Every day | ORAL | Status: DC

## 2012-04-08 NOTE — Discharge Summary (Signed)
Internal Medicine Teaching [facility] Discharge Note  Name: Benjamin Noble MRN: [id number] DOB: 08-Apr-1922 78 y.o.  Date of Admission: 04/05/2012 11:27 AM Date of Discharge: 04/08/2012 Attending Physician: Burns Spain, MD  Discharge Diagnosis: 1. Subarachnoid hemorrhage 2. Fall 3. Vertigo 4. Hypertension 5. CAD 6. Chronic Systolic CHF 7. DM 8. GERD  Discharge Medications: Medication List  As of 04/08/2012  1:02 PM   STOP taking these medications         aspirin 81 MG chewable tablet      aspirin EC 81 MG tablet      glimepiride 4 MG tablet      losartan 50 MG tablet      warfarin 2 MG tablet      warfarin 5 MG tablet         TAKE these medications         ALPRAZolam 0.25 MG tablet   Commonly known as: XANAX   Take 0.25 mg by mouth at bedtime as needed. For anxiety      docusate sodium 100 MG capsule   Commonly known as: COLACE   Take 100 mg by mouth daily.      ferrous sulfate 325 (65 FE) MG tablet   Take 325 mg by mouth daily with breakfast.      furosemide 20 MG tablet   Commonly known as: LASIX   Take 1 tablet (20 mg total) by mouth daily.      isosorbide mononitrate 30 MG 24 hr tablet   Commonly known as: IMDUR   Take 1 tablet (30 mg total) by mouth 2 (two) times daily.      loperamide 1 MG/5ML solution   Commonly known as: IMODIUM   Take 3 mg by mouth 4 (four) times daily as needed. For diarrhea      losartan 100 MG tablet   Commonly known as: COZAAR   Take 100 mg by mouth every morning.      metFORMIN 500 MG tablet   Commonly known as: GLUCOPHAGE   Take 500-1,000 mg by mouth 2 (two) times daily. 2 tablets in the morning and 1 tablet in the evening      metoprolol 50 MG tablet   Commonly known as: LOPRESSOR   Take 50-100 mg by mouth 2 (two) times daily. 2 in the morning and 1 at night      nitroGLYCERIN 0.4 MG SL tablet   Commonly known as: NITROSTAT   Place 0.4 mg under the tongue every 5 (five) minutes as needed. For  chest pain.      pantoprazole 40 MG tablet   Commonly known as: PROTONIX   Take 40 mg by mouth daily.           Disposition and follow-up:   Mr.Benjamin Noble was discharged from [facility] in Stable condition.  He will be discharged home with home health PT and OT to follow.  He also has 24/7 care at home with an aide for he and his wife.  HE has a follow up with Dr. Patty Sermons on 04/18/12 at 8:30 am.  He should be maintained off his ASA and coumadin for a total of two weeks, to restart on on 04/19/12.  At his follow up visit he should be assessed for any continued delirium signs or headaches, orthostasis, or falls.    Follow-up Appointments: Follow-up Information    Follow up with Cassell Clement, MD on 04/18/2012. (Appointment time 8:30am)    Contact  information:   [address] [place] [address] [phone]         Discharge Orders    Future Appointments: Provider: Department: Dept Phone: Center:   04/18/2012 8:30 AM Rosalio Macadamia, NP [facility] [phone] None   05/29/2012 2:00 PM Cassell Clement, MD [facility] [phone] None     Future Orders Please Complete By Expires   Diet - low sodium heart healthy      Increase activity slowly      Discharge instructions      Comments:   1.  Stop the coumadin and aspirin until your follow up with Dr. Patty Sermons.  2.  I will send a note to Dr. Patty Sermons to have his nurses call you with an appointment in about 2 weeks.  3.  We changed the dose on several of your blood pressure medication.  Please read the list carefully and take the medications according to this list.  4.  When you are up walking please make sure you are using your walker.  Physical therapy and Occupational therapy will be coming to help you at home.  5.  If you have a sudden onset of a severe headache please call for help.   Call MD for:  difficulty breathing, headache or visual disturbances       Call MD for:  persistant dizziness or light-headedness      (HEART FAILURE PATIENTS) Call MD:  Anytime you have any of the following symptoms: 1) 3 pound weight gain in 24 hours or 5 pounds in 1 week 2) shortness of breath, with or without a dry hacking cough 3) swelling in the hands, feet or stomach 4) if you have to sleep on extra pillows at night in order to breathe.        Consultations: Neurosurgery  Procedures Performed:  Dg Chest 2 View  04/05/2012  *RADIOLOGY REPORT*  Clinical Data: Dizziness, weakness.  CHEST - 2 VIEW  Comparison: None  Findings: Prior median sternotomy.  Heart is borderline in size. Mild peribronchial thickening.  Scarring in the left lung base.  No confluent opacity in the right.  No effusions or acute bony abnormality.  IMPRESSION: Bronchitic changes.  Scarring in the left base.  Borderline heart size.  Original Report Authenticated By: Cyndie Chime, M.D.   Ct Head Wo Contrast  04/07/2012  *RADIOLOGY REPORT*  Clinical Data: Follow-up subarachnoid hemorrhage.  CT HEAD WITHOUT CONTRAST  Technique:  Contiguous axial images were obtained from the base of the skull through the vertex without contrast.  Comparison: CT head without contrast 04/05/2012.  Findings: Subarachnoid blood in the high left frontal lobe is less conspicuous than on the previous scan.  No new hemorrhages evident. Ventricular enlargement is proportionate to the degree of atrophy and stable.  Mild confluent white matter hypoattenuation is evident.  The paranasal sinuses and mastoid air cells are clear.  IMPRESSION:  1.  Resolving the high left frontal lobe hemorrhage. 2.  No new hemorrhage. 3.  Advanced atrophy and white matter disease likely reflects the sequelae of chronic microvascular ischemia.  Original Report Authenticated By: Jamesetta Orleans. MATTERN, M.D.   Ct Head Wo Contrast  04/05/2012  *RADIOLOGY REPORT*  Clinical Data: Nausea, vomiting, fall.  CT HEAD WITHOUT CONTRAST  Technique:  Contiguous  axial images were obtained from the base of the skull through the vertex without contrast.  Comparison: None.  Findings: There is a small amount of blood seen in the left frontal  region.  This appears to be within sulci compatible with subarachnoid blood.  There is severe atrophy.  Moderate chronic small vessel disease changes.  Ventriculomegaly noted which I suspect is related to ex vacuo dilatation.  No mass effect or midline shift.  No intraparenchymal hemorrhage.  No acute bony abnormality.  IMPRESSION: Small amount of blood in the left frontal region which appears to be extra-axial, likely subarachnoid.  Critical Value/emergent results were called by telephone at the time of interpretation on 04/05/2012  at 2:10 p.m.  to  Dr. Estell Harpin, who verbally acknowledged these results.  Original Report Authenticated By: Cyndie Chime, M.D.   2D Echo: Study Conclusions  - Left ventricle: Diffuse hypokinesis worse in the inferior wall The cavity size was mildly to moderately dilated. Wall thickness was increased in a pattern of mild LVH. Systolic function was moderately reduced. The estimated ejection fraction was in the range of 35% to 40%. Diffuse hypokinesis. - Mitral valve: Mild regurgitation. - Left atrium: The atrium was mildly dilated. - Atrial septum: No defect or patent foramen ovale was identified.  Admission HPI: The patient is an 78 yo man, history of HTN, DM (A1C = 8.4), HL, CHF (EF 55-60%), CAD (s/p CABG x2), ?afib on coumadin, presenting with dizziness following a fall. The patient was in his normal state of health 6 days PTA, when, after not eating since breakfast, the patient fell around 5pm while walking back from the bathroom, not using his walker. He notes no preceding symptoms of dizziness, LH, altered sensorium, visual changes, or unusual taste in his mouth, and no tongue biting, loss of bowel contents, or confusion following the event (incontinent of bladder at baseline). The patient's  sitter heard the patient fall, and found him on his back on the floor. The patient did not seek medical attention. Since that time, he has noted "dizziness", described as a sensation of the room spinning, along with symptoms of mild nausea, though no vomiting. He noted a posterior headache on the day of the event, but no headache since that time. No focal weakness/numbness, vision/hearing changes, slurring of speech. On presentation to the ED, head CT shows a small subarachnoid left frontal bleed. The patient is maintained on aspirin and warfarin, though notes occasional non-compliance with warfarin, and presents with an INR of 1.18.  Hospital Course by problem list: 1. Fall:  Mr. Tugwell presented to [facility] ED after falling 5 days prior to admission.  He had not eaten breakfast or lunch but continued to take his glipizide.  He did not have any preceding symptoms, post-ictal state, or loss of bowel or bladder control.  He had a mild headache the day after but since then has noticed that he has had some sensation of the room spinning.  He has a walker but does not use it at home and has fallen before.  CT scan of the head showed a small right frontal subarachnoid hemorrhage.  During his stay, PT and OT were consulted and suggested home health PT and OT with 24 hour supervision which was already in place at the home.    2. Subarachnoid hemorrhage: Admission CT scan of the head after his fall showed a small right frontal subarachnoid hemorrhage.  He is on chronic coumadin and aspirin for CAD s/p CABG x2 with known blockage of the bypasses.  These were held and neurosurgery was consulted.  Neurosurgery suggested watchful waiting, controlling the blood pressure, and rechecking the CT scan in a few days.  Repeat CT scan showed the beginnings of resolution of the subarachnoid hemorrhage and no evidence of rebleed.  The plan will be to hold his ASA and coumadin for 2 weeks and then restart it.   3. Vertigo: On  admission Mr. Helzer was orthostatic by SBP.  His lasix was held and he was gently hydrated.  On the date of discharge his orthstatic hypotension had resolved but he continued to a have a very mild sensation of vertigo when standing.  He had no nystagmus and was able to work with PT safely with his walker so it was felt we could discharge him with close follow up and continued working with PT and OT for his strength, balance, and home safety.  He was encouraged to continue to use his walker at home for safety.   4. Hypertension:  On admission he was mildly hypertensive and volume contracted.  He noted that he takes his medications inconsistently.  He was started on half of his home dose of Losartan 100 mg daily and his home metoprolol, imdur, and lasix were held.  With hydration his blood pressure did become hypertensive and his pulse tachycardic so his metoprolol was restarted and on discharge he was returned to his home hypertensive regimen.   5. CAD:  Mr. Maret has had 2 CABGs and was scheduled for a 3rd when it was decided that he was not a good surgical candidate.  He was maintained on coumadin and ASA and medically managed for angina with Imdur.  He did have one episode of chest pain that promptly resolved with nitro.  He had no EKG changes during that episode.  6. Chronic Systolic CHF: He has a history of chronic systolic CHF that is medically managed and likely secondary to ischemic cardiomyopathy.  With his episode of syncope we repeated his echocardiogram.  His EF had decreased from 50-55% to 35-40% with some inferior wall akinesis.  He is followed by Dr. Patty Sermons and will be returning to him.  He may be a candidate for ICD placement.  He had no episodes of arrythmia on his telemetry monitor during his stay.  7. DM:  He has a history of diabetes managed on Metformin and glipizide.  He was managed with SSI during his stay and HgBA1C 7.8.  On discharge with his risk of hypoglycemia we will  hold his glipizide but continue the metformin and recheck his HgBA1C in about 3 months.  8. GERD:  He was managed on protonix throughout his stay.  Discharge Vitals:  BP 156/85  Pulse 80  Temp(Src) 97.9 F (36.6 C) (Oral)  Resp 20  Ht 5\' 9"  (1.753 m)  Wt 170 lb 10.2 oz (77.4 kg)  BMI 25.20 kg/m2  SpO2 95%  Discharge Labs:  Results for orders placed during the hospital encounter of 04/05/12 (from the past 24 hour(s))  GLUCOSE, CAPILLARY     Status: Abnormal   Collection Time   04/07/12  3:52 PM      Component Value Range   Glucose-Capillary 136 (*) 70 - 99 (mg/dL)  GLUCOSE, CAPILLARY     Status: Abnormal   Collection Time   04/07/12  9:58 PM      Component Value Range   Glucose-Capillary 171 (*) 70 - 99 (mg/dL)   Comment 1 Documented in Chart     Comment 2 Notify RN    BASIC METABOLIC PANEL     Status: Abnormal   Collection Time   04/08/12  5:39 AM  Component Value Range   Sodium 136  135 - 145 (mEq/L)   Potassium 4.0  3.5 - 5.1 (mEq/L)   Chloride 104  96 - 112 (mEq/L)   CO2 22  19 - 32 (mEq/L)   Glucose, Bld 154 (*) 70 - 99 (mg/dL)   BUN 16  6 - 23 (mg/dL)   Creatinine, Ser 3.08  0.50 - 1.35 (mg/dL)   Calcium 8.9  8.4 - 65.7 (mg/dL)   GFR calc non Af Amer 65 (*) >90 (mL/min)   GFR calc Af Amer 76 (*) >90 (mL/min)  CBC     Status: Normal   Collection Time   04/08/12  5:39 AM      Component Value Range   WBC 6.3  4.0 - 10.5 (K/uL)   RBC 4.49  4.22 - 5.81 (MIL/uL)   Hemoglobin 13.9  13.0 - 17.0 (g/dL)   HCT 84.6  96.2 - 95.2 (%)   MCV 89.5  78.0 - 100.0 (fL)   MCH 31.0  26.0 - 34.0 (pg)   MCHC 34.6  30.0 - 36.0 (g/dL)   RDW 84.1  32.4 - 40.1 (%)   Platelets 223  150 - 400 (K/uL)  GLUCOSE, CAPILLARY     Status: Abnormal   Collection Time   04/08/12  6:56 AM      Component Value Range   Glucose-Capillary 153 (*) 70 - 99 (mg/dL)   Comment 1 Documented in Chart     Comment 2 Notify RN      Signed: Ivett Luebbe 04/08/2012, 1:02 PM   Time Spent on  Discharge: 40 min.

## 2012-04-08 NOTE — Progress Notes (Signed)
Subjective: Sitting up in the chair this AM.  States he thinks he was confused and dreaming last night.  Feels good.  Still has mild vertigo sensation when he stands but denies lightheadedness now.  States he didn't sleep too well last night.  His appetite is good.  Family was by to visit this AM.   Objective: Vital signs in last 24 hours: Filed Vitals:   04/08/12 0952 04/08/12 0953 04/08/12 0954 04/08/12 1041  BP: 166/109 166/84 181/98 156/85  Pulse: 84 92 83 80  Temp:    97.9 F (36.6 C)  TempSrc:    Oral  Resp: 20 18 20 20   Height:      Weight:      SpO2: 97% 98% 96% 95%   Weight change:   Intake/Output Summary (Last 24 hours) at 04/08/12 1243 Last data filed at 04/08/12 0807  Gross per 24 hour  Intake   1200 ml  Output    200 ml  Net   1000 ml   Physical Exam: Vitals reviewed. General: resting in bed, NAD HEENT: PERRL, EOMI, no scleral icterus Cardiac: tachycardic but regular with occasional PVCs, no rubs, murmurs or gallops Pulm: clear to auscultation bilaterally, no wheezes, rales, or rhonchi Abd: soft, nontender, nondistended, BS present Ext: warm and well perfused, no pedal edema Neuro: alert and oriented X3, cranial nerves II-XII grossly intact, strength and sensation to light touch equal in bilateral upper and lower extremities  Lab Results: Basic Metabolic Panel:  Lab 04/08/12 4782 04/07/12 0635  NA 136 135  K 4.0 4.0  CL 104 100  CO2 22 25  GLUCOSE 154* 155*  BUN 16 20  CREATININE 1.00 1.09  CALCIUM 8.9 9.0  MG -- --  PHOS -- --   Liver Function Tests:  Lab 04/05/12 1300  AST 15  ALT 7  ALKPHOS 99  BILITOT 0.5  PROT 7.1  ALBUMIN 3.3*   CBC:  Lab 04/08/12 0539 04/07/12 0635 04/05/12 1300  WBC 6.3 6.6 --  NEUTROABS -- -- 3.9  HGB 13.9 14.6 --  HCT 40.2 41.8 --  MCV 89.5 89.7 --  PLT 223 213 --   Cardiac Enzymes:  Lab 04/06/12 0902 04/06/12 0249 04/05/12 2052  CKTOTAL 27 25 22   CKMB 2.1 1.8 1.7  CKMBINDEX -- -- --  TROPONINI <0.30  <0.30 <0.30   CBG:  Lab 04/08/12 0656 04/07/12 2158 04/07/12 1552 04/07/12 1129 04/07/12 0626 04/06/12 2148  GLUCAP 153* 171* 136* 201* 148* 152*   Hemoglobin A1C:  Lab 04/05/12 2052  HGBA1C 7.8*   Thyroid Function Tests:  Lab 04/06/12 0902 04/05/12 2052  TSH -- 5.783*  T4TOTAL -- --  FREET4 1.13 --  T3FREE -- --  THYROIDAB -- --   Coagulation:  Lab 04/05/12 1300  LABPROT 15.3*  INR 1.18   Urinalysis:  Lab 04/05/12 1259  COLORURINE YELLOW  LABSPEC 1.020  PHURINE 5.5  GLUCOSEU 250*  HGBUR TRACE*  BILIRUBINUR NEGATIVE  KETONESUR NEGATIVE  PROTEINUR 30*  UROBILINOGEN 1.0  NITRITE NEGATIVE  LEUKOCYTESUR NEGATIVE   Micro Results: Recent Results (from the past 240 hour(s))  URINE CULTURE     Status: Normal   Collection Time   04/05/12 12:59 PM      Component Value Range Status Comment   Specimen Description URINE, CATHETERIZED   Final    Special Requests NONE   Final    Culture  Setup Time [id number]   Final    Colony Count NO GROWTH   Final  Culture NO GROWTH   Final    Report Status 04/06/2012 FINAL   Final    Studies/Results: Ct Head Wo Contrast  04/07/2012  *RADIOLOGY REPORT*  Clinical Data: Follow-up subarachnoid hemorrhage.  CT HEAD WITHOUT CONTRAST  Technique:  Contiguous axial images were obtained from the base of the skull through the vertex without contrast.  Comparison: CT head without contrast 04/05/2012.  Findings: Subarachnoid blood in the high left frontal lobe is less conspicuous than on the previous scan.  No new hemorrhages evident. Ventricular enlargement is proportionate to the degree of atrophy and stable.  Mild confluent white matter hypoattenuation is evident.  The paranasal sinuses and mastoid air cells are clear.  IMPRESSION:  1.  Resolving the high left frontal lobe hemorrhage. 2.  No new hemorrhage. 3.  Advanced atrophy and white matter disease likely reflects the sequelae of chronic microvascular ischemia.  Original Report  Authenticated By: Jamesetta Orleans. MATTERN, M.D.   Medications: I have reviewed the patient's current medications. Scheduled Meds:   . docusate sodium  100 mg Oral Daily  . insulin aspart  0-9 Units Subcutaneous TID WC  . losartan  50 mg Oral Daily  . metoprolol tartrate  50 mg Oral BID  . pantoprazole  40 mg Oral Daily  . sodium chloride  3 mL Intravenous Q12H  . sodium chloride  3 mL Intravenous Q12H   Continuous Infusions:   . DISCONTD: sodium chloride 100 mL/hr at 04/08/12 0700   PRN Meds:.acetaminophen, acetaminophen, ALPRAZolam, hydrALAZINE, ondansetron (ZOFRAN) IV, ondansetron, oxyCODONE, sodium chloride  Assessment/Plan: 1.  Subarachnoid Hemorrhage - CT head shows small SAH in left frontal lobe, likely secondary to a fall 6 days earlier, with aspirin and coumadin usage. Repeat CT showed resolving SAH with no new bleeds noted.  Plan to hold his coumadin for 2 weeks total before restarting.   -neurosurgery consult, appreciate assistance  -control BP  -hold aspirin, warfarin   2. Fall - Patient notes a fall of unknown etiology 6 days PTA, after fasting from breakfast until 5pm, which occurred while walking. Differential includes hypoglycemia vs vasovagal syncope vs orthostatic hypotension vs vertigo vs arrythmia.  -continue telemetry, no events overnight. No bradycardia or pauses. - Echo shows diffuse hypokinesis with mild to mod LVH.  EF of 35-40%.   -monitor CBG's, likely hold glipizide as an outpatient but continue metformin.  -PT/OT consults suggested [facility] with 24/7 supervision.   -TSH elevated, but Free t4 normal.   3. Nausea/dizziness - symptoms worse since a fall 6 days PTA. Symptoms likely represent SAH vs vertigo. Will avoid checking dix-halpike in the setting of acute SAH.  -monitor symptoms with PT/OT   4. Hypertension - history of HTN, inconsistent medication usage, presenting with SBP 170-180's. Overnight, BP's have increased into the SBP 200's. Goal SBP 160.    -continue losartan  - restarted Metoprolol 50 BID - Likely restart Lasix 20 mg, Imdur, and metoprolol home dose at discharge.  5. CHF - EF 55-60% on echo 2009  - results above.  Shows progression that is to be expected with his known CAD that is non-operative.   6. DM - A1C = 8.4 in 10/2011, now found to be 7.8  -hold oral hypoglycemics  -SSI   7. CAD - s/p CABG 1984, 1992. Patient and his Cardiologist Patty Sermons) have decided not to perform another CABG.  -hold aspirin, likely okay to restart around the time of the coumadin.   8. GERD - chronic, stable  -continue protonix   9.  Prophy - SCD's, avoid lovenox/heparin given acute bleed   LOS: 3 days   Terri Malerba 04/08/2012, 12:43 PM

## 2012-04-08 NOTE — Discharge Instructions (Signed)
1.  Stop the coumadin and aspirin until your follow up with Dr. Patty Sermons.  2.  I will send a note to Dr. Patty Sermons to have his nurses call you with an appointment in about 2 weeks.  3.  We changed the dose on several of your blood pressure medication.  Please read the list carefully and take the medications according to this list.  4.  When you are up walking please make sure you are using your walker.  Physical therapy and Occupational therapy will be coming to help you at home.  5.  If you have a sudden onset of a severe headache please call for help.

## 2012-04-08 NOTE — Progress Notes (Signed)
Nursing-Patient became increasing confused this evening as well as irritable and verbally aggressive. Pt A+0 x 3- disoriented to place.

## 2012-04-10 NOTE — Progress Notes (Signed)
Late entry: Noted pt d/c on Saturday 04/08/2012, list of [facility] providers given to daughter on Friday 04/07/2012, however family had not selected HH agency at time of d/c. This CM called daughter this am and family had selected [facility] for HHPT/OT. Daughter reported some confusion in her father, so [facility] was asked to send a Charity fundraiser for assessment and evaluation.  Johny Shock RN MPH Case Manager [phone]

## 2012-04-11 DIAGNOSIS — R55 Syncope and collapse: Secondary | ICD-10-CM

## 2012-04-11 DIAGNOSIS — I609 Nontraumatic subarachnoid hemorrhage, unspecified: Secondary | ICD-10-CM

## 2012-04-11 NOTE — ED Provider Notes (Signed)
Medical screening examination/treatment/procedure(s) were conducted as a shared visit with non-physician practitioner(s) and myself.  I personally evaluated the patient during the encounter   [name], MD 04/11/12 0630 

## 2012-04-18 ENCOUNTER — Encounter: Payer: Medicare Other | Admitting: Nurse Practitioner

## 2012-04-19 ENCOUNTER — Ambulatory Visit (INDEPENDENT_AMBULATORY_CARE_PROVIDER_SITE_OTHER): Payer: Medicare Other | Admitting: Nurse Practitioner

## 2012-04-19 ENCOUNTER — Encounter: Payer: Self-pay | Admitting: Nurse Practitioner

## 2012-04-19 VITALS — BP 132/90 | HR 74 | Ht 69.0 in | Wt 170.0 lb

## 2012-04-19 DIAGNOSIS — R55 Syncope and collapse: Secondary | ICD-10-CM

## 2012-04-19 DIAGNOSIS — I609 Nontraumatic subarachnoid hemorrhage, unspecified: Secondary | ICD-10-CM

## 2012-04-19 NOTE — Assessment & Plan Note (Signed)
Patient presents post hospital after fall/possible syncope with resultant subarachnoid hemorrhage. Seems to be doing ok. I think he will need a CT scan that shows resolution prior to restarting his aspirin/Coumadin. ? Of even restarting the coumadin. Dr. Patty Sermons and I will see him back in 2 weeks with a repeat CT scan of the head.

## 2012-04-19 NOTE — Assessment & Plan Note (Signed)
He continues to complain of dizziness. Has apparently had one recurrent episode which fortunately did not result in injury. I have placed an event monitor for now. Further disposition to follow. His other problems seem stable at this time but his overall prognosis looks rather guarded to me. Patient is agreeable to this plan and will call if any problems develop in the interim.

## 2012-04-19 NOTE — Patient Instructions (Signed)
We will get another CT scan in about 2 weeks to reassess the bleeding in your head. Dr. Patty Sermons and I will see you back later that day to discuss with you.  We are not restarting the Coumadin or aspirin yet  We will put a monitor on to watch your rhythm for the next month.  Call the [facility] office at [phone] if you have any questions, problems or concerns.

## 2012-04-19 NOTE — Progress Notes (Signed)
[name] Date of Birth: 03/01/1922 Medical Record #[id number]  History of Present Illness: Mr. Regas is seen back today for a post hospital visit. He is seen for Dr. Patty Sermons. He is an 78 year old male with multiple medical issues. These include known CAD with remote CABG and redo CABG last in 1992. He does have chronic angina but is managed medically. He has HTN, LV dysfunction, HTN, DM and GERD. He has been on aspirin and coumadin for many years.  He comes in today. He is here with his son. He fell earlier this month. His son reports that he passed out and fell. He did hit his head and was found to have a subarachnoid hemorrhage. His coumadin and aspirin were stopped. He did not require surgical intervention. His son reports that he has been on his coumadin for his "blockages". No report of atrial fib. He has been home about 10 days. Remains fragile. Has had one recurrent episode that was witnessed by his son. They were in the car and the son noted that Mr. Ging had a "blank stare" on his face and did not respond for about 2 to 3 minutes. He denies any palpitations. Does say he is dizzy. No more falls. Does have some occasional chest pain but it seems to be at baseline for him. No headaches reported. He does have around the clock care for both he and his wife. His last CT just showed resolving hemorrhage.   Current Outpatient Prescriptions on File Prior to Visit  Medication Sig Dispense Refill  . docusate sodium (COLACE) 100 MG capsule Take 100 mg by mouth daily.       . ferrous sulfate 325 (65 FE) MG tablet Take 325 mg by mouth daily with breakfast.       . furosemide (LASIX) 20 MG tablet Take 1 tablet (20 mg total) by mouth daily.  30 tablet  1  . isosorbide mononitrate (IMDUR) 30 MG 24 hr tablet Take 1 tablet (30 mg total) by mouth 2 (two) times daily.  60 tablet  1  . losartan (COZAAR) 100 MG tablet Take 100 mg by mouth every morning.      . metFORMIN (GLUCOPHAGE) 500  MG tablet Take 500-1,000 mg by mouth 2 (two) times daily. 2 tablets in the morning and 1 tablet in the evening      . metoprolol (LOPRESSOR) 50 MG tablet Take 50-100 mg by mouth 2 (two) times daily. 2 in the morning and 1 at night      . nitroGLYCERIN (NITROSTAT) 0.4 MG SL tablet Place 0.4 mg under the tongue every 5 (five) minutes as needed. For chest pain.      . pantoprazole (PROTONIX) 40 MG tablet Take 40 mg by mouth daily.          Allergies  Allergen Reactions  . Penicillins Anaphylaxis and Swelling  . Lipitor (Atorvastatin Calcium) Other (See Comments)    Gi issues  . Macrodantin Other (See Comments)    Unknown   . Morphine And Related Other (See Comments)    "doesn't feel right"  . Ranolazine Er Other (See Comments)    constipation  . Statins Other (See Comments)    Leg weakness    Past Medical History  Diagnosis Date  . Ischemic heart disease     original CABG in 1984 with redo CABG in 1992; managed medically  . HTN (hypertension)   . Angina pectoris   . Hypercholesterolemia   . CHF (congestive  heart failure)     EF is 35 to 40% per echo April 2013  . Arthritis   . Urinary incontinence   . Myocardial infarction   . Dysrhythmia     irregular  heart beats per patient  . Asthma   . Shortness of breath   . Diabetes mellitus     type 2  . GERD (gastroesophageal reflux disease)   . Neuromuscular disorder     periferal neruropathy  . Depression   . Syncope   . Subarachnoid hemorrhage April 2013    from fall/coumadin & aspirin stopped    Past Surgical History  Procedure Date  . Cardiac catheterization 03/18/1998  . Coronary artery bypass graft 1610,9604    first at [facility]  . Circumcision 02/05/08  . Eye surgery   . Cardiac stents     History  Smoking status  . Never Smoker   Smokeless tobacco  . Never Used    History  Alcohol Use No    Family History  Problem Relation Age of Onset  . Heart attack Father     Review of Systems: The  review of systems is per the HPI.  All other systems were reviewed and are negative.  Physical Exam: BP 132/90  Pulse 74  Ht 5\' 9"  (1.753 m)  Wt 170 lb (77.111 kg)  BMI 25.10 kg/m2 Patient is an elderly male who looks chronically ill. He is in a wheelchair. He does not appear to be in no acute distress. He is alert and responds appropriately. Skin is warm and dry. Color is normal.  HEENT is unremarkable. Normocephalic/atraumatic. PERRL. Sclera are nonicteric. Neck is supple. No masses. No JVD. Lungs are clear. Cardiac exam shows a regular rate and rhythm. Abdomen is soft. Extremities are without edema. Gait is not tested. No gross neurologic deficits noted.   LABORATORY DATA: Lab Results  Component Value Date   WBC 6.3 04/08/2012   HGB 13.9 04/08/2012   HCT 40.2 04/08/2012   PLT 223 04/08/2012   GLUCOSE 154* 04/08/2012   CHOL 153 07/26/2011   TRIG 86.0 07/26/2011   HDL 38.70* 07/26/2011   LDLCALC 97 07/26/2011   ALT 7 04/05/2012   AST 15 04/05/2012   NA 136 04/08/2012   K 4.0 04/08/2012   CL 104 04/08/2012   CREATININE 1.00 04/08/2012   BUN 16 04/08/2012   CO2 22 04/08/2012   TSH 5.783* 04/05/2012   INR 1.18 04/05/2012   HGBA1C 7.8* 04/05/2012   Echo Study Conclusions 04/07/2012  - Left ventricle: Diffuse hypokinesis worse in the inferior wall The cavity size was mildly to moderately dilated. Wall thickness was increased in a pattern of mild LVH. Systolic function was moderately reduced. The estimated ejection fraction was in the range of 35% to 40%. Diffuse hypokinesis. - Mitral valve: Mild regurgitation. - Left atrium: The atrium was mildly dilated. - Atrial septum: No defect or patent foramen ovale was identified.   Dg Chest 2 View  04/05/2012  *RADIOLOGY REPORT*  Clinical Data: Dizziness, weakness.  CHEST - 2 VIEW  Comparison: None  Findings: Prior median sternotomy.  Heart is borderline in size. Mild peribronchial thickening.  Scarring in the left lung base.  No confluent opacity in the  right.  No effusions or acute bony abnormality.  IMPRESSION: Bronchitic changes.  Scarring in the left base.  Borderline heart size.  Original Report Authenticated By: Cyndie Chime, M.D.   Ct Head Wo Contrast  04/07/2012  *RADIOLOGY REPORT*  Clinical  Data: Follow-up subarachnoid hemorrhage.  CT HEAD WITHOUT CONTRAST  Technique:  Contiguous axial images were obtained from the base of the skull through the vertex without contrast.  Comparison: CT head without contrast 04/05/2012.  Findings: Subarachnoid blood in the high left frontal lobe is less conspicuous than on the previous scan.  No new hemorrhages evident. Ventricular enlargement is proportionate to the degree of atrophy and stable.  Mild confluent white matter hypoattenuation is evident.  The paranasal sinuses and mastoid air cells are clear.  IMPRESSION:  1.  Resolving the high left frontal lobe hemorrhage. 2.  No new hemorrhage. 3.  Advanced atrophy and white matter disease likely reflects the sequelae of chronic microvascular ischemia.  Original Report Authenticated By: Jamesetta Orleans. MATTERN, M.D.   Ct Head Wo Contrast  04/05/2012  *RADIOLOGY REPORT*  Clinical Data: Nausea, vomiting, fall.  CT HEAD WITHOUT CONTRAST  Technique:  Contiguous axial images were obtained from the base of the skull through the vertex without contrast.  Comparison: None.  Findings: There is a small amount of blood seen in the left frontal region.  This appears to be within sulci compatible with subarachnoid blood.  There is severe atrophy.  Moderate chronic small vessel disease changes.  Ventriculomegaly noted which I suspect is related to ex vacuo dilatation.  No mass effect or midline shift.  No intraparenchymal hemorrhage.  No acute bony abnormality.  IMPRESSION: Small amount of blood in the left frontal region which appears to be extra-axial, likely subarachnoid.  Critical Value/emergent results were called by telephone at the time of interpretation on 04/05/2012  at  2:10 p.m.  to  Dr. Estell Harpin, who verbally acknowledged these results.  Original Report Authenticated By: Cyndie Chime, M.D.    Assessment / Plan:

## 2012-04-20 ENCOUNTER — Telehealth: Payer: Self-pay

## 2012-04-20 NOTE — Telephone Encounter (Signed)
Received call from [facility], monitor revealed 3.4 sec pause.Daughter was called she stated patient had another syncopal episode this past Monday while sitting in car.States father has no appetite,no energy.States he is resting in bed at present.Appointment scheduled with Norma Fredrickson NP tomorrow 04/21/12.

## 2012-04-21 ENCOUNTER — Ambulatory Visit (INDEPENDENT_AMBULATORY_CARE_PROVIDER_SITE_OTHER): Payer: Medicare Other | Admitting: Nurse Practitioner

## 2012-04-21 ENCOUNTER — Encounter: Payer: Self-pay | Admitting: Nurse Practitioner

## 2012-04-21 VITALS — BP 108/78 | HR 71 | Ht 69.0 in | Wt 170.0 lb

## 2012-04-21 DIAGNOSIS — I609 Nontraumatic subarachnoid hemorrhage, unspecified: Secondary | ICD-10-CM

## 2012-04-21 DIAGNOSIS — R55 Syncope and collapse: Secondary | ICD-10-CM

## 2012-04-21 NOTE — Progress Notes (Signed)
Benjamin Noble Date of Birth: [birth year] Medical Record #[id number]  History of Present Illness: [name] is seen back today for a work in visit. He is seen for Dr. Patty Sermons. He has multiple medical issues which include known CAD with remote CABG and redo CABG back in 1992. He has chronic angina that is managed medically. He also has HTN, LV dysfunction with an EF of 35% per recent echo, HTN, DM and GERD. He had been on aspirin and coumadin for many years. Most recently, he had a syncopal spell which resulted in a subarachnoid hemorrhage. Coumadin and aspirin were stopped. I saw him 2 days ago. His son reported another episode that sounded like syncope that had occurred on Monday. I placed an event monitor.  He comes back today. He is here with his son and daughter. His monitor is showing pauses of 3 to 4 seconds. There was an episode yesterday at 12:30pm. He was symptomatic and says he was standing in the bathroom and then fell against the mirror. No injury sustained.   Current Outpatient Prescriptions on File Prior to Visit  Medication Sig Dispense Refill  . docusate sodium (COLACE) 100 MG capsule Take 100 mg by mouth daily.       . ferrous sulfate 325 (65 FE) MG tablet Take 325 mg by mouth daily with breakfast.       . furosemide (LASIX) 20 MG tablet Take 1 tablet (20 mg total) by mouth daily.  30 tablet  1  . isosorbide mononitrate (IMDUR) 30 MG 24 hr tablet Take 1 tablet (30 mg total) by mouth 2 (two) times daily.  60 tablet  1  . losartan (COZAAR) 100 MG tablet Take 100 mg by mouth every morning.      . metFORMIN (GLUCOPHAGE) 500 MG tablet Take 500-1,000 mg by mouth 2 (two) times daily. 2 tablets in the morning and 1 tablet in the evening      . nitroGLYCERIN (NITROSTAT) 0.4 MG SL tablet Place 0.4 mg under the tongue every 5 (five) minutes as needed. For chest pain.      . pantoprazole (PROTONIX) 40 MG tablet Take 40 mg by mouth daily.          Allergies  Allergen  Reactions  . Penicillins Anaphylaxis and Swelling  . Lipitor (Atorvastatin Calcium) Other (See Comments)    Gi issues  . Macrodantin Other (See Comments)    Unknown   . Morphine And Related Other (See Comments)    "doesn't feel right"  . Ranolazine Er Other (See Comments)    constipation  . Statins Other (See Comments)    Leg weakness    Past Medical History  Diagnosis Date  . Ischemic heart disease     original CABG in 1984 with redo CABG in 1992; managed medically  . HTN (hypertension)   . Angina pectoris   . Hypercholesterolemia   . CHF (congestive heart failure)     EF is 35 to 40% per echo April 2013  . Arthritis   . Urinary incontinence   . Myocardial infarction   . Dysrhythmia     irregular  heart beats per patient  . Asthma   . Shortness of breath   . Diabetes mellitus     type 2  . GERD (gastroesophageal reflux disease)   . Neuromuscular disorder     periferal neruropathy  . Depression   . Syncope   . Subarachnoid hemorrhage April 2013    from fall/coumadin & aspirin  stopped    Past Surgical History  Procedure Date  . Cardiac catheterization 03/18/1998  . Coronary artery bypass graft 9811,9147    first at [facility]  . Circumcision 02/05/08  . Eye surgery   . Cardiac stents     History  Smoking status  . Never Smoker   Smokeless tobacco  . Never Used    History  Alcohol Use No    Family History  Problem Relation Age of Onset  . Heart attack Father     Review of Systems: The review of systems is positive for recurrent syncope.  All other systems were reviewed and are negative.  Physical Exam: BP 108/78  Pulse 71  Ht 5\' 9"  (1.753 m)  Wt 170 lb (77.111 kg)  BMI 25.10 kg/m2  SpO2 96% Patient is very pleasant and in no acute distress. He is chronically ill. He is in a wheelchair. Skin is warm and dry. Color is normal.  HEENT is unremarkable. Normocephalic/atraumatic. PERRL. Sclera are nonicteric. Neck is supple. No masses. No JVD.  Lungs are clear. Cardiac exam shows a regular rate and rhythm. Abdomen is soft. Extremities are without edema. Gait is not tested. No gross neurologic deficits noted.   LABORATORY DATA:   Assessment / Plan:

## 2012-04-21 NOTE — Assessment & Plan Note (Signed)
We had already planned for repeat CT of the head in about 2 weeks. For now, he remains off of his aspirin and coumadin therapy.

## 2012-04-21 NOTE — Assessment & Plan Note (Signed)
He is having recurrent syncope. Event monitor is showing pauses. I have discussed his case with Dr. Johney Frame by phone today. Mr. Torian is on Lopressor. We are going to stop it altogether. He will continue to wear the event monitor. If the pauses persist, then PTVP is in order. Mr. Habeeb is not really wanting to proceed with a pacemaker but is agreeable to this plan. Unfortunately, his family reports that he has had a history of fast heart beating. If that recurs, then a pacemaker would be necessary for adequate rate control. If he begins to have recurrent angina, then we could add Norvasc. I do not see where he has tried that in the past. Patient and his family are agreeable to this plan and will call if any problems develop in the interim.

## 2012-04-21 NOTE — Patient Instructions (Signed)
Stop the Lopressor (metoprolol)  Stay on the other medicines  Keep wearing the monitor.  Call us for any worsening of chest pain  Call the [facility] office at [phone] if you have any questions, problems or concerns.

## 2012-04-25 ENCOUNTER — Telehealth: Payer: Self-pay | Admitting: *Deleted

## 2012-04-25 MED ORDER — METOPROLOL TARTRATE 25 MG PO TABS: 25.0000 mg | ORAL_TABLET | Freq: Two times a day (BID) | ORAL | Status: DC

## 2012-04-25 NOTE — Telephone Encounter (Signed)
Received some strips from monitor and heart rate as high as 171, did come down.  Does continue to have some heart rates over 100.  Dr. Patty Sermons reviewed strips.  Will start Metoprolol tartrate 25 mg twice a day.  Advised daughter-in-law.  Patient son at work

## 2012-04-26 ENCOUNTER — Encounter (HOSPITAL_COMMUNITY): Payer: Self-pay | Admitting: *Deleted

## 2012-04-26 ENCOUNTER — Inpatient Hospital Stay (HOSPITAL_COMMUNITY)
Admission: EM | Admit: 2012-04-26 | Discharge: 2012-05-02 | DRG: 242 | Disposition: A | Discharge status: Skilled Nursing Facility | Payer: Medicare Other | Attending: Internal Medicine | Admitting: Internal Medicine

## 2012-04-26 ENCOUNTER — Emergency Department (HOSPITAL_COMMUNITY): Payer: Medicare Other

## 2012-04-26 DIAGNOSIS — I499 Cardiac arrhythmia, unspecified: Secondary | ICD-10-CM

## 2012-04-26 DIAGNOSIS — I609 Nontraumatic subarachnoid hemorrhage, unspecified: Secondary | ICD-10-CM

## 2012-04-26 DIAGNOSIS — I4510 Unspecified right bundle-branch block: Secondary | ICD-10-CM | POA: Diagnosis present

## 2012-04-26 DIAGNOSIS — I2589 Other forms of chronic ischemic heart disease: Secondary | ICD-10-CM | POA: Diagnosis present

## 2012-04-26 DIAGNOSIS — K219 Gastro-esophageal reflux disease without esophagitis: Secondary | ICD-10-CM | POA: Diagnosis present

## 2012-04-26 DIAGNOSIS — E119 Type 2 diabetes mellitus without complications: Secondary | ICD-10-CM | POA: Diagnosis present

## 2012-04-26 DIAGNOSIS — M129 Arthropathy, unspecified: Secondary | ICD-10-CM | POA: Diagnosis present

## 2012-04-26 DIAGNOSIS — E7800 Pure hypercholesterolemia, unspecified: Secondary | ICD-10-CM | POA: Diagnosis present

## 2012-04-26 DIAGNOSIS — I252 Old myocardial infarction: Secondary | ICD-10-CM

## 2012-04-26 DIAGNOSIS — Z9861 Coronary angioplasty status: Secondary | ICD-10-CM

## 2012-04-26 DIAGNOSIS — R000 Tachycardia, unspecified: Secondary | ICD-10-CM

## 2012-04-26 DIAGNOSIS — I509 Heart failure, unspecified: Secondary | ICD-10-CM | POA: Diagnosis present

## 2012-04-26 DIAGNOSIS — I472 Ventricular tachycardia: Secondary | ICD-10-CM

## 2012-04-26 DIAGNOSIS — R627 Adult failure to thrive: Secondary | ICD-10-CM | POA: Diagnosis present

## 2012-04-26 DIAGNOSIS — I495 Sick sinus syndrome: Principal | ICD-10-CM

## 2012-04-26 DIAGNOSIS — Z951 Presence of aortocoronary bypass graft: Secondary | ICD-10-CM

## 2012-04-26 DIAGNOSIS — G609 Hereditary and idiopathic neuropathy, unspecified: Secondary | ICD-10-CM | POA: Diagnosis present

## 2012-04-26 DIAGNOSIS — Z950 Presence of cardiac pacemaker: Secondary | ICD-10-CM

## 2012-04-26 DIAGNOSIS — I10 Essential (primary) hypertension: Secondary | ICD-10-CM | POA: Diagnosis present

## 2012-04-26 DIAGNOSIS — I2510 Atherosclerotic heart disease of native coronary artery without angina pectoris: Secondary | ICD-10-CM | POA: Diagnosis present

## 2012-04-26 DIAGNOSIS — I214 Non-ST elevation (NSTEMI) myocardial infarction: Secondary | ICD-10-CM

## 2012-04-26 DIAGNOSIS — R55 Syncope and collapse: Secondary | ICD-10-CM

## 2012-04-26 DIAGNOSIS — Z79899 Other long term (current) drug therapy: Secondary | ICD-10-CM

## 2012-04-26 LAB — POCT I-STAT, CHEM 8
BUN: 34 mg/dL — ABNORMAL HIGH (ref 6–23)
Calcium, Ion: 1.15 mmol/L (ref 1.12–1.32)
Creatinine, Ser: 1.4 mg/dL — ABNORMAL HIGH (ref 0.50–1.35)
Glucose, Bld: 255 mg/dL — ABNORMAL HIGH (ref 70–99)
HCT: 41 % (ref 39.0–52.0)
Hemoglobin: 13.9 g/dL (ref 13.0–17.0)
TCO2: 25 mmol/L (ref 0–100)

## 2012-04-26 LAB — COMPREHENSIVE METABOLIC PANEL
Albumin: 3.5 g/dL (ref 3.5–5.2)
BUN: 35 mg/dL — ABNORMAL HIGH (ref 6–23)
Calcium: 9.1 mg/dL (ref 8.4–10.5)
Chloride: 100 mEq/L (ref 96–112)
Creatinine, Ser: 1.33 mg/dL (ref 0.50–1.35)
GFR calc non Af Amer: 46 mL/min — ABNORMAL LOW (ref >90)
Total Bilirubin: 0.4 mg/dL (ref 0.3–1.2)

## 2012-04-26 LAB — CARDIAC PANEL(CRET KIN+CKTOT+MB+TROPI)
CK, MB: 28.7 ng/mL (ref 0.3–4.0)
CK, MB: 94.5 ng/mL (ref 0.3–4.0)
Total CK: 294 U/L — ABNORMAL HIGH (ref 7–232)
Troponin I: 6.37 ng/mL (ref <0.30)

## 2012-04-26 LAB — MRSA PCR SCREENING: MRSA by PCR: NEGATIVE

## 2012-04-26 LAB — CBC
HCT: 39.3 % (ref 39.0–52.0)
MCH: 31.5 pg (ref 26.0–34.0)
MCV: 90.3 fL (ref 78.0–100.0)
RDW: 12.8 % (ref 11.5–15.5)
WBC: 7.2 10*3/uL (ref 4.0–10.5)

## 2012-04-26 LAB — POCT I-STAT TROPONIN I: Troponin i, poc: 0.04 ng/mL (ref 0.00–0.08)

## 2012-04-26 LAB — GLUCOSE, CAPILLARY: Glucose-Capillary: 329 mg/dL — ABNORMAL HIGH (ref 70–99)

## 2012-04-26 MED ORDER — FERROUS SULFATE 325 (65 FE) MG PO TABS
325.0000 mg | ORAL_TABLET | ORAL | Status: DC
  Administered 2012-04-26 – 2012-05-02 (×4): 325 mg via ORAL
  Filled 2012-04-26 (×4): qty 1

## 2012-04-26 MED ORDER — SODIUM CHLORIDE 0.9 % IJ SOLN
3.0000 mL | Freq: Two times a day (BID) | INTRAMUSCULAR | Status: DC
  Administered 2012-04-26 – 2012-05-02 (×9): 3 mL via INTRAVENOUS

## 2012-04-26 MED ORDER — ADENOSINE 6 MG/2ML IV SOLN
6.0000 mg | Freq: Once | INTRAVENOUS | Status: AC
  Administered 2012-04-26: 6 mg via INTRAVENOUS

## 2012-04-26 MED ORDER — ADENOSINE 6 MG/2ML IV SOLN
6.0000 mg | Freq: Once | INTRAVENOUS | Status: DC
  Filled 2012-04-26: qty 2

## 2012-04-26 MED ORDER — ASPIRIN EC 81 MG PO TBEC
81.0000 mg | DELAYED_RELEASE_TABLET | Freq: Once | ORAL | Status: DC
  Filled 2012-04-26: qty 1

## 2012-04-26 MED ORDER — AMIODARONE HCL IN DEXTROSE 360-4.14 MG/200ML-% IV SOLN
60.0000 mg/h | INTRAVENOUS | Status: DC
  Administered 2012-04-26 – 2012-04-27 (×4): 60 mg/h via INTRAVENOUS
  Filled 2012-04-26 (×11): qty 200

## 2012-04-26 MED ORDER — DILTIAZEM HCL 25 MG/5ML IV SOLN
INTRAVENOUS | Status: AC
  Filled 2012-04-26: qty 5

## 2012-04-26 MED ORDER — METOPROLOL TARTRATE 1 MG/ML IV SOLN
5.0000 mg | Freq: Once | INTRAVENOUS | Status: AC
  Administered 2012-04-26: 2.5 mg via INTRAVENOUS
  Filled 2012-04-26: qty 5

## 2012-04-26 MED ORDER — AMIODARONE IV BOLUS ONLY 150 MG/100ML
150.0000 mg | Freq: Once | INTRAVENOUS | Status: AC
  Administered 2012-04-26: 150 mg via INTRAVENOUS

## 2012-04-26 MED ORDER — ONDANSETRON HCL 4 MG/2ML IJ SOLN
4.0000 mg | Freq: Four times a day (QID) | INTRAMUSCULAR | Status: DC | PRN
  Administered 2012-04-27 – 2012-04-28 (×3): 4 mg via INTRAVENOUS
  Filled 2012-04-26 (×3): qty 2

## 2012-04-26 MED ORDER — METOPROLOL TARTRATE 1 MG/ML IV SOLN
2.5000 mg | Freq: Three times a day (TID) | INTRAVENOUS | Status: DC
  Filled 2012-04-26: qty 5

## 2012-04-26 MED ORDER — AMIODARONE HCL IN DEXTROSE 360-4.14 MG/200ML-% IV SOLN
INTRAVENOUS | Status: AC
  Administered 2012-04-26: 150 mg via INTRAVENOUS
  Filled 2012-04-26: qty 200

## 2012-04-26 MED ORDER — SODIUM CHLORIDE 0.9 % IV SOLN: 250.0000 mL | INTRAVENOUS | Status: DC | PRN

## 2012-04-26 MED ORDER — ADENOSINE 6 MG/2ML IV SOLN
INTRAVENOUS | Status: AC
  Filled 2012-04-26: qty 6

## 2012-04-26 MED ORDER — FUROSEMIDE 20 MG PO TABS
20.0000 mg | ORAL_TABLET | Freq: Every day | ORAL | Status: DC
  Administered 2012-04-26 – 2012-05-02 (×7): 20 mg via ORAL
  Filled 2012-04-26 (×8): qty 1

## 2012-04-26 MED ORDER — PANTOPRAZOLE SODIUM 40 MG PO TBEC
40.0000 mg | DELAYED_RELEASE_TABLET | Freq: Every day | ORAL | Status: DC
  Administered 2012-04-26 – 2012-05-02 (×7): 40 mg via ORAL
  Filled 2012-04-26 (×7): qty 1

## 2012-04-26 MED ORDER — ZOLPIDEM TARTRATE 5 MG PO TABS: 5.0000 mg | ORAL_TABLET | Freq: Every evening | ORAL | Status: DC | PRN

## 2012-04-26 MED ORDER — ISOSORBIDE MONONITRATE ER 30 MG PO TB24
30.0000 mg | ORAL_TABLET | Freq: Two times a day (BID) | ORAL | Status: DC
  Administered 2012-04-26 – 2012-05-02 (×13): 30 mg via ORAL
  Filled 2012-04-26 (×15): qty 1

## 2012-04-26 MED ORDER — NITROGLYCERIN 0.4 MG SL SUBL: 0.4000 mg | SUBLINGUAL_TABLET | SUBLINGUAL | Status: DC | PRN

## 2012-04-26 MED ORDER — DEXTROSE 5 % IV SOLN
5.0000 mg/h | INTRAVENOUS | Status: DC
  Administered 2012-04-26: 5 mg/h via INTRAVENOUS

## 2012-04-26 MED ORDER — AMIODARONE HCL IN DEXTROSE 360-4.14 MG/200ML-% IV SOLN
INTRAVENOUS | Status: AC
  Administered 2012-04-26: 60 mg/h via INTRAVENOUS
  Filled 2012-04-26: qty 200

## 2012-04-26 MED ORDER — ALPRAZOLAM 0.25 MG PO TABS
0.2500 mg | ORAL_TABLET | Freq: Two times a day (BID) | ORAL | Status: DC | PRN
  Administered 2012-04-29 – 2012-05-02 (×2): 0.25 mg via ORAL
  Filled 2012-04-26 (×2): qty 1

## 2012-04-26 MED ORDER — ASPIRIN 81 MG PO CHEW
324.0000 mg | CHEWABLE_TABLET | Freq: Once | ORAL | Status: AC
  Administered 2012-04-26: 324 mg via ORAL
  Filled 2012-04-26: qty 4

## 2012-04-26 MED ORDER — INSULIN ASPART 100 UNIT/ML ~~LOC~~ SOLN
0.0000 [IU] | Freq: Three times a day (TID) | SUBCUTANEOUS | Status: DC
  Administered 2012-04-27 – 2012-04-28 (×3): 5 [IU] via SUBCUTANEOUS
  Administered 2012-04-28: 3 [IU] via SUBCUTANEOUS
  Administered 2012-04-28: 5 [IU] via SUBCUTANEOUS
  Administered 2012-04-29: 3 [IU] via SUBCUTANEOUS
  Administered 2012-04-29: 5 [IU] via SUBCUTANEOUS
  Administered 2012-04-29 – 2012-04-30 (×4): 3 [IU] via SUBCUTANEOUS

## 2012-04-26 MED ORDER — ACETAMINOPHEN 325 MG PO TABS
650.0000 mg | ORAL_TABLET | ORAL | Status: DC | PRN
  Administered 2012-04-26: 650 mg via ORAL
  Filled 2012-04-26: qty 2

## 2012-04-26 MED ORDER — ENOXAPARIN SODIUM 40 MG/0.4ML ~~LOC~~ SOLN
40.0000 mg | SUBCUTANEOUS | Status: DC
  Administered 2012-04-26: 40 mg via SUBCUTANEOUS
  Filled 2012-04-26 (×2): qty 0.4

## 2012-04-26 MED ORDER — DILTIAZEM HCL 50 MG/10ML IV SOLN
10.0000 mg | Freq: Once | INTRAVENOUS | Status: AC
  Administered 2012-04-26: 10 mg via INTRAVENOUS
  Filled 2012-04-26: qty 2

## 2012-04-26 MED ORDER — DOCUSATE SODIUM 100 MG PO CAPS
100.0000 mg | ORAL_CAPSULE | Freq: Every day | ORAL | Status: DC
  Administered 2012-04-26 – 2012-05-02 (×7): 100 mg via ORAL
  Filled 2012-04-26 (×7): qty 1

## 2012-04-26 MED ORDER — AMIODARONE HCL IN DEXTROSE 360-4.14 MG/200ML-% IV SOLN
30.0000 mg/h | INTRAVENOUS | Status: DC
  Administered 2012-04-26: 150 mg via INTRAVENOUS

## 2012-04-26 MED ORDER — SODIUM CHLORIDE 0.9 % IJ SOLN: 3.0000 mL | INTRAMUSCULAR | Status: DC | PRN

## 2012-04-26 MED ORDER — AMIODARONE HCL IN DEXTROSE 360-4.14 MG/200ML-% IV SOLN
INTRAVENOUS | Status: AC
  Filled 2012-04-26: qty 200

## 2012-04-26 NOTE — Progress Notes (Signed)
Reported to Dr. Gala Romney that pt was having pauses and HR falling to 20 several times.  Orders received to dc metoprolol.

## 2012-04-26 NOTE — ED Provider Notes (Signed)
History     CSN: [id number]  Arrival date & time 04/26/12  7829   First MD Initiated Contact with Patient 04/26/12 0542      Chief Complaint  Patient presents with  . Chest Pain  . Headache    (Consider location/radiation/quality/duration/timing/severity/associated sxs/prior treatment) HPI History provided by patient and son bedside. Has history of A. fib, coronary artery disease and arrhythmia followed by our cardiologist Dr. Patty Sermons. Has history of syncope and currently wearing a Holter monitor. Patient previously receiving beta blocker that overcorrected his fast heart rate it has recently been taken off of it. He relays that his physician has considered pacemaker. Patient woke up this morning with palpitations and some shortness of breath. He also some mild headache. No weakness or numbness. No chest pain. Patient relates she is having fast heart rate at this time. Past Medical History  Diagnosis Date  . Ischemic heart disease     original CABG in 1984 with redo CABG in 1992; managed medically  . HTN (hypertension)   . Angina pectoris   . Hypercholesterolemia   . CHF (congestive heart failure)     EF is 35 to 40% per echo April 2013  . Arthritis   . Urinary incontinence   . Myocardial infarction   . Dysrhythmia     irregular  heart beats per patient  . Asthma   . Shortness of breath   . Diabetes mellitus     type 2  . GERD (gastroesophageal reflux disease)   . Neuromuscular disorder     periferal neruropathy  . Depression   . Syncope   . Subarachnoid hemorrhage April 2013    from fall/coumadin & aspirin stopped    Past Surgical History  Procedure Date  . Cardiac catheterization 03/18/1998  . Coronary artery bypass graft 5621,3086    first at [facility]  . Circumcision 02/05/08  . Eye surgery   . Cardiac stents     Family History  Problem Relation Age of Onset  . Heart attack Father     History  Substance Use Topics  . Smoking status: Never Smoker     . Smokeless tobacco: Never Used  . Alcohol Use: No      Review of Systems  Constitutional: Negative for fever and chills.  HENT: Negative for neck pain and neck stiffness.   Eyes: Negative for pain.  Respiratory: Negative for wheezing.   Cardiovascular: Positive for palpitations. Negative for chest pain and leg swelling.  Gastrointestinal: Negative for abdominal pain.  Genitourinary: Negative for dysuria.  Musculoskeletal: Negative for back pain.  Skin: Negative for rash.  Neurological: Negative for headaches.  All other systems reviewed and are negative.    Allergies  Penicillins; Lipitor; Macrodantin; Morphine and related; Ranolazine er; and Statins  Home Medications   Current Outpatient Rx  Name Route Sig Dispense Refill  . ASPIRIN EC 81 MG PO TBEC Oral Take 81 mg by mouth once. Chest pain    . DOCUSATE SODIUM 100 MG PO CAPS Oral Take 100 mg by mouth daily.     Marland Kitchen FERROUS SULFATE 325 (65 FE) MG PO TABS Oral Take 325 mg by mouth every other day.     . FUROSEMIDE 20 MG PO TABS Oral Take 1 tablet (20 mg total) by mouth daily. 30 tablet 1  . ISOSORBIDE MONONITRATE ER 30 MG PO TB24 Oral Take 1 tablet (30 mg total) by mouth 2 (two) times daily. 60 tablet 1  . NITROGLYCERIN 0.4 MG SL  SUBL Sublingual Place 0.4 mg under the tongue every 5 (five) minutes as needed. For chest pain.    Marland Kitchen PANTOPRAZOLE SODIUM 40 MG PO TBEC Oral Take 40 mg by mouth daily.      Marland Kitchen METOPROLOL TARTRATE 25 MG PO TABS Oral Take 1 tablet (25 mg total) by mouth 2 (two) times daily. 60 tablet 11    BP 149/105  Pulse 156  Temp(Src) 98.5 F (36.9 C) (Oral)  Resp 19  SpO2 100%  Physical Exam  Constitutional: He is oriented to person, place, and time. He appears well-developed and well-nourished.  HENT:  Head: Normocephalic and atraumatic.  Eyes: Conjunctivae and EOM are normal. Pupils are equal, round, and reactive to light.  Neck: Trachea normal. Neck supple. No thyromegaly present.  Cardiovascular: S1  normal, S2 normal and normal pulses.     No systolic murmur is present   No diastolic murmur is present  Pulses:      Radial pulses are 2+ on the right side, and 2+ on the left side.       Tachycardia  Pulmonary/Chest: Effort normal and breath sounds normal. He has no wheezes. He has no rhonchi. He has no rales. He exhibits no tenderness.  Abdominal: Soft. Normal appearance and bowel sounds are normal. There is no tenderness. There is no CVA tenderness and negative Murphy's sign.  Musculoskeletal:       BLE:s Calves nontender, no cords or erythema, negative Homans sign  Neurological: He is alert and oriented to person, place, and time. He has normal strength. No cranial nerve deficit or sensory deficit. GCS eye subscore is 4. GCS verbal subscore is 5. GCS motor subscore is 6.  Skin: Skin is warm and dry. No rash noted. He is not diaphoretic.  Psychiatric: His speech is normal.       Cooperative and appropriate    ED Course  Procedures (including critical care time)  Results for orders placed during the hospital encounter of 04/26/12  PROTIME-INR      Component Value Range   Prothrombin Time 12.9  11.6 - 15.2 (seconds)   INR 0.95  0.00 - 1.49   CBC      Component Value Range   WBC 7.2  4.0 - 10.5 (K/uL)   RBC 4.35  4.22 - 5.81 (MIL/uL)   Hemoglobin 13.7  13.0 - 17.0 (g/dL)   HCT 40.9  81.1 - 91.4 (%)   MCV 90.3  78.0 - 100.0 (fL)   MCH 31.5  26.0 - 34.0 (pg)   MCHC 34.9  30.0 - 36.0 (g/dL)   RDW 78.2  95.6 - 21.3 (%)   Platelets 230  150 - 400 (K/uL)  COMPREHENSIVE METABOLIC PANEL      Component Value Range   Sodium 137  135 - 145 (mEq/L)   Potassium 4.4  3.5 - 5.1 (mEq/L)   Chloride 100  96 - 112 (mEq/L)   CO2 26  19 - 32 (mEq/L)   Glucose, Bld 253 (*) 70 - 99 (mg/dL)   BUN 35 (*) 6 - 23 (mg/dL)   Creatinine, Ser 0.86  0.50 - 1.35 (mg/dL)   Calcium 9.1  8.4 - 57.8 (mg/dL)   Total Protein 7.0  6.0 - 8.3 (g/dL)   Albumin 3.5  3.5 - 5.2 (g/dL)   AST 13  0 - 37 (U/L)    ALT 10  0 - 53 (U/L)   Alkaline Phosphatase 107  39 - 117 (U/L)   Total Bilirubin 0.4  0.3 - 1.2 (mg/dL)   GFR calc non Af Amer 46 (*) >90 (mL/min)   GFR calc Af Amer 54 (*) >90 (mL/min)  POCT I-STAT, CHEM 8      Component Value Range   Sodium 138  135 - 145 (mEq/L)   Potassium 4.2  3.5 - 5.1 (mEq/L)   Chloride 104  96 - 112 (mEq/L)   BUN 34 (*) 6 - 23 (mg/dL)   Creatinine, Ser 1.61 (*) 0.50 - 1.35 (mg/dL)   Glucose, Bld 096 (*) 70 - 99 (mg/dL)   Calcium, Ion 0.45  4.09 - 1.32 (mmol/L)   TCO2 25  0 - 100 (mmol/L)   Hemoglobin 13.9  13.0 - 17.0 (g/dL)   HCT 81.1  91.4 - 78.2 (%)  POCT I-STAT TROPONIN I      Component Value Range   Troponin i, poc 0.04  0.00 - 0.08 (ng/mL)   Comment 3             Dg Chest Portable 1 View  04/26/2012  *RADIOLOGY REPORT*  Clinical Data: Chest pain.  PORTABLE CHEST - 1 VIEW  Comparison: Chest radiograph performed 04/05/2012  Findings: The lungs are well-aerated.  Mild bibasilar airspace opacities likely reflect atelectasis or scarring.  There is no evidence of pleural effusion or pneumothorax.  The cardiomediastinal silhouette is borderline normal in size; the patient is status post median sternotomy.  There are fractures of all of the visualized sternal wires.  No acute osseous abnormalities are seen.  IMPRESSION: Mild bibasilar airspace opacities may reflect atelectasis or scarring; no definite superimposed airspace consolidation seen.  Original Report Authenticated By: Tonia Ghent, M.D.    Date: 04/26/2012  Rate: 159  Rhythm: wide complex tachycardia  QRS Axis: left  Intervals: QT prolonged  ST/T Wave abnormalities: nonspecific ST/T changes  Conduction Disutrbances:none  Narrative Interpretation: White complex tachycardia with ST depressions compared to previous has old right bundle branch block similar in appearance to complexes on current EKG  Old EKG Reviewed: changes noted  ECG reviewed. PT stable with minimal complaints, no hypoxia. BP 162/  104, noted h/o afib. amiodarone 150mg  IV without change. Cardiology consult requested.   5:58 AM consult cardiology, Dr. Mayford Knife recommends calling back to discuss with oncoming cardiologist  Diltiazem IVP without sig changes - he did develop some pauses captured on monitor that demonstrate A. fib that converts back to rapid rate. Cont diltiazem drip  6:30 AM discussed with Rhonda from [facility], will eval in ED for admit. Patient condition remains unchanged. No hypotension or unstable arrhythmia.  CRITICAL CARE Performed by: Sunnie Nielsen   Total critical care time:65  Critical care time was exclusive of separately billable procedures and treating other patients.  Critical care was necessary to treat or prevent imminent or life-threatening deterioration.  Critical care was time spent personally by me on the following activities: development of treatment plan with patient and/or surrogate as well as nursing, discussions with consultants, evaluation of patient's response to treatment, examination of patient, obtaining history from patient or surrogate, ordering and performing treatments and interventions, ordering and review of laboratory studies, ordering and review of radiographic studies, pulse oximetry and re-evaluation of patient's condition. IV amiodarone. IV Cardizem drip. CAR consult for ED eval and admission. Serial evaluations and maintained on monitor with pacer pads in place.   Evaluated in ED by [place], HR 130s with pauses, plan admit.  MDM   Wide-complex Arrhythmia with suspected underlying A. Fib. Normotensive, no chest pain or shortness of breath  in the ED. CAR admit        Sunnie Nielsen, MD 04/26/12 [phone]

## 2012-04-26 NOTE — ED Notes (Addendum)
Here with other, here by POV, mentions CP, straight back to exam room for EKG, pt "having trouble thinking clearly", has HA, mentions L arm pain earlier, slow to respond, soft spoken, NAD, calm, passively interactive, speech clear, skin W&D, resps e/u, denies CP at this time, "has a HA now". Pt of Dr. Patty Sermons (PCP/cards), wearing a heart monitor, mentions recent ED visit for similar. Per old chart: recent SAH, syncope, CABG. H/o coumadin use. Generally weak, needed 2 person assist with transfer from w/c to stretcher.

## 2012-04-26 NOTE — Consult Note (Addendum)
CARDIOLOGY CONSULT NOTE   Patient ID: Cheney Ewart MRN: [id number] DOB/AGE: 05-31-1922 78 y.o.  Admit date: 04/26/2012  Primary Physician   Cassell Clement, MD, MD Primary Cardiologist   TB Reason for Consultation    WCT  JYN:WGNFA Chancelor Hardrick is a 78 y.o. male with a history of CAD s/p CABG in 1984 with re-do 1992.He also has DM2, neuropathy, RBBB and d/o SAH from a fall.  He is very limited at baseline and gets around with walker and motorized scooter.     He had syncope and an event monitor was placed. He had symptomatic pauses 3-4 seconds and his BB was d/c'd on 04/19/2012.  2 Days ago, the patient had chest pain, no Rx, 5/10, it resolved but he had chest soreness like his angina. Last pm, he went to bed but did not sleep. He c/o HA and sitter took his BP. It was up and he took a NTG. His left arm hurt as well. His HR during this was 159, SBP > 170. His son was called and brought him to the ER. In the ER, he is A&O with a HR in the 150s. WCT. Cardizem low-dose no help. Amio bolus helped slow rate but when he briefly converted, he was bradycardic. Currently, his HR is in the 130s, BP is 120/71 and pt is asymptomatic at rest.  Received 2.5 mg lopressor with conversion to Sinus brady/junctional rhythm with HRs in 20s.   Past Medical History  Diagnosis Date  . Ischemic heart disease     original CABG in 1984 with redo CABG in 1992; managed medically  . HTN (hypertension)   . Angina pectoris   . Hypercholesterolemia   . CHF (congestive heart failure)     EF is 35 to 40% per echo April 2013  . Arthritis   . Urinary incontinence   . Myocardial infarction   . Dysrhythmia     irregular  heart beats per patient  . Asthma   . Shortness of breath   . Diabetes mellitus     type 2  . GERD (gastroesophageal reflux disease)   . Neuromuscular disorder     periferal neruropathy  . Depression   . Syncope   . Subarachnoid hemorrhage April 2013    from fall/coumadin &  aspirin stopped     Past Surgical History  Procedure Date  . Cardiac catheterization 03/18/1998  . Coronary artery bypass graft [id number]    first at [facility]  . Circumcision 02/05/08  . Eye surgery   . Cardiac stents     Allergies  Allergen Reactions  . Penicillins Anaphylaxis and Swelling  . Lipitor (Atorvastatin Calcium) Other (See Comments)    Gi issues  . Macrodantin Other (See Comments)    Unknown   . Morphine And Related Other (See Comments)    "doesn't feel right"  . Ranolazine Er Other (See Comments)    constipation  . Statins Other (See Comments)    Leg weakness    I have reviewed the patient's current medications    . amiodarone (NEXTERONE PREMIX) 360 mg/200 mL dextrose      . amiodarone  150 mg Intravenous Once  . aspirin  324 mg Oral Once  . diltiazem      . diltiazem  10 mg Intravenous Once      . diltiazem (CARDIZEM) infusion     DISCONTD: nitroGLYCERIN No current facility-administered medications on file prior to encounter.   Current Outpatient Prescriptions on File  Prior to Encounter  Medication Sig Dispense Refill  . docusate sodium (COLACE) 100 MG capsule Take 100 mg by mouth daily.       . ferrous sulfate 325 (65 FE) MG tablet Take 325 mg by mouth every other day.       . furosemide (LASIX) 20 MG tablet Take 1 tablet (20 mg total) by mouth daily.  30 tablet  1  . isosorbide mononitrate (IMDUR) 30 MG 24 hr tablet Take 1 tablet (30 mg total) by mouth 2 (two) times daily.  60 tablet  1  . nitroGLYCERIN (NITROSTAT) 0.4 MG SL tablet Place 0.4 mg under the tongue every 5 (five) minutes as needed. For chest pain.      . pantoprazole (PROTONIX) 40 MG tablet Take 40 mg by mouth daily.        . metoprolol tartrate (LOPRESSOR) 25 MG tablet Take 1 tablet (25 mg total) by mouth 2 (two) times daily.  60 tablet  11     History   Social History  . Marital Status: Married    Spouse Name: N/A    Number of Children: N/A  . Years of Education: N/A    Occupational History  . Not on file.   Social History Main Topics  . Smoking status: Never Smoker   . Smokeless tobacco: Never Used  . Alcohol Use: No  . Drug Use: No  . Sexually Active: No   Other Topics Concern  . Not on file   Social History Narrative   The patient lives at home with his wife, who has alzheimer's.  They have a 24-hr sitter in the home, and close family contacts nearby.  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     Family History  Problem Relation Age of Onset  . Heart attack Father      ROS: His memory is poor. He is weak but able to get around [facility]. He has not had recent illnesses, fevers or chills. No melena. Full 14 point review of systems complete and found to be negative unless listed above.  Physical Exam: Blood pressure 116/81, pulse 143, temperature 98.5 F (36.9 C), temperature source Oral, resp. rate 21, SpO2 99.00%.  General:Frail elderly male in no acute distress Head: Eyes PERRLA, No xanthomas.   Normocephalic and atraumatic, oropharynx without edema or exudate. Dentition - poor Lungs: Bilateral basilar rales  Heart: HRRR but rapid S1 S2, no rub/gallop, no significant murmur. pulses are 2+ all 4 extrem.   Neck: No carotid bruit. No lymphadenopathy.  JVP 9 Abdomen: Bowel sounds present, abdomen soft and non-tender without masses or hernias noted. Msk:  No spine or cva tenderness. No weakness, no joint deformities or effusions. Extremities: No clubbing or cyanosis. No edema.  Neuro: Alert and oriented X 3. No focal deficits noted. Psych:  Good affect, responds appropriately Skin: No rashes or lesions noted.  Labs:   Lab Results  Component Value Date   WBC 7.2 04/26/2012   HGB 13.9 04/26/2012   HCT 41.0 04/26/2012   MCV 90.3 04/26/2012   PLT 230 04/26/2012    Basename 04/26/12 0524  INR 0.95    Lab 04/26/12 0548 04/26/12 0524  NA 138 --  K 4.2 --  CL 104 --  CO2 -- 26  BUN 34* --  CREATININE 1.40* --  CALCIUM -- 9.1   PROT -- 7.0  BILITOT -- 0.4  ALKPHOS -- 107  ALT -- 10  AST -- 13  GLUCOSE  255* --    Basename 04/26/12 0546  TROPIPOC 0.04   TSH  Date/Time Value Range Status  04/05/2012  8:52 PM 5.783* 0.350-4.500 (uIU/mL) Final    ECG:  Multiple ECGs reviewed WCT with rates 137-151. RBBB. Similar morphology to baseline ECG  Radiology:   Dg Chest Portable 1 View 04/26/2012  *RADIOLOGY REPORT*  Clinical Data: Chest pain.  PORTABLE CHEST - 1 VIEW  Comparison: Chest radiograph performed 04/05/2012  Findings: The lungs are well-aerated.  Mild bibasilar airspace opacities likely reflect atelectasis or scarring.  There is no evidence of pleural effusion or pneumothorax.  The cardiomediastinal silhouette is borderline normal in size; the patient is status post median sternotomy.  There are fractures of all of the visualized sternal wires.  No acute osseous abnormalities are seen.  IMPRESSION: Mild bibasilar airspace opacities may reflect atelectasis or scarring; no definite superimposed airspace consolidation seen.  Original Report Authenticated By: Tonia Ghent, M.D.    ASSESSMENT AND PLAN:   The patient was seen today by Dr Gala Romney, the patient evaluated and the data reviewed.    Signed: Theodore Demark 04/26/2012, 6:49 AM Co-Sign MD  Assessment:  1) Recurrent syncope 2) Wide-complex tachycardia - suspect SVT with underlying RBBB 3) Tachy-brady syndrome 4) Ischemic cardiomyopathy  EF 35-40% 5) CAD s/p CABG x 2 6) DM2 7) Limited functional capacity  Plan/Discussion:  I suspect this is SVT with underlying RBBB, however he does have substrate for VT. He is also having episodes of severe bradycardia (tachy-brady syndrome). Will likely need EP study and pacemaker. I have reviewed with Dr. Ladona Ridgel in EP. He agrees with above. Will put him in ICU on low-dose amio gtt with Zoll pad's in place. If bradycardia progresses will need TVP. EP to see.   Daniel Bensimhon,MD 8:24 AM  Pt given Lopressor 2.5  mg IV in the ER and briefly converted to SR/Junctional rhythm/junctional bradycardia but went back into WCT. He was given 6 mg of Adenosine and converted to severe sinus brady then junctional rhythm and then back into SR.   Salvador Bigbee 9:28 AM  Pt transferred to CCU. He has gone back into

## 2012-04-26 NOTE — ED Notes (Signed)
Amiodarone and Cardizem both d/c'd per Cardiology PA order.

## 2012-04-26 NOTE — ED Notes (Signed)
Attempted to call report. RN not available.

## 2012-04-26 NOTE — Care Management Note (Signed)
    Page 1 of 1   04/26/2012     11:53:39 AM   CARE MANAGEMENT NOTE 04/26/2012  Patient:  Benjamin Noble, Benjamin Noble   Account Number:  [id number]  Date Initiated:  04/26/2012  Documentation initiated by:  Junius Creamer  Subjective/Objective Assessment:   adm w arrthymia     Action/Plan:   lives w fam, has caregivers, pcp dr Lucienne Minks   Anticipated DC Date:  04/28/2012   Anticipated DC Plan:  HOME W HOME HEALTH SERVICES      DC Planning Services  CM consult      [facility] Choice  Resumption Of Svcs/PTA Provider   Choice offered to / List presented to:          [facility] arranged  HH-1 RN  HH-2 PT  HH-3 OT      [facility] agency  [facility]   Status of service:   Medicare Important Message given?   (If response is "NO", the following Medicare IM given date fields will be blank) Date Medicare IM given:   Date Additional Medicare IM given:    Discharge Disposition:    Per UR Regulation:  Reviewed for med. necessity/level of care/duration of stay  If discussed at Long Length of Stay Meetings, dates discussed:    Comments:  5/8 11:51 debbie Saanvi Hakala rn,bsn [phone]

## 2012-04-26 NOTE — ED Notes (Signed)
Dr Jones Broom is at the bedside.  Patient continues to have episodes of bradycardia.  Patient is on the zoll.  Family remains at bedside.  Cardiology updating patient and family at this time

## 2012-04-26 NOTE — ED Notes (Signed)
Family at bedside. EKG performed; oxygen on pt 2 L [place]; pt is A&Ox3 and talking.

## 2012-04-27 ENCOUNTER — Encounter (HOSPITAL_COMMUNITY)
Admission: EM | Disposition: A | Discharge status: Skilled Nursing Facility | Payer: Self-pay | Source: Home / Self Care | Attending: Internal Medicine

## 2012-04-27 DIAGNOSIS — I495 Sick sinus syndrome: Secondary | ICD-10-CM

## 2012-04-27 DIAGNOSIS — I517 Cardiomegaly: Secondary | ICD-10-CM

## 2012-04-27 DIAGNOSIS — I214 Non-ST elevation (NSTEMI) myocardial infarction: Secondary | ICD-10-CM

## 2012-04-27 HISTORY — PX: PERMANENT PACEMAKER INSERTION: SHX5480

## 2012-04-27 LAB — BASIC METABOLIC PANEL
BUN: 21 mg/dL (ref 6–23)
Calcium: 8.8 mg/dL (ref 8.4–10.5)
Chloride: 95 mEq/L — ABNORMAL LOW (ref 96–112)
Creatinine, Ser: 1.1 mg/dL (ref 0.50–1.35)
GFR calc Af Amer: 68 mL/min — ABNORMAL LOW (ref >90)
GFR calc non Af Amer: 58 mL/min — ABNORMAL LOW (ref >90)

## 2012-04-27 LAB — CARDIAC PANEL(CRET KIN+CKTOT+MB+TROPI)
CK, MB: 67.7 ng/mL (ref 0.3–4.0)
Relative Index: 9 — ABNORMAL HIGH (ref 0.0–2.5)
Relative Index: 8.8 — ABNORMAL HIGH (ref 0.0–2.5)
Relative Index: 6.6 — ABNORMAL HIGH (ref 0.0–2.5)
Total CK: 213 U/L (ref 7–232)
Total CK: 756 U/L — ABNORMAL HIGH (ref 7–232)
Troponin I: 14.89 ng/mL (ref <0.30)
Troponin I: 15.06 ng/mL (ref <0.30)
Troponin I: >25 ng/mL (ref <0.30)

## 2012-04-27 LAB — GLUCOSE, CAPILLARY
Glucose-Capillary: 291 mg/dL — ABNORMAL HIGH (ref 70–99)
Glucose-Capillary: 217 mg/dL — ABNORMAL HIGH (ref 70–99)
Glucose-Capillary: 220 mg/dL — ABNORMAL HIGH (ref 70–99)

## 2012-04-27 SURGERY — PERMANENT PACEMAKER INSERTION
Anesthesia: LOCAL

## 2012-04-27 MED ORDER — LIDOCAINE HCL (PF) 1 % IJ SOLN
INTRAMUSCULAR | Status: AC
  Filled 2012-04-27: qty 60

## 2012-04-27 MED ORDER — HEPARIN (PORCINE) IN NACL 100-0.45 UNIT/ML-% IJ SOLN
1000.0000 [IU]/h | INTRAMUSCULAR | Status: DC
  Filled 2012-04-27: qty 250

## 2012-04-27 MED ORDER — SODIUM CHLORIDE 0.45 % IV SOLN
INTRAVENOUS | Status: DC
  Administered 2012-04-27: 16:00:00 via INTRAVENOUS

## 2012-04-27 MED ORDER — FENTANYL CITRATE 0.05 MG/ML IJ SOLN
INTRAMUSCULAR | Status: AC
  Filled 2012-04-27: qty 2

## 2012-04-27 MED ORDER — HEPARIN (PORCINE) IN NACL 2-0.9 UNIT/ML-% IJ SOLN
INTRAMUSCULAR | Status: AC
  Filled 2012-04-27: qty 1000

## 2012-04-27 MED ORDER — LOSARTAN POTASSIUM 50 MG PO TABS
100.0000 mg | ORAL_TABLET | Freq: Every day | ORAL | Status: DC
  Administered 2012-04-27 – 2012-05-02 (×6): 100 mg via ORAL
  Filled 2012-04-27 (×6): qty 2

## 2012-04-27 MED ORDER — SODIUM CHLORIDE 0.9 % IR SOLN
80.0000 mg | Status: DC
  Filled 2012-04-27: qty 2

## 2012-04-27 MED ORDER — HEPARIN BOLUS VIA INFUSION
3500.0000 [IU] | Freq: Once | INTRAVENOUS | Status: AC
  Administered 2012-04-27: 3500 [IU] via INTRAVENOUS
  Filled 2012-04-27: qty 3500

## 2012-04-27 MED ORDER — ONDANSETRON HCL 4 MG/2ML IJ SOLN
INTRAMUSCULAR | Status: AC
  Filled 2012-04-27: qty 2

## 2012-04-27 MED ORDER — ONDANSETRON HCL 4 MG/2ML IJ SOLN: 4.0000 mg | Freq: Four times a day (QID) | INTRAMUSCULAR | Status: DC | PRN

## 2012-04-27 MED ORDER — NON FORMULARY: 10.0000 mg | Freq: Every day | Status: DC

## 2012-04-27 MED ORDER — CHLORHEXIDINE GLUCONATE 4 % EX LIQD: 60.0000 mL | Freq: Once | CUTANEOUS | Status: DC

## 2012-04-27 MED ORDER — VANCOMYCIN HCL IN DEXTROSE 1-5 GM/200ML-% IV SOLN
1000.0000 mg | INTRAVENOUS | Status: DC
  Filled 2012-04-27: qty 200

## 2012-04-27 MED ORDER — ROSUVASTATIN CALCIUM 10 MG PO TABS
10.0000 mg | ORAL_TABLET | Freq: Every day | ORAL | Status: DC
  Administered 2012-04-27 – 2012-05-02 (×4): 10 mg via ORAL
  Filled 2012-04-27 (×6): qty 1

## 2012-04-27 MED ORDER — VANCOMYCIN HCL IN DEXTROSE 1-5 GM/200ML-% IV SOLN
1000.0000 mg | Freq: Two times a day (BID) | INTRAVENOUS | Status: AC
  Administered 2012-04-27: 1000 mg via INTRAVENOUS
  Filled 2012-04-27: qty 200

## 2012-04-27 MED ORDER — SODIUM CHLORIDE 0.9 % IV SOLN: 250.0000 mL | INTRAVENOUS | Status: DC

## 2012-04-27 MED ORDER — CHLORHEXIDINE GLUCONATE 4 % EX LIQD
CUTANEOUS | Status: AC
  Administered 2012-04-27: 11:00:00
  Filled 2012-04-27: qty 15

## 2012-04-27 MED ORDER — SODIUM CHLORIDE 0.9 % IJ SOLN
3.0000 mL | Freq: Two times a day (BID) | INTRAMUSCULAR | Status: DC
  Administered 2012-04-30 (×2): 3 mL via INTRAVENOUS

## 2012-04-27 MED ORDER — ACETAMINOPHEN 325 MG PO TABS: 325.0000 mg | ORAL_TABLET | ORAL | Status: DC | PRN

## 2012-04-27 MED ORDER — SODIUM CHLORIDE 0.9 % IJ SOLN: 3.0000 mL | INTRAMUSCULAR | Status: DC | PRN

## 2012-04-27 MED ORDER — MIDAZOLAM HCL 5 MG/5ML IJ SOLN
INTRAMUSCULAR | Status: AC
  Filled 2012-04-27: qty 5

## 2012-04-27 NOTE — Consult Note (Signed)
Reason for Consult:symptomatic tachybrady  Referring Physician: [name] is an 78 y.o. male.   HPI: The patient is an 78 yo man with a h/o cad, s/p CABG times 2. He has had palpitations for years and documented SVT and atrial fib. He developed bradycardia several weeks ago and had to have his beta blocker stopped. He presented yesterday with tachypalpitations and sustained a NSTEMI thought secondary to rapid SVT. He was initially bradycardic. He had a syncopal episode several weeks ago. He denies chest pain except when his heart races.  PMH: Past Medical History  Diagnosis Date  . Ischemic heart disease     original CABG in 1984 with redo CABG in 1992; managed medically  . HTN (hypertension)   . Angina pectoris   . Hypercholesterolemia   . CHF (congestive heart failure)     EF is 35 to 40% per echo April 2013  . Arthritis   . Urinary incontinence   . Myocardial infarction   . Dysrhythmia     irregular  heart beats per patient  . Asthma   . Shortness of breath   . Diabetes mellitus     type 2  . GERD (gastroesophageal reflux disease)   . Neuromuscular disorder     periferal neruropathy  . Depression   . Syncope   . Subarachnoid hemorrhage April 2013    from fall/coumadin & aspirin stopped    PSHX: Past Surgical History  Procedure Date  . Cardiac catheterization 03/18/1998  . Coronary artery bypass graft [id number]    first at [facility]  . Circumcision 02/05/08  . Eye surgery   . Cardiac stents     FAMHX: Family History  Problem Relation Age of Onset  . Heart attack Father     Social History:  reports that he has never smoked. He has never used smokeless tobacco. He reports that he does not drink alcohol or use illicit drugs.  Allergies:  Allergies  Allergen Reactions  . Penicillins Anaphylaxis and Swelling  . Lipitor (Atorvastatin Calcium) Other (See Comments)    Gi issues  . Macrodantin Other (See Comments)    Unknown   .  Morphine And Related Other (See Comments)    "doesn't feel right"  . Ranolazine Er Other (See Comments)    constipation  . Statins Other (See Comments)    Leg weakness    Medications: reviewed  Dg Chest Portable 1 View  04/26/2012  *RADIOLOGY REPORT*  Clinical Data: Chest pain.  PORTABLE CHEST - 1 VIEW  Comparison: Chest radiograph performed 04/05/2012  Findings: The lungs are well-aerated.  Mild bibasilar airspace opacities likely reflect atelectasis or scarring.  There is no evidence of pleural effusion or pneumothorax.  The cardiomediastinal silhouette is borderline normal in size; the patient is status post median sternotomy.  There are fractures of all of the visualized sternal wires.  No acute osseous abnormalities are seen.  IMPRESSION: Mild bibasilar airspace opacities may reflect atelectasis or scarring; no definite superimposed airspace consolidation seen.  Original Report Authenticated By: Tonia Ghent, M.D.    ROS  As stated in the HPI and negative for all other systems.  Physical Exam  Vitals:Blood pressure 178/81, pulse 67, temperature 97.6 F (36.4 C), temperature source Oral, resp. rate 19, height 5\' 9"  (1.753 m), weight 166 lb 0.1 oz (75.3 kg), SpO2 98.00%.  Well appearing elderly man, NAD HEENT: Unremarkable Neck:  No JVD, no thyromegally Lymphatics:  No adenopathy Back:  No CVA tenderness Lungs:  Clear with no wheezes, rales, or rhonchi HEART:  Regular rate rhythm, no murmurs, no rubs, no clicks Abd:  Flat, positive bowel sounds, no organomegally, no rebound, no guarding Ext:  2 plus pulses, no edema, no cyanosis, no clubbing Skin:  No rashes no nodules Neuro:  CN II through XII intact, motor grossly intact  Tele - SB/NSR with RBBB Assessment/Plan: 1. Symptomatic tachybrady 2. NSTEMI secondary to 1 in the setting of advanced CAD 3. SVT, terminated with adenosine. 4. CAD Rec: I have discussed the treatment options with the patient, his family, and Drs.  Crenshaw, Brackbill and Bensimohn. Theree are multiple ways to treat his problem but I think the most reasonable approach would be to place a PPM for back up rate support and then uptitrate his beta blocker and continue low dose amiodarone. I do not think that catheterization likely to be able to do anything that might help him.  Benjamin Noble, M.D.  Benjamin Gowda TaylorMD 04/27/2012, 10:14 AM

## 2012-04-27 NOTE — Progress Notes (Signed)
Benjamin Noble Internal Medicine Resident Note  Subjective:  Denies chest pain, shortness of breath, palpitations, or abdominal pain.  He did have one episode of emesis overnight.  States his appetite is down and that he's not sure if he is making good amounts of urine.    Objective:  Vital Signs in the last 24 hours: Filed Vitals:   04/27/12 0400 04/27/12 0500 04/27/12 0600 04/27/12 0700  BP: 138/76 167/87 145/74 162/63  Pulse: 66 68 66 56  Temp: 97.6 F (36.4 C)     TempSrc: Oral     Resp: 17 17 18 18   Height:      Weight:  166 lb 0.1 oz (75.3 kg)    SpO2: 98% 98% 98% 98%   Intake/Output from previous day: 05/08 0701 - 05/09 0700 In: 1339.7 [P.O.:480; I.V.:857.7; IV Piggyback:2] Out: 850 [Urine:850]  24-hour weight change: Weight change:   Weight trends: Filed Weights   04/26/12 1034 04/27/12 0500  Weight: 164 lb 7.4 oz (74.6 kg) 166 lb 0.1 oz (75.3 kg)   Physical Exam: Vitals reviewed. General: resting in bed, NAD HEENT: PERRL, EOMI, no scleral icterus Cardiac: irregularly irregular rhythm, no rubs, murmurs or gallops Pulm: clear to auscultation bilaterally, no wheezes, rales, or rhonchi Abd: soft, nontender, nondistended, BS present Ext: warm and well perfused, no pedal edema Neuro: alert and oriented to self and place but not date, cranial nerves II-XII grossly intact, strength is diminished bilaterally.  Sensation to light touch equal in bilateral upper and lower extremities  Lab Results:  Basename 04/26/12 0548 04/26/12 0516  WBC -- 7.2  HGB 13.9 13.7  PLT -- 230    Basename 04/26/12 0548 04/26/12 0524  NA 138 137  K 4.2 4.4  CL 104 100  CO2 -- 26  GLUCOSE 255* 253*  BUN 34* 35*  CREATININE 1.40* 1.33    Basename 04/27/12 0010 04/26/12 1608  TROPONINI >25.00* >25.00*    Basename 04/27/12 0506 04/26/12 2113  GLUCAP 291* 329*   No results found for this basename: PROBNP:3 in the last 72 hours  Cardiac Studies: Last cath in 1999 showed severe  diffuse disease that was not amenable to PCTA or a 3rd revision of his CABG.  Tele: Tachycardic until noon yesterday when Amio was started.  Since then no definite SVT.  Irregular rate on tele  Scheduled Meds:   . adenosine (ADENOCARD) IV  6 mg Intravenous Once  . aspirin EC  81 mg Oral Once  . diltiazem      . docusate sodium  100 mg Oral Daily  . enoxaparin  40 mg Subcutaneous Q24H  . ferrous sulfate  325 mg Oral QODAY  . furosemide  20 mg Oral Daily  . insulin aspart  0-15 Units Subcutaneous TID WC  . isosorbide mononitrate  30 mg Oral BID  . metoprolol  5 mg Intravenous Once  . pantoprazole  40 mg Oral Daily  . sodium chloride  3 mL Intravenous Q12H  . DISCONTD: adenosine (ADENOCARD) IV  6 mg Intravenous Once  . DISCONTD: metoprolol  2.5 mg Intravenous Q8H   Continuous Infusions:   . amiodarone (NEXTERONE PREMIX) 360 mg/200 mL dextrose 60 mg/hr (04/27/12 0445)  . DISCONTD: amiodarone (NEXTERONE PREMIX) 360 mg/200 mL dextrose    . DISCONTD: diltiazem (CARDIZEM) infusion Stopped (04/26/12 0800)   PRN Meds:.sodium chloride, acetaminophen, ALPRAZolam, nitroGLYCERIN, ondansetron (ZOFRAN) IV, sodium chloride, zolpidem  Imaging: Dg Chest Portable 1 View  04/26/2012  *RADIOLOGY REPORT*  Clinical Data: Chest  pain.  PORTABLE CHEST - 1 VIEW  Comparison: Chest radiograph performed 04/05/2012  Findings: The lungs are well-aerated.  Mild bibasilar airspace opacities likely reflect atelectasis or scarring.  There is no evidence of pleural effusion or pneumothorax.  The cardiomediastinal silhouette is borderline normal in size; the patient is status post median sternotomy.  There are fractures of all of the visualized sternal wires.  No acute osseous abnormalities are seen.  IMPRESSION: Mild bibasilar airspace opacities may reflect atelectasis or scarring; no definite superimposed airspace consolidation seen.  Original Report Authenticated By: Tonia Ghent, M.D.   Assessment/Plan:  1)  Recurrent syncope: Recently admitted for syncope that caused a subarachnoid hemorrhage.  Thought at that time was orthostasis vs hypoglycemia vs cardiac arrythmia from horrible underlying CAD.  Now presents with WCT.    2) Wide-complex tachycardia - suspect SVT with underlying RBBB:  On amiodarone drip since yesterday and HR has been running <100 with irregular rhythm.  Now that rate is controlled with amiodarone we will ask EP to see and consider a pacer for his tachy-brady syndrome. Zoll pads in place but his HR currently is not bradycardic.   3) Elevated Cardiac enzymes: Troponin turned very positive yesterday.  EKG this morning shows Q waves in the anteriolateral leads with some ST depression in the lateral leads.  He is symptom free at this time and his last cath in 1999 showed severe diffuse disease that wasn't amenable to PCTA or repeat CABG.  Will treat medically with heparin drip.  4) Ischemic cardiomyopathy EF 35-40%:  Diffuse hypokinesis also noted on the last Echo from 04/07/12.  He is not a candidate for any other intervention currently and will need to be treated medically.  Currently euvolemic and denies chest pain.   5) CAD s/p CABG x 2: Last CABG in 1992 and cath from 1999 showed severe diffuse disease that was not amenable to intevention.  Managing medically.  6) DM2:  On SSI started last night for elevated blood sugars.  Will continue to monitor.  7) Limited functional capacity:  Will need cardiac rehab and PT assessment as he gets closer to discharge.  Limited functional capacity at home prior to arrival with need for HHPT. May need rehab stay at SNF or CIR depending on PT/Cardiac rehabs recommendations.   Length of Stay: 1 days  Patient history and plan of care reviewed with attending, Dr. Daphane Shepherd, M.D. 04/27/2012, 7:08 AM As above; patient seen and examined; agree with above; Patient admitted with WCT (likely SVT); had bradycardia with amiodarone and  metoprolol (suggests bradycardia may have precipitated recent syncopal episode). He has ruled in for MI (most likely from tachycardia). Discussed with patient. He does not want cath and only medical therapy for MI. Check echo for LV function. Plan Asa; heparin for 48-72 hours. No beta blocker for now given recent bradycardia (could add later after pacemaker). Patient with leg weakness with lipitor in past; try crestor 10 mg daily. Would DC coumadin and treat with plavix given recent acute coronary syndrome (patient with previous SAH but per neurosurgery notes, anticoagulation could be resumed 1-2 weeks from event). I have reviewed tachy-brady with him and he would be agreeable to pacemaker. Will ask EP to see. Continue IV amiodarone for SVT.

## 2012-04-27 NOTE — Progress Notes (Signed)
  Echocardiogram 2D Echocardiogram has been performed.  Cathie Beams Deneen 04/27/2012, 9:38 AM

## 2012-04-27 NOTE — Progress Notes (Signed)
Orthopedic Tech Progress Note Patient Details:  Benjamin Noble December 15, 1922 [id number]  Other Ortho Devices Type of Ortho Device: Other (comment) (arm sling) Ortho Device Location: (L) UE Ortho Device Interventions: Application   Jennye Moccasin 04/27/2012, 8:44 PM

## 2012-04-27 NOTE — Progress Notes (Signed)
ANTICOAGULATION CONSULT NOTE - Initial Consult  Pharmacy Consult for heparin  Indication: chest pain/ACS  Allergies  Allergen Reactions  . Penicillins Anaphylaxis and Swelling  . Lipitor (Atorvastatin Calcium) Other (See Comments)    Gi issues  . Macrodantin Other (See Comments)    Unknown   . Morphine And Related Other (See Comments)    "doesn't feel right"  . Ranolazine Er Other (See Comments)    constipation  . Statins Other (See Comments)    Leg weakness    Patient Measurements: Height: 5\' 9"  (175.3 cm) Weight: 166 lb 0.1 oz (75.3 kg) IBW/kg (Calculated) : 70.7  Heparin Dosing Weight:   Vital Signs: Temp: 97.6 F (36.4 C) (05/09 0400) Temp src: Oral (05/09 0400) BP: 162/63 mmHg (05/09 0700) Pulse Rate: 56  (05/09 0700)  Labs:  Basename 04/27/12 0010 04/26/12 1608 04/26/12 0830 04/26/12 0548 04/26/12 0524 04/26/12 0516  HGB -- -- -- 13.9 -- 13.7  HCT -- -- -- 41.0 -- 39.3  PLT -- -- -- -- -- 230  APTT -- -- -- -- -- --  LABPROT -- -- -- -- 12.9 --  INR -- -- -- -- 0.95 --  HEPARINUNFRC -- -- -- -- -- --  CREATININE -- -- -- 1.40* 1.33 --  CKTOTAL 756* 882* 294* -- -- --  CKMB 67.7* 94.5* 28.7* -- -- --  TROPONINI >25.00* >25.00* 6.37* -- -- --    Estimated Creatinine Clearance: 37.2 ml/min (by C-G formula based on Cr of 1.4).   Medical History: Past Medical History  Diagnosis Date  . Ischemic heart disease     original CABG in 1984 with redo CABG in 1992; managed medically  . HTN (hypertension)   . Angina pectoris   . Hypercholesterolemia   . CHF (congestive heart failure)     EF is 35 to 40% per echo April 2013  . Arthritis   . Urinary incontinence   . Myocardial infarction   . Dysrhythmia     irregular  heart beats per patient  . Asthma   . Shortness of breath   . Diabetes mellitus     type 2  . GERD (gastroesophageal reflux disease)   . Neuromuscular disorder     periferal neruropathy  . Depression   . Syncope   . Subarachnoid  hemorrhage April 2013    from fall/coumadin & aspirin stopped    Medications:  Prescriptions prior to admission  Medication Sig Dispense Refill  . aspirin EC 81 MG tablet Take 81 mg by mouth once. Chest pain      . docusate sodium (COLACE) 100 MG capsule Take 100 mg by mouth daily.       . ferrous sulfate 325 (65 FE) MG tablet Take 325 mg by mouth every other day.       . furosemide (LASIX) 20 MG tablet Take 1 tablet (20 mg total) by mouth daily.  30 tablet  1  . isosorbide mononitrate (IMDUR) 30 MG 24 hr tablet Take 1 tablet (30 mg total) by mouth 2 (two) times daily.  60 tablet  1  . nitroGLYCERIN (NITROSTAT) 0.4 MG SL tablet Place 0.4 mg under the tongue every 5 (five) minutes as needed. For chest pain.      . pantoprazole (PROTONIX) 40 MG tablet Take 40 mg by mouth daily.        . metoprolol tartrate (LOPRESSOR) 25 MG tablet Take 1 tablet (25 mg total) by mouth 2 (two) times daily.  60 tablet  11    Assessment:    NSTEMI.  Goal of Therapy:  Heparin level 0.3-0.7 units/ml    Plan:  Heparin 3500 unitsivx1 then 1000units/hr  8 hour HL  Daily HL and cbc start 5/10  Janice Coffin 04/27/2012,7:48 AM

## 2012-04-27 NOTE — Op Note (Signed)
DDD PPM inserted via the left subclavian vein without immediate complication. W#[id number].

## 2012-04-28 ENCOUNTER — Telehealth: Payer: Self-pay | Admitting: Cardiology

## 2012-04-28 ENCOUNTER — Encounter (HOSPITAL_COMMUNITY): Payer: Self-pay

## 2012-04-28 ENCOUNTER — Inpatient Hospital Stay (HOSPITAL_COMMUNITY): Payer: Medicare Other

## 2012-04-28 ENCOUNTER — Encounter: Payer: Self-pay | Admitting: *Deleted

## 2012-04-28 DIAGNOSIS — Z950 Presence of cardiac pacemaker: Secondary | ICD-10-CM | POA: Insufficient documentation

## 2012-04-28 LAB — GLUCOSE, CAPILLARY
Glucose-Capillary: 187 mg/dL — ABNORMAL HIGH (ref 70–99)
Glucose-Capillary: 216 mg/dL — ABNORMAL HIGH (ref 70–99)
Glucose-Capillary: 123 mg/dL — ABNORMAL HIGH (ref 70–99)

## 2012-04-28 MED ORDER — CARVEDILOL 12.5 MG PO TABS
12.5000 mg | ORAL_TABLET | Freq: Two times a day (BID) | ORAL | Status: DC
  Administered 2012-04-28 – 2012-05-02 (×8): 12.5 mg via ORAL
  Filled 2012-04-28 (×10): qty 1

## 2012-04-28 MED ORDER — METOPROLOL TARTRATE 25 MG PO TABS
25.0000 mg | ORAL_TABLET | Freq: Two times a day (BID) | ORAL | Status: DC
  Administered 2012-04-28: 25 mg via ORAL
  Filled 2012-04-28 (×2): qty 1

## 2012-04-28 MED ORDER — CARVEDILOL 6.25 MG PO TABS: 6.2500 mg | ORAL_TABLET | Freq: Two times a day (BID) | ORAL | Status: DC

## 2012-04-28 MED ORDER — AMIODARONE HCL 200 MG PO TABS
200.0000 mg | ORAL_TABLET | Freq: Every day | ORAL | Status: DC
  Administered 2012-04-28 – 2012-05-02 (×5): 200 mg via ORAL
  Filled 2012-04-28 (×5): qty 1

## 2012-04-28 NOTE — Progress Notes (Signed)
Patient: Benjamin Noble Date of Encounter: 04/28/2012, 12:26 PM Admit date: 04/26/2012     Subjective  Brief summary of hospital course: The patient is an 78 year old man with a history of longstanding coronary disease and recurrent tachypalpitations. He was initially on beta-blockers both for his coronary disease and for his tachypalpitations, but developed severe bradycardia and these had to be discontinued. He subsequently presented with sustained SVT at rates of 140 beats per minute. The SVT was terminated with intravenous adenosine. He was admitted for further evaluation. Dr. Ladona Ridgel discussed multiple approaches to his care including catheter ablation as well as insertion of a permanent pacemaker and restarting his beta-blocker therapy in conjunction with low-dose amiodarone. The latter approach was decided and he is now s/p insertion of a dual-chamber pacemaker.  Today Benjamin Noble reports leg weakness with standing this AM. He usually ambulates with walker. He denies chest pain, shortness of breath, palpitations, dizziness or near-syncope.   Objective   Filed Vitals:   04/28/12 0947  BP: 171/79  Pulse: 96  Temp: 97.7  Resp: 20    Intake/Output Summary (Last 24 hours) at 04/28/12 1226 Last data filed at 04/27/12 1853  Gross per 24 hour  Intake    150 ml  Output      0 ml  Net    150 ml    Physical Exam: General: Well developed, elderly 78 year old male, sitting comfortably in bed, in no acute distress. Head: Normocephalic, atraumatic, sclera non-icteric, no xanthomas, nares are without discharge.  Neck: Supple. JVD not elevated. Lungs: Clear bilaterally to auscultation without wheezes, rales, or rhonchi. Breathing is unlabored. Heart: RRR S1 S2 without murmurs, rubs, or gallops.  Abdomen: Soft, non-distended.  Extremities: No clubbing or cyanosis. No edema.  Distal pedal pulses are 2+ and equal bilaterally. Neuro: Alert and oriented X 3. Moves all extremities  spontaneously. No focal deficits. Skin: PPM implant site intact without edema, warmth, erythema, bleeding or hematoma  Inpatient Medications:  . aspirin EC  81 mg Oral Once  . docusate sodium  100 mg Oral Daily  . ferrous sulfate  325 mg Oral QODAY  . furosemide  20 mg Oral Daily  . insulin aspart  0-15 Units Subcutaneous TID WC  . isosorbide mononitrate  30 mg Oral BID  . losartan  100 mg Oral Daily  . pantoprazole  40 mg Oral Daily  . rosuvastatin  10 mg Oral Daily  . sodium chloride  3 mL Intravenous Q12H  . sodium chloride  3 mL Intravenous Q12H  . vancomycin  1,000 mg Intravenous Q12H    Labs:  Basename 04/27/12 0737 04/26/12 0830 04/26/12 0548 04/26/12 0524  NA 133* -- 138 --  K 4.3 -- 4.2 --  CL 95* -- 104 --  CO2 26 -- -- 26  GLUCOSE 294* -- 255* --  BUN 21 -- 34* --  CREATININE 1.10 -- 1.40* --  CALCIUM 8.8 -- -- 9.1  MG -- 1.7 -- --  PHOS -- -- -- --    Basename 04/26/12 0524  AST 13  ALT 10  ALKPHOS 107  BILITOT 0.4  PROT 7.0  ALBUMIN 3.5    Basename 04/26/12 0548 04/26/12 0516  WBC -- 7.2  NEUTROABS -- --  HGB 13.9 13.7  HCT 41.0 39.3  MCV -- 90.3  PLT -- 230    Basename 04/27/12 2245 04/27/12 1500 04/27/12 0737 04/27/12 0010  CKTOTAL 213 319* 490* 756*  CKMB 11.1* 21.1* 42.9* 67.7*  TROPONINI 14.89* 15.06* >25.00* >25.00*   Radiology/Studies: Dg Chest 2 View  04/28/2012  *RADIOLOGY REPORT*  Clinical Data: Post pacemaker insertion. Shortness of breath.  CHEST - 2 VIEW  Comparison: 04/26/2012.  Findings: .  Sequential pacemaker placed with leads in the region of the right atrium and right ventricle without complication noted. No pneumothorax.  Fractured sternal wires unchanged.  Left costophrenic angle pleural calcifications stable.  Heart size top normal.  Mild central pulmonary vascular prominence. No frank pulmonary edema or segmental infiltrate.  Calcified aorta.  IMPRESSION: Post placement of sequential pacemaker from the left without  complication noted.  Please see above.  Original Report Authenticated By: Fuller Canada, M.D.   Telemetry: normal sinus rhythm at 90 bpm; intermittent A pacing Device interrogation: by industry - shows normal PPM function with stable lead parameters   Assessment and Plan  1.  Tachy-brady syndrome s/p dual chamber PPM (MDT) - stable; PPM function normal with stable lead parameters by interrogation this AM; CXR shows stable lead placement with no pneumothorax; will order PT/OT eval in preparation for possible discharge tomorrow AM 2.  CAD - patient ruled in for NSTEMI but wants conservative management; has no anginal symptoms; continue medical therapy; will add back beta blocker now that he has PPM in place 3.  Ischemic cardiomyopathy, EF 35-40% - euvolemic by exam today with no overt CHF by CXR; continue medical therapy 4.  WCT, probable SVT with underlying RBBB - will add back beta blocker now that he has PPM in place  Signed, EDMISTEN, BROOKE O. PA-C  EP attending  Patient seen and examined. Agree with above. He is too weak for discharge today. Will add coreg and amiodarone for CHF, SVT, PAf, and CAD.   [name], M.D.

## 2012-04-28 NOTE — Telephone Encounter (Signed)
New msg Pt's son wanted to talk to you about appt's he had scheduled. Does he need to keep CT and appt with Lawson Fiscal. He is in hospital now.

## 2012-04-28 NOTE — Telephone Encounter (Signed)
Left message to keep appointments as scheduled

## 2012-04-28 NOTE — Evaluation (Signed)
Physical Therapy Evaluation Patient Details Name: Stylianos Stradling MRN: [id number] DOB: June 12, 1922 Today's Date: 04/28/2012 Time: 7829-5621 PT Time Calculation (min): 30 min  PT Assessment / Plan / Recommendation Clinical Impression  Mr. Gell presents to PT following pacemaker placement (HR at times in 20s prior to placement). Today he is moving well but unsteady and generally weak. Will benefit physical therapy in the acute setting to address the below problem list as well as to maximize safety and mobility for decreased burden of care at home. Rec HHPT and 24 hour assist at home. Pt needs minA for all ambulation and out of bed activity.     PT Assessment  Patient needs continued PT services    Follow Up Recommendations  Home health PT;Supervision/Assistance - 24 hour    Barriers to Discharge  None      lEquipment Recommendations  None recommended by PT    Recommendations for Other Services   OT  Frequency Min 3X/week    Precautions / Restrictions Precautions Precautions: ICD/Pacemaker;Fall         Mobility  Bed Mobility Bed Mobility: Supine to Sit Supine to Sit: 6: Modified independent (Device/Increase time);With rails;HOB elevated (30 degrees) Sitting - Scoot to Edge of Bed: 6: Modified independent (Device/Increase time) Details for Bed Mobility Assistance: pt pulls with RUE to come to sitting Transfers Transfers: Sit to Stand;Stand to Sit Sit to Stand: 4: Min assist;With upper extremity assist;From bed Stand to Sit: 4: Min assist;With armrests;To chair/3-in-1 Details for Transfer Assistance: cues for decreased use of LUE; min facilitation for follow through and sequencing cues for safe technique; poorly controlled stand->sit Ambulation/Gait Ambulation/Gait Assistance: 4: Min assist Ambulation Distance (Feet): 15 Feet Assistive device: Rolling walker Ambulation/Gait Assistance Details: shuffled gait with narrow BOS; cues for upright posture and safe  positioning in RW (tends to ambulate with one foot in and one out); cues for safe technique with RW especially negotiating turns Gait Pattern: Narrow base of support;Trunk flexed;Shuffle    Exercises     PT Diagnosis: Difficulty walking;Abnormality of gait;Generalized weakness  PT Problem List: Decreased activity tolerance;Decreased balance;Decreased mobility;Cardiopulmonary status limiting activity;Decreased strength PT Treatment Interventions: DME instruction;Gait training;Functional mobility training;Therapeutic activities;Therapeutic exercise;Balance training;Neuromuscular re-education;Patient/family education   PT Goals Acute Rehab PT Goals PT Goal Formulation: With patient Time For Goal Achievement: 05/05/12 Pt will go Supine/Side to Sit: Independently;with HOB 0 degrees PT Goal: Supine/Side to Sit - Progress: Goal set today Pt will go Sit to Supine/Side: Independently;with HOB 0 degrees PT Goal: Sit to Supine/Side - Progress: Goal set today Pt will go Sit to Stand: with modified independence PT Goal: Sit to Stand - Progress: Goal set today Pt will go Stand to Sit: with modified independence PT Goal: Stand to Sit - Progress: Goal set today Pt will Ambulate: >150 feet;with modified independence;with least restrictive assistive device PT Goal: Ambulate - Progress: Goal set today  Visit Information  Last PT Received On: 04/28/12 Assistance Needed: +1    Subjective Data  Subjective: I have been blessed and I really appreciate all of the care I've gotten.  Patient Stated Goal: home   Prior Functioning  Home Living Lives With: Spouse Available Help at Discharge: Available 24 hours/day;Personal care attendant;Family (between family and paid help) Type of Home: House Home Access: Ramped entrance Home Layout: One level Bathroom Shower/Tub: Engineer, manufacturing systems: Standard Bathroom Accessibility: Yes How Accessible: Accessible via wheelchair Home Adaptive Equipment:  Shower chair without back;Walker - rolling;Walker - four wheeled;Hand-held Dealer (tub  rail/bar) Prior Function Level of Independence: Needs assistance Needs Assistance: Meal Prep;Light Housekeeping;Bathing Bath: Minimal Driving: No Vocation: Retired    Probation officer Status: Appears within functional limits for tasks assessed/performed Arousal/Alertness: Awake/alert Orientation Level: Appears intact for tasks assessed Behavior During Session: [facility] for tasks performed    Extremity/Trunk Assessment Right Upper Extremity Assessment RUE ROM/Strength/Tone: WFL for tasks assessed RUE Sensation: WFL - Light Touch;WFL - Proprioception RUE Coordination: WFL - gross/fine motor Left Upper Extremity Assessment LUE ROM/Strength/Tone: WFL for tasks assessed LUE Sensation: WFL - Light Touch;WFL - Proprioception LUE Coordination: WFL - gross/fine motor Right Lower Extremity Assessment RLE ROM/Strength/Tone: WFL for tasks assessed RLE Sensation: WFL - Light Touch;WFL - Proprioception RLE Coordination: WFL - gross/fine motor Left Lower Extremity Assessment LLE ROM/Strength/Tone: WFL for tasks assessed LLE Sensation: WFL - Light Touch;WFL - Proprioception LLE Coordination: WFL - gross/fine motor   Balance Static Standing Balance Static Standing - Balance Support: Bilateral upper extremity supported Static Standing - Level of Assistance: 4: Min assist  End of Session PT - End of Session Equipment Utilized During Treatment: Gait belt Activity Tolerance: Patient tolerated treatment well Patient left: in chair;with call bell/phone within reach;with chair alarm set Nurse Communication: Mobility status   WHITLOW,Maxmilian Trostel HELEN 04/28/2012, 3:17 PM

## 2012-04-28 NOTE — Progress Notes (Signed)
Inpatient Diabetes Program Recommendations  AACE/ADA: New Consensus Statement on Inpatient Glycemic Control (2009)  Target Ranges:  Prepandial:   less than 140 mg/dL      Peak postprandial:   less than 180 mg/dL (1-2 hours)      Critically ill patients:  140 - 180 mg/dL   Reason for UEAVW:0/9/81 CBGs [phone] mg/dl    1/91/47  CBGs 829 mg/dl  Inpatient Diabetes Program Recommendations Insulin - Basal: Start Lantus 10 units daily if CBGs continue greater than 180 mg/dl.  Note:

## 2012-04-28 NOTE — Progress Notes (Signed)
Patient had 1 episode of vomiting at 19:00.  PRN Zofran given and PA notified.  Will continue to monitor. Nolon Nations

## 2012-04-28 NOTE — Op Note (Signed)
NAME:  Benjamin Noble, BOCCIO NO.:  [id number]  MEDICAL RECORD NO.:  [id number]  LOCATION:  3742                         FACILITY:  [facility]  PHYSICIAN:  Doylene Canning. Ladona Ridgel, MD    DATE OF BIRTH:  1922/12/12  DATE OF PROCEDURE:  04/27/2012 DATE OF DISCHARGE:                              OPERATIVE REPORT   PROCEDURE PERFORMED:  Insertion of a dual-chamber pacemaker.  INDICATION:  Symptomatic tachy-brady syndrome.  INTRODUCTION:  The patient is an 78 year old man with a history of longstanding coronary disease and recurrent tachypalpitations.  He was initially on beta-blockers both for his coronary disease and for his tachypalpitations, but developed severe bradycardia and these had to be discontinued.  The patient subsequently presented to the emergency room yesterday with sustained SVT at rates of 140 beats per minute.  The SVT was terminated with intravenous adenosine.  We discussed multiple approaches to his care including catheter ablation as well as insertion of a permanent pacemaker and restarting his beta-blocker therapy in conjunction with low-dose amiodarone.  The latter approach was decided upon, and he is now referred for insertion of a dual-chamber pacemaker.  PROCEDURE:  After informed consent was obtained, the patient was taken to the diagnostic EP lab in a fasting state.  After usual preparation and draping, intravenous fentanyl and midazolam was given for sedation. 30 mL of lidocaine was infiltrated into the left infraclavicular region. Electrocautery was utilized to dissect down to the fascial plane. Multiple attempts to puncture the subclavian vein were unsuccessful. Venography 10 mL was carried out to the left upper extremity demonstrating that the left subclavian vein was in fact patent, but displaced medially.  The vein was then punctured and the Medtronic, model 5076, 58-cm active fixation pacing lead, serial number [id number] was advanced into the  right ventricle and a Medtronic, model 5076, 52-cm active fixation pacing lead, serial number [id number] was advanced to the right atrium.  Mapping was first carried out in the right ventricle and at the final site, the R-waves measured 11 mV.  The pacing impedance was a 1000 ohms, the threshold was 0.9 V at 0.5 msec, and a prominent injury current was present with active fixation lead.  With the ventricular lead in satisfactory position, attention was then turned to placement of the atrial lead, which was placed in the anterolateral portion of the right atrium where P-waves initially measured 0.5 to 6 mV, but then increased up to 0.9 mV.  It should be noted that mapping throughout the right atrium demonstrated very small P-waves.  The pace impedance with lead actively fixed was 500 ohms and the threshold was approximately 1.2 V at 0.5 msec.  10 V pacing did not stimulate the diaphragm.  With the atrial and ventricular leads in satisfactory position, the leads were secured to the subpectoral fascia with a figure- of-eight silk suture.  The sewing sleeves were secured with silk suture. Electrocautery was utilized to make a subcutaneous pocket.  Antibiotic irrigation was utilized to irrigate the pocket and electrocautery was utilized to assure hemostasis.  The Medtronic Cynthia dual-chamber pacemaker was then connected to the atrial and ventricular leads and placed back into the subcutaneous pocket.  The pocket was  irrigated with antibiotic irrigation, and the incision was closed with 2-0 and 3-0 Vicryl.  Benzoin and Steri-Strips were painted on the skin, a pressure dressing was applied, and the patient was returned to his room in satisfactory condition.  COMPLICATIONS:  There were no immediate procedure complications.  RESULTS:  This demonstrates successful implantation of a Medtronic dual- chamber pacemaker in a patient with symptomatic tachy-brady syndrome.     Doylene Canning. Ladona Ridgel,  MD     GWT/MEDQ  D:  04/27/2012  T:  04/28/2012  Job:  [id number]  cc:   Cassell Clement, M.D.

## 2012-04-29 DIAGNOSIS — R55 Syncope and collapse: Secondary | ICD-10-CM

## 2012-04-29 DIAGNOSIS — Z950 Presence of cardiac pacemaker: Secondary | ICD-10-CM

## 2012-04-29 LAB — GLUCOSE, CAPILLARY: Glucose-Capillary: 187 mg/dL — ABNORMAL HIGH (ref 70–99)

## 2012-04-29 NOTE — Progress Notes (Signed)
Occupational Therapy Evaluation Patient Details Name: Sherley Leser MRN: [id number] DOB: [birth year] Today's Date: 04/29/2012 Time: 1191-4782 OT Time Calculation (min): 32 min  OT Assessment / Plan / Recommendation Clinical Impression  78 yo s/p pacemaker placement. Pt with NSTEMI.History of  CAD. Pt has 24/7 S available, but according to pt's son, they are not able to lift and assist pt and accomodate his needs. Pt will need SNF placement for rehab prior to returning home. Discussed with pt and son, who are both in agreement for rehab. PT will benefit from skilled OT services to max indep with ADL and functional mobility for ADL to facilitate D/C to next venue of care.    OT Assessment  Patient needs continued OT Services    Follow Up Recommendations  Skilled nursing facility    Barriers to Discharge Decreased caregiver support;Other (comment) (son states caregivers can not provide level of assist needed)    Equipment Recommendations  Defer to next venue    Recommendations for Other Services    Frequency  Min 2X/week    Precautions / Restrictions Precautions Precautions: ICD/Pacemaker;Fall Required Braces or Orthoses: Other Brace/Splint (sling LUE)   Pertinent Vitals/Pain No pain    ADL  Eating/Feeding: Performed;Set up Where Assessed - Eating/Feeding: Edge of bed Grooming: Simulated;Minimal assistance Where Assessed - Grooming: Supported sitting Upper Body Bathing: Moderate assistance;Simulated Where Assessed - Upper Body Bathing: Supported;Sitting, bed Lower Body Bathing: Maximal assistance;Simulated Where Assessed - Lower Body Bathing: Sit to stand from bed;Supported Upper Body Dressing: Simulated;Moderate assistance Where Assessed - Upper Body Dressing: Sitting, chair;Supported Lower Body Dressing: Simulated;Maximal assistance Where Assessed - Lower Body Dressing: Sit to stand from bed;Supported Toilet Transfer: Not assessed (pt declined) Ambulation Related to  ADLs: pt declined    OT Diagnosis: Generalized weakness  OT Problem List: Decreased strength;Decreased activity tolerance;Impaired balance (sitting and/or standing);Decreased safety awareness;Decreased knowledge of use of DME or AE;Decreased knowledge of precautions;Cardiopulmonary status limiting activity OT Treatment Interventions: Self-care/ADL training;Therapeutic exercise;Energy conservation;DME and/or AE instruction;Therapeutic activities;Patient/family education;Balance training   OT Goals Acute Rehab OT Goals OT Goal Formulation: With patient Time For Goal Achievement: 05/13/12 Potential to Achieve Goals: Good ADL Goals Pt Will Perform Grooming: with supervision;Standing at sink;Supported ADL Goal: Grooming - Progress: Goal set today Pt Will Perform Upper Body Bathing: with min assist;Sitting, edge of bed;with cueing (comment type and amount);Unsupported ADL Goal: Upper Body Bathing - Progress: Goal set today Pt Will Perform Lower Body Bathing: with mod assist;with cueing (comment type and amount);Sit to stand from bed;Unsupported ADL Goal: Lower Body Bathing - Progress: Goal set today Pt Will Perform Upper Body Dressing: with min assist;Sitting, bed;Unsupported ADL Goal: Upper Body Dressing - Progress: Goal set today Pt Will Perform Lower Body Dressing: with mod assist;Sit to stand from bed;Unsupported;with cueing (comment type and amount) ADL Goal: Lower Body Dressing - Progress: Goal set today Pt Will Transfer to Toilet: with min assist;Ambulation;with DME;3-in-1;with cueing (comment type and amount) ADL Goal: Toilet Transfer - Progress: Goal set today Pt Will Perform Toileting - Clothing Manipulation: with supervision;Standing ADL Goal: Toileting - Clothing Manipulation - Progress: Goal set today Pt Will Perform Toileting - Hygiene: with supervision;Standing at 3-in-1/toilet;with cueing (comment type and amount) ADL Goal: Toileting - Hygiene - Progress: Goal set today  Visit  Information  Last OT Received On: 04/29/12 Assistance Needed: +2    Subjective Data      Prior Functioning  Home Living Lives With: Spouse Available Help at Discharge: Available 24 hours/day;Personal care attendant;Family (between family  and paid help) Type of Home: House Home Access: Ramped entrance Home Layout: One level Bathroom Shower/Tub: Engineer, manufacturing systems: Standard Bathroom Accessibility: Yes How Accessible: Accessible via wheelchair Home Adaptive Equipment: Shower chair without back;Walker - rolling;Walker - four wheeled;Hand-held Dealer;Bedside commode/3-in-1 (tub rail/bar) Prior Function Level of Independence: Needs assistance Needs Assistance: Meal Prep;Light Housekeeping;Bathing Bath: Minimal Able to Take Stairs?: No Driving: No Comments: has ramp Communication Communication: No difficulties Dominant Hand: Right    Cognition  Overall Cognitive Status: Appears within functional limits for tasks assessed/performed Arousal/Alertness: Awake/alert Orientation Level: Appears intact for tasks assessed Behavior During Session: [facility] for tasks performed    Extremity/Trunk Assessment Right Upper Extremity Assessment RUE ROM/Strength/Tone: Within functional levels Left Upper Extremity Assessment LUE ROM/Strength/Tone: Deficits;Due to precautions   Mobility Transfers Transfers: Sit to Stand;Stand to Sit Details for Transfer Assistance: +2 total A. Pt states he can not feel his feet           End of Session OT - End of Session Activity Tolerance: Patient limited by fatigue Patient left: in bed;with call bell/phone within reach Nurse Communication: Mobility status; D/C planning   Maribeth Jiles,HILLARY 04/29/2012, 1:04 PM [facility], OTR/L  [phone] 04/29/2012

## 2012-04-29 NOTE — Progress Notes (Signed)
PROGRESS NOTE  Subjective:   Benjamin Noble is an 78 y.o. gentleman admitted 4/8 with syncope.    He has a history of CAD s/p CABG in 1984 with re-do 1992.He also has DM2, neuropathy, RBBB and d/o SAH from a fall. He is very limited at baseline and gets around with walker and motorized scooter.   He is now s/p pacer.  He has had progressive decline in his overall strength since admission.  He was too weak to sit up for me - even with lots of assistance.   Objective:    Vital Signs:   Temp:  [98.1 F (36.7 C)-98.9 F (37.2 C)] 98.9 F (37.2 C) (05/11 0500) Pulse Rate:  [64-98] 64  (05/11 1131) Resp:  [16-18] 18  (05/11 0500) BP: (96-140)/(55-87) 111/55 mmHg (05/11 1131) SpO2:  [92 %-97 %] 92 % (05/11 0500) Weight:  [181 lb (82.101 kg)] 181 lb (82.101 kg) (05/10 2043)  Last BM Date: 04/26/12   24-hour weight change: Weight change:   Weight trends: Filed Weights   04/26/12 1034 04/27/12 0500 04/28/12 2043  Weight: 164 lb 7.4 oz (74.6 kg) 166 lb 0.1 oz (75.3 kg) 181 lb (82.101 kg)    Intake/Output:  05/10 0701 - 05/11 0700 In: 300 [P.O.:300] Out: 650 [Urine:650]     Physical Exam: BP 111/55  Pulse 64  Temp(Src) 98.9 F (37.2 C) (Oral)  Resp 18  Ht 5\' 9"  (1.753 m)  Wt 181 lb (82.101 kg)  BMI 26.73 kg/m2  SpO2 92%  General: Vital signs reviewed and noted. Well-developed, well-nourished, in no acute distress; was asleep - easily awakened.  Head: Normocephalic, atraumatic.  Eyes: conjunctivae/corneas clear. PERRL, EOM's intact. Fundi benign.  Throat: Oropharynx nonerythematous, no exudate appreciated.   Neck: Supple. Normal carotids. No JVD  Lungs:  Clear bilaterally to auscultation without wheezes, rales, or rhonchi. Breathing is unlabored.  Heart: Regular rate,  With normal  S1 S2. No murmurs, rubs, or gallops   Abdomen:  Soft, non-tender, non-distended with normoactive bowel sounds. No hepatomegaly. No rebound/guarding. No abdominal masses.  Extremities: No  edema.  Distal pedal pulses are 2+ and equal bilaterally.  Neurologic: A&O X3, CN II - XII are grossly intact. Motor strength is 5/5 in the all 4 extremities.  Psych:     Labs: BMET:  Basename 04/27/12 0737  NA 133*  K 4.3  CL 95*  CO2 26  GLUCOSE 294*  BUN 21  CREATININE 1.10  CALCIUM 8.8  MG --  PHOS --    Liver function tests: No results found for this basename: AST:2,ALT:2,ALKPHOS:2,BILITOT:2,PROT:2,ALBUMIN:2 in the last 72 hours No results found for this basename: LIPASE:2,AMYLASE:2 in the last 72 hours  CBC: No results found for this basename: WBC:2,NEUTROABS:2,HGB:2,HCT:2,MCV:2,PLT:2 in the last 72 hours  Cardiac Enzymes:  Basename 04/27/12 2245 04/27/12 1500 04/27/12 0737 04/27/12 0010  CKTOTAL 213 319* 490* 756*  CKMB 11.1* 21.1* 42.9* 67.7*  TROPONINI 14.89* 15.06* >25.00* >25.00*    Coagulation Studies: No results found for this basename: LABPROT:5,INR:5 in the last 72 hours  Other: Tele:  Ventricular pacing  Medications:    Infusions:    . sodium chloride      Scheduled Medications:    . amiodarone  200 mg Oral Daily  . aspirin EC  81 mg Oral Once  . carvedilol  12.5 mg Oral BID WC  . docusate sodium  100 mg Oral Daily  . ferrous sulfate  325 mg Oral QODAY  . furosemide  20 mg  Oral Daily  . insulin aspart  0-15 Units Subcutaneous TID WC  . isosorbide mononitrate  30 mg Oral BID  . losartan  100 mg Oral Daily  . pantoprazole  40 mg Oral Daily  . rosuvastatin  10 mg Oral Daily  . sodium chloride  3 mL Intravenous Q12H  . sodium chloride  3 mL Intravenous Q12H  . DISCONTD: carvedilol  6.25 mg Oral BID WC  . DISCONTD: metoprolol tartrate  25 mg Oral BID    Assessment/ Plan:    1. Syncope:  He was found to have significant pauses and now has a pacemaker.  He is very weak and cannot even sit up with assistance.   I do not think he can go home - even with 24 hr assistance.  I think he would need nursing home placement.  Perhaps he can  regain some strength so that he can return home.  continue inpatient PT   Length of Stay: 3  Alvia Grove., MD, [facility] 04/29/2012, 12:07 PM

## 2012-04-30 ENCOUNTER — Inpatient Hospital Stay (HOSPITAL_COMMUNITY): Payer: Medicare Other

## 2012-04-30 LAB — GLUCOSE, CAPILLARY
Glucose-Capillary: 170 mg/dL — ABNORMAL HIGH (ref 70–99)
Glucose-Capillary: 332 mg/dL — ABNORMAL HIGH (ref 70–99)

## 2012-04-30 MED ORDER — INSULIN ASPART 100 UNIT/ML ~~LOC~~ SOLN
0.0000 [IU] | Freq: Three times a day (TID) | SUBCUTANEOUS | Status: DC
  Administered 2012-05-01: 3 [IU] via SUBCUTANEOUS
  Administered 2012-05-01: 8 [IU] via SUBCUTANEOUS
  Administered 2012-05-01: 5 [IU] via SUBCUTANEOUS
  Administered 2012-05-02: 11 [IU] via SUBCUTANEOUS
  Administered 2012-05-02: 3 [IU] via SUBCUTANEOUS

## 2012-04-30 MED ORDER — INSULIN ASPART 100 UNIT/ML ~~LOC~~ SOLN
0.0000 [IU] | Freq: Every day | SUBCUTANEOUS | Status: DC
  Administered 2012-04-30: 4 [IU] via SUBCUTANEOUS
  Administered 2012-05-01: 2 [IU] via SUBCUTANEOUS

## 2012-04-30 NOTE — Clinical Social Work Psychosocial (Signed)
    Clinical Social Work Department BRIEF PSYCHOSOCIAL ASSESSMENT 04/30/2012  Patient:  Benjamin Noble, Benjamin Noble     Account Number:  [id number]     Admit date:  04/26/2012  Clinical Social Worker:  Lourdes Sledge  Date/Time:  04/30/2012 11:00 AM  Referred by:  Physician  Date Referred:  04/29/2012 Referred for  SNF Placement   Other Referral:   Interview type:  Patient Other interview type:   Also spoke with pt son Benjamin Noble in pt room.    PSYCHOSOCIAL DATA Living Status:  FAMILY Admitted from facility:   Level of care:   Primary support name:  Benjamin Noble Primary support relationship to patient:  CHILD, ADULT Degree of support available:   Pt has 24hr care at home however family and pt would like pt to receive PT at SNF prior to returning home.    CURRENT CONCERNS Current Concerns  Post-Acute Placement   Other Concerns:    SOCIAL WORK ASSESSMENT / PLAN CSW spoke with both pt and son Benjamin Noble who are agreeable for ST placment for pt at dc. Son prefers placement at [facility] however is agreeable in [place].   Assessment/plan status:  Psychosocial Support/Ongoing Assessment of Needs Other assessment/ plan:   Information/referral to community resources:   CSW provided pt and son with a Engineering geologist.    PATIENT'S/FAMILY'S RESPONSE TO PLAN OF CARE: Pt and son in the room. Pt appears alert and oriented however son assists with decision making. Both agreeable to placement in [facility].CSW will fax out.

## 2012-04-30 NOTE — Progress Notes (Addendum)
PROGRESS NOTE  Subjective:   [name] is an 78 y.o. gentleman admitted 4/8 with syncope.    He has a history of CAD s/p CABG in 1984 with re-do 1992.He also has DM2, neuropathy, RBBB and  A subarachnoid hematome from a fall. He is very limited at baseline and gets around with walker and motorized scooter.   He is now s/p pacer.  He has had progressive decline in his overall strength since admission.  He was too weak to sit up for me - even with lots of assistance.  OT has evaluated and agrees that he needs SNF / rehab.  He had some hallucinations last night.  Better this am    Objective:    Vital Signs:   Temp:  [97.5 F (36.4 C)-98.5 F (36.9 C)] 97.5 F (36.4 C) (05/12 0500) Pulse Rate:  [60-66] 66  (05/12 0811) Resp:  [18-20] 18  (05/12 0500) BP: (111-132)/(55-66) 132/62 mmHg (05/12 0811) SpO2:  [95 %-96 %] 95 % (05/12 0500) Weight:  [170 lb 13.7 oz (77.5 kg)] 170 lb 13.7 oz (77.5 kg) (05/12 0500)  Last BM Date: 04/26/12   24-hour weight change: Weight change: -10 lb 2.3 oz (-4.601 kg)  Weight trends: Filed Weights   04/27/12 0500 04/28/12 2043 04/30/12 0500  Weight: 166 lb 0.1 oz (75.3 kg) 181 lb (82.101 kg) 170 lb 13.7 oz (77.5 kg)    Intake/Output:  05/11 0701 - 05/12 0700 In: 183 [P.O.:180; I.V.:3] Out: 500 [Urine:500]     Physical Exam: BP 132/62  Pulse 66  Temp(Src) 97.5 F (36.4 C) (Oral)  Resp 18  Ht 5\' 9"  (1.753 m)  Wt 170 lb 13.7 oz (77.5 kg)  BMI 25.23 kg/m2  SpO2 95%  General: Vital signs reviewed and noted. Well-developed, well-nourished, in no acute distress; was asleep - easily awakened.  Head: Normocephalic, atraumatic.  Eyes: conjunctivae/corneas clear. PERRL, EOM's intact. Fundi benign.  Throat: Oropharynx nonerythematous, no exudate appreciated.   Neck: Supple. Normal carotids. No JVD  Lungs:  Clear bilaterally to auscultation without wheezes, rales, or rhonchi. Breathing is unlabored.  Heart: Regular rate,  With normal  S1  S2. No murmurs, rubs, or gallops   Abdomen:  Soft, non-tender, non-distended with normoactive bowel sounds. No hepatomegaly. No rebound/guarding. No abdominal masses.  Extremities: No edema.  Distal pedal pulses are 2+ and equal bilaterally.  Neurologic: A&O X3, CN II - XII are grossly intact. Motor strength is 5/5 in the all 4 extremities.  Psych:     Labs: BMET: No results found for this basename: NA:2,K:2,CL:2,CO2:2,GLUCOSE:2,BUN:2,CREATININE:2,CALCIUM:2,MG:2,PHOS:2 in the last 72 hours  Liver function tests: No results found for this basename: AST:2,ALT:2,ALKPHOS:2,BILITOT:2,PROT:2,ALBUMIN:2 in the last 72 hours No results found for this basename: LIPASE:2,AMYLASE:2 in the last 72 hours  CBC: No results found for this basename: WBC:2,NEUTROABS:2,HGB:2,HCT:2,MCV:2,PLT:2 in the last 72 hours  Cardiac Enzymes:  Basename 04/27/12 2245 04/27/12 1500  CKTOTAL 213 319*  CKMB 11.1* 21.1*  TROPONINI 14.89* 15.06*    Coagulation Studies: No results found for this basename: LABPROT:5,INR:5 in the last 72 hours  Other: Tele:  Ventricular pacing  Medications:    Infusions:    . sodium chloride      Scheduled Medications:    . amiodarone  200 mg Oral Daily  . aspirin EC  81 mg Oral Once  . carvedilol  12.5 mg Oral BID WC  . docusate sodium  100 mg Oral Daily  . ferrous sulfate  325 mg Oral QODAY  .  furosemide  20 mg Oral Daily  . insulin aspart  0-15 Units Subcutaneous TID WC  . isosorbide mononitrate  30 mg Oral BID  . losartan  100 mg Oral Daily  . pantoprazole  40 mg Oral Daily  . rosuvastatin  10 mg Oral Daily  . sodium chloride  3 mL Intravenous Q12H  . sodium chloride  3 mL Intravenous Q12H    Assessment/ Plan:    1. Syncope:  He was found to have significant pauses and now has a pacemaker.  He is very weak and cannot even sit up with assistance.   I do not think he can go home - even with 24 hr assistance.  I think he would need nursing home placement.   Perhaps he can regain some strength so that he can return home.  continue inpatient PT  2. Subarachnoid Hematoma - he was scheduled to get a repeat head CT in the office on Tuesday.  We will order tomorrow.   Length of Stay: 4  Vesta Mixer, Montez Hageman., MD, [facility] 04/30/2012, 10:07 AM

## 2012-04-30 NOTE — Clinical Social Work Placement (Addendum)
    Clinical Social Work Department CLINICAL SOCIAL WORK PLACEMENT NOTE 04/30/2012  Patient:  Benjamin Noble, Benjamin Noble  Account Number:  [id number] Admit date:  04/26/2012  Clinical Social Worker:  Theresia Bough, Theresia Majors  Date/time:  04/30/2012 11:11 AM  Clinical Social Work is seeking post-discharge placement for this patient at the following level of care:   SKILLED NURSING   (*CSW will update this form in Epic as items are completed)   04/30/2012  Patient/family provided with [facility] Department of Clinical Social Work's list of facilities offering this level of care within the geographic area requested by the patient (or if unable, by the patient's family).    Patient/family informed of their freedom to choose among providers that offer the needed level of care, that participate in Medicare, Medicaid or managed care program needed by the patient, have an available bed and are willing to accept the patient.    Patient/family informed of [facility]' ownership interest in [facility], as well as of the fact that they are under no obligation to receive care at this facility.  PASARR submitted to EDS on 04/30/2012 PASARR number received from EDS on 05/01/2012   FL2 transmitted to all facilities in geographic area requested by pt/family on  04/30/2012 FL2 transmitted to all facilities within larger geographic area on   Patient informed that his/her managed care company has contracts with or will negotiate with  certain facilities, including the following:     Patient/family informed of bed offers received:  05/01/2012  Patient chooses bed at [facility] Physician recommends and patient chooses bed at    Patient to be transferred to [facility]   on  05/02/2012   Patient to be transferred to facility by   The following physician request were entered in Epic:   Additional Comments:

## 2012-04-30 NOTE — Progress Notes (Signed)
Occupational Therapy Treatment Patient Details Name: Benjamin Noble MRN: [id number] DOB: November 05, 1922 Today's Date: 04/30/2012 Time: 4098-1191 OT Time Calculation (min): 21 min  OT Assessment / Plan / Recommendation Comments on Treatment Session Improved performance today. Unsteady gait. Shuffling gait when backing up to chair.     Follow Up Recommendations  Skilled nursing facility    Barriers to Discharge       Equipment Recommendations  Defer to next venue    Recommendations for Other Services    Frequency Min 2X/week   Plan Discharge plan remains appropriate    Precautions / Restrictions Precautions Precautions: ICD/Pacemaker;Fall Required Braces or Orthoses: Other Brace/Splint   Pertinent Vitals/Pain No pain    ADL  Eating/Feeding: Performed;Modified independent Grooming: Performed;Minimal assistance Where Assessed - Grooming: Standing at sink Equipment Used: Rolling walker;Gait belt Ambulation Related to ADLs: min to mod A at times. several LOB. MAx A to walk backwards - backing up to chair. Shuffling gait noted and max difficualty with initiation of movemnet. Pt self cuing to "walk in the walker and stand up straight".     OT Diagnosis:    OT Problem List:   OT Treatment Interventions:     OT Goals Acute Rehab OT Goals OT Goal Formulation: With patient Time For Goal Achievement: 05/13/12 Potential to Achieve Goals: Good ADL Goals Pt Will Perform Grooming: with supervision;Standing at sink;Supported ADL Goal: Grooming - Progress: Progressing toward goals Pt Will Perform Upper Body Bathing: with min assist;Sitting, edge of bed;with cueing (comment type and amount);Unsupported ADL Goal: Upper Body Bathing - Progress: Progressing toward goals Pt Will Perform Lower Body Bathing: with mod assist;with cueing (comment type and amount);Sit to stand from bed;Unsupported ADL Goal: Lower Body Bathing - Progress: Progressing toward goals Pt Will Perform Upper Body  Dressing: with min assist;Sitting, bed;Unsupported ADL Goal: Upper Body Dressing - Progress: Progressing toward goals Pt Will Perform Lower Body Dressing: with mod assist;Sit to stand from bed;Unsupported;with cueing (comment type and amount) ADL Goal: Lower Body Dressing - Progress: Progressing toward goals Pt Will Transfer to Toilet: with min assist;Ambulation;with DME;3-in-1;with cueing (comment type and amount) ADL Goal: Toilet Transfer - Progress: Progressing toward goals Pt Will Perform Toileting - Clothing Manipulation: with supervision;Standing ADL Goal: Toileting - Clothing Manipulation - Progress: Progressing toward goals Pt Will Perform Toileting - Hygiene: with supervision;Standing at 3-in-1/toilet;with cueing (comment type and amount) ADL Goal: Toileting - Hygiene - Progress: Progressing toward goals Arm Goals Additional Arm Goal #1: Pt will independently perform HEP in order to increase bil. UE strength and activity tolerance. Arm Goal: Additional Goal #1 - Progress: Progressing toward goals  Visit Information  Last OT Received On: 04/30/12               Cognition  Overall Cognitive Status: Appears within functional limits for tasks assessed/performed Arousal/Alertness: Awake/alert Orientation Level: Appears intact for tasks assessed Behavior During Session: [facility] for tasks performed Cognition - Other Comments:  (pt states he has been hallucinating. nsg aware)    Mobility Transfers Transfers: Sit to Stand;Stand to Sit Sit to Stand: 2: Max assist;With upper extremity assist;From chair/3-in-1 Stand to Sit: 3: Mod assist;To chair/3-in-1;With upper extremity assist Details for Transfer Assistance: cues to not use LUE to push/pull   Exercises  BUE general exercises  Balance  MAx A at times  End of Session  left in chair with alarm on . Call bell within reach.   Naya Ilagan,HILLARY 04/30/2012, 2:13 PM [facility], OTR/L  [phone] 04/30/2012

## 2012-04-30 NOTE — Progress Notes (Signed)
Pt called nursing in room earlier this am to alert staff that he was hallucinating.  He stated that he saw a man behind the curtain and heard people talking.  He also asked if the man was standing behind nursing staff.  Pt noted sign above hand sanitizer station that did have a man on it.  Sign removed from wall.  Pt reoriented easily, all other neuro findings WNL.  Pt aware of hallucinations and is concerned.  Only medications administered during shift was imdur at hs per orders.  Will continue to monitor.

## 2012-05-01 DIAGNOSIS — I499 Cardiac arrhythmia, unspecified: Secondary | ICD-10-CM

## 2012-05-01 LAB — GLUCOSE, CAPILLARY
Glucose-Capillary: 189 mg/dL — ABNORMAL HIGH (ref 70–99)
Glucose-Capillary: 239 mg/dL — ABNORMAL HIGH (ref 70–99)

## 2012-05-01 NOTE — Progress Notes (Signed)
CSW spoke with pt, pt son, and pt daughter regarding bed offers. CSW awaiting decision regarding [facility] if bed offer can be extended. CSW informed pt and pt family that pt may be medically stable for discharge today as per discussion with rn.    .Clinical social worker continuing to follow pt to assist with pt dc plans and further csw needs.  Catha Gosselin, Theresia Majors  [phone] .05/01/2012 12:12pm

## 2012-05-01 NOTE — Progress Notes (Signed)
Physical Therapy Treatment Patient Details Name: Benjamin Noble MRN: [id number] DOB: 1922-06-24 Today's Date: 05/01/2012 Time: 4098-1191 PT Time Calculation (min): 26 min  PT Assessment / Plan / Recommendation Comments on Treatment Session  Patient with improving functional abilities. Needs cueing regarding pacemeker precautions secondary to recent placement.    Follow Up Recommendations  Home health PT;Supervision/Assistance - 24 hour    Barriers to Discharge  None      Equipment Recommendations  None recommended by PT    Recommendations for Other Services  None  Frequency Min 3X/week   Plan Discharge plan remains appropriate;Frequency remains appropriate    Precautions / Restrictions Precautions Precautions: Fall   Pertinent Vitals/Pain VSS/no reports of pain    Mobility  Bed Mobility Bed Mobility: Rolling Right;Right Sidelying to Sit;Sitting - Scoot to [place] of Bed Rolling Right: 6: Modified independent (Device/Increase time) Right Sidelying to Sit: 6: Modified independent (Device/Increase time) Sitting - Scoot to Edge of Bed: 6: Modified independent (Device/Increase time) Details for Bed Mobility Assistance: with use of bed rail with right arm for transition to sit. Verbal cues with scooting to not use left arm Transfers Sit to Stand: 4: Min assist;With upper extremity assist;From bed;From chair/3-in-1 Stand to Sit: To chair/3-in-1;4: Min assist;With upper extremity assist;With armrests Details for Transfer Assistance: Patient requires verbal cues to minimize upper extremity initiation adn control with left arm. Ambulation/Gait Ambulation/Gait Assistance: 4: Min guard Ambulation Distance (Feet): 120 Feet Assistive device: Rolling walker Ambulation/Gait Assistance Details: Verbal cues for posture and improved step clearance as tendency for smaller strides and to look at feet resulting in trunk flexion and loss of balance anteriorly with stepping strategies to  correct.  Gait Pattern: Decreased stride length;Step-through pattern;Trunk flexed    Exercises Total Joint Exercises Towel Squeeze: AROM;Both;20 reps;Seated General Exercises - Lower Extremity Long Arc Quad: AROM;Both;20 reps;Seated Hip Flexion/Marching: AROM;Both;20 reps;Seated Toe Raises: AROM;Both;20 reps;Seated Heel Raises: AROM;Both;20 reps;Seated    PT Goals Acute Rehab PT Goals PT Goal: Supine/Side to Sit - Progress: Progressing toward goal PT Goal: Sit to Stand - Progress: Progressing toward goal PT Goal: Stand to Sit - Progress: Progressing toward goal PT Goal: Ambulate - Progress: Progressing toward goal  Visit Information  Last PT Received On: 05/01/12 Assistance Needed: +1    Subjective Data  Subjective: Patient reports feeling stronger daily Patient Stated Goal: Rehabilitate prior to home   Cognition  Overall Cognitive Status: Appears within functional limits for tasks assessed/performed Arousal/Alertness: Awake/alert Orientation Level: Appears intact for tasks assessed Behavior During Session: [facility] for tasks performed    Balance  Static Standing Balance Static Standing - Balance Support: No upper extremity supported Static Standing - Level of Assistance: 4: Min assist Static Standing - Comment/# of Minutes: Worked on postural control and midline positioning  End of Session PT - End of Session Equipment Utilized During Treatment: Gait belt Activity Tolerance: Patient tolerated treatment well Patient left: in chair;with chair alarm set;with call bell/phone within reach Nurse Communication: Mobility status    Edwyna Perfect, PT  Pager [phone]  05/01/2012, 11:46 AM

## 2012-05-01 NOTE — Progress Notes (Signed)
Subjective:  No chest pain.  Rhythm stable NSR with bifascicular block. Still very weak. Needs SNF CT of head shows resolution of subdural.  Objective:  Vital Signs in the last 24 hours: Temp:  [97.9 F (36.6 C)-98.8 F (37.1 C)] 97.9 F (36.6 C) (05/13 0500) Pulse Rate:  [57-70] 69  (05/13 0800) Resp:  [16-18] 16  (05/13 0500) BP: (107-174)/(54-93) 170/77 mmHg (05/13 0800) SpO2:  [95 %-99 %] 95 % (05/13 0500)  Intake/Output from previous day: 05/12 0701 - 05/13 0700 In: 240 [P.O.:240] Out: 1250 [Urine:1250] Intake/Output from this shift:       . amiodarone  200 mg Oral Daily  . aspirin EC  81 mg Oral Once  . carvedilol  12.5 mg Oral BID WC  . docusate sodium  100 mg Oral Daily  . ferrous sulfate  325 mg Oral QODAY  . furosemide  20 mg Oral Daily  . insulin aspart  0-15 Units Subcutaneous TID WC  . insulin aspart  0-5 Units Subcutaneous QHS  . isosorbide mononitrate  30 mg Oral BID  . losartan  100 mg Oral Daily  . pantoprazole  40 mg Oral Daily  . rosuvastatin  10 mg Oral Daily  . sodium chloride  3 mL Intravenous Q12H  . sodium chloride  3 mL Intravenous Q12H  . DISCONTD: insulin aspart  0-15 Units Subcutaneous TID WC      . sodium chloride      Physical Exam: The patient appears to be in no distress.  Head and neck exam reveals that the pupils are equal and reactive.  The extraocular movements are full.  There is no scleral icterus.  Mouth and pharynx are benign.  No lymphadenopathy.  No carotid bruits.  The jugular venous pressure is normal.  Thyroid is not enlarged or tender.  Chest is clear to percussion and auscultation.  No rales or rhonchi.  Expansion of the chest is symmetrical. Pacer site looks good.  Heart reveals no abnormal lift or heave.  First and second heart sounds are normal.  There is no murmur gallop rub or click.  The abdomen is soft and nontender.  Bowel sounds are normoactive.  There is no hepatosplenomegaly or mass.  There are no  abdominal bruits.  Extremities reveal no phlebitis or edema.  Pedal pulses are good.  There is no cyanosis or clubbing.  Neurologic exam is normal strength and no lateralizing weakness.  No sensory deficits.  Integument reveals no rash  Lab Results: No results found for this basename: WBC:2,HGB:2,PLT:2 in the last 72 hours No results found for this basename: NA:2,K:2,CL:2,CO2:2,GLUCOSE:2,BUN:2,CREATININE:2 in the last 72 hours No results found for this basename: TROPONINI:2,CK,MB:2 in the last 72 hours Hepatic Function Panel No results found for this basename: PROT,ALBUMIN,AST,ALT,ALKPHOS,BILITOT,BILIDIR,IBILI in the last 72 hours No results found for this basename: CHOL in the last 72 hours No results found for this basename: PROTIME in the last 72 hours  Imaging: Ct Head Wo Contrast  04/30/2012  *RADIOLOGY REPORT*  Clinical Data: Follow-up subarachnoid hematoma.  CT HEAD WITHOUT CONTRAST  Technique:  Contiguous axial images were obtained from the base of the skull through the vertex without contrast.  Comparison: CT head without contrast 04/07/2012.  Findings: The previously seen left frontal subarachnoid blood is no longer evident.  No acute cortical infarct, hemorrhage, mass lesion is present. Moderate generalized atrophy is stable.  No significant extra-axial fluid collection is present. The ventricles are proportionate to the degree of atrophy.  The paranasal  sinuses and mastoid air cells are clear.  The osseous skull is intact.  IMPRESSION:  1.  Interval resolution of subarachnoid blood. 2.  No acute intracranial abnormality or new hemorrhage. 3.  Stable atrophy.  Original Report Authenticated By: Jamesetta Orleans. MATTERN, M.D.    Cardiac Studies:  Assessment/Plan:  Patient Active Hospital Problem List: Wide-complex tachycardia (04/26/2012)   Assessment: Resolved  Tachy-brady syndrome (04/26/2012)   Assessment: Stable NSR   Plan:Pacer functioning normally Weakness and failure to  thrive   Plan: SNF.  His son concurs  LOS: 5 days    Benjamin Noble 05/01/2012, 8:48 AM

## 2012-05-01 NOTE — Progress Notes (Signed)
CSW reviewed patient chart and tlc, pt was unfortunately not faxed out to facilities. CSW refaxed patient out to [facility] and spoke with [facility] regarding bed offer. CSW awaiting bed offers and word from [facility] regarding pt bed offer.   .Clinical social worker continuing to follow pt to assist with pt dc plans and further csw needs.   Catha Gosselin, Theresia Majors  [phone] .05/01/2012 1058am

## 2012-05-02 ENCOUNTER — Other Ambulatory Visit: Payer: Medicare Other

## 2012-05-02 ENCOUNTER — Ambulatory Visit: Payer: Medicare Other | Admitting: Nurse Practitioner

## 2012-05-02 LAB — GLUCOSE, CAPILLARY

## 2012-05-02 MED ORDER — CARVEDILOL 25 MG PO TABS
25.0000 mg | ORAL_TABLET | Freq: Two times a day (BID) | ORAL | Status: DC
  Filled 2012-05-02 (×3): qty 1

## 2012-05-02 MED ORDER — INSULIN GLARGINE 100 UNIT/ML ~~LOC~~ SOLN: 7.0000 [IU] | Freq: Every day | SUBCUTANEOUS | Status: DC

## 2012-05-02 MED ORDER — INSULIN GLARGINE 100 UNIT/ML ~~LOC~~ SOLN: 10.0000 [IU] | Freq: Every day | SUBCUTANEOUS | Status: DC

## 2012-05-02 MED ORDER — CARVEDILOL 25 MG PO TABS: 25.0000 mg | ORAL_TABLET | Freq: Two times a day (BID) | ORAL | Status: DC

## 2012-05-02 MED ORDER — LOSARTAN POTASSIUM 100 MG PO TABS: 100.0000 mg | ORAL_TABLET | Freq: Every day | ORAL | Status: DC

## 2012-05-02 MED ORDER — CARVEDILOL 12.5 MG PO TABS
12.5000 mg | ORAL_TABLET | Freq: Once | ORAL | Status: AC
  Administered 2012-05-02: 12.5 mg via ORAL
  Filled 2012-05-02: qty 1

## 2012-05-02 MED ORDER — CARVEDILOL 25 MG PO TABS
25.0000 mg | ORAL_TABLET | Freq: Two times a day (BID) | ORAL | Status: DC
  Filled 2012-05-02 (×2): qty 1

## 2012-05-02 MED ORDER — ASPIRIN EC 81 MG PO TBEC: 81.0000 mg | DELAYED_RELEASE_TABLET | Freq: Every day | ORAL | Status: DC

## 2012-05-02 MED ORDER — ROSUVASTATIN CALCIUM 10 MG PO TABS: 10.0000 mg | ORAL_TABLET | Freq: Every day | ORAL | Status: DC

## 2012-05-02 MED ORDER — AMIODARONE HCL 200 MG PO TABS: 200.0000 mg | ORAL_TABLET | Freq: Every day | ORAL | Status: DC

## 2012-05-02 NOTE — Discharge Summary (Signed)
CARDIOLOGY DISCHARGE SUMMARY   Patient ID: Benjamin Noble MRN: [id number] DOB/AGE: October 07, 1922 78 y.o.  Admit date: 04/26/2012 Discharge date: 05/02/2012  Primary Discharge Diagnosis:  Wide complex tachycardia with tachybradycardia syndrome Secondary Discharge Diagnosis:  Past Medical History  Diagnosis Date  . Ischemic heart disease     original CABG in 1984 with redo CABG in 1992; managed medically  . HTN (hypertension)   . Angina pectoris   . Hypercholesterolemia   . CHF (congestive heart failure)     EF is 35 to 40% per echo April 2013  . Arthritis   . Urinary incontinence   . Myocardial infarction   . Dysrhythmia     irregular  heart beats per patient  . Asthma   . Shortness of breath   . Diabetes mellitus     type 2  . GERD (gastroesophageal reflux disease)   . Neuromuscular disorder     periferal neruropathy  . Depression   . Syncope   . Subarachnoid hemorrhage April 2013    from fall/coumadin & aspirin stopped    Consults: Electrophysiology, physical therapy, occupational therapy  Procedures: Insertion of a Medtronic Cynthia dual-chamber pacemaker, CT of the head without contrast, 2-D echocardiogram, two-view chest x-ray  Hospital Course: Benjamin Noble is an 78 year old male with a history of coronary artery disease, managed medically. He had chest pain and tachycardia palpitations. He was brought to the hospital where he was in a wide complex tachycardia, heart rate in the 150s. He was symptomatic with this. He was given amiodarone and a beta blocker to convert him but his heart rate would drop into the 20s with a junctional rhythm. He was admitted for further evaluation and treatment.  His rhythm was SVT and converted with adenosine. Amiodarone was also used to help prevent recurrence. An electrophysiology consult was called and it was determined that a pacemaker was indicated. He had a Medtronic Cynthia dual-chamber pacemaker inserted without immediate  complication. A followup chest x-ray showed no problems. The device was functioning normally. His cardiac enzymes were cycled and he ruled in with a non-STEMI. This was felt to be a type II secondary to his elevated heart rate and medical therapy was indicated. He had no anginal symptoms. A 2-D echocardiogram showed an EF of 35-40%. He was euvolemic by exam and was continued on his usual medications. Once the pacemaker was in place and confirmed to be functioning normally, his beta blocker was restarted.  He was having problems with leg weakness and ambulation. He was using a walker prior to admission at times. He was seen by physical therapy and felt to be unsteady and generally weak. Home health PT and 24-hour assist was recommended. He was seen by occupational therapy as well. They recommended skilled nursing facility placement for rehabilitation prior to returning home. This was discussed with the patient and his son who were in agreement. He was seen by clinical social work to make arrangements.  His beta blocker was increased for better blood pressure control. He was felt to have some weakness and failure to thrive and awaited nursing home placement. The pacemaker continued to function normally. His blood sugars were elevated and Lantus was added to help with CBG control.  On 05/02/2012, Benjamin Noble was medically stable for discharge to [facility] for rehabilitation. Cardiology followup has been arranged.  Labs:   Lab Results  Component Value Date   WBC 7.2 04/26/2012   HGB 13.9 04/26/2012   HCT 41.0 04/26/2012  MCV 90.3 04/26/2012   PLT 230 04/26/2012    Lab 04/27/12 0737 04/26/12 0524  NA 133* --  K 4.3 --  CL 95* --  CO2 26 --  BUN 21 --  CREATININE 1.10 --  CALCIUM 8.8 --  PROT -- 7.0  BILITOT -- 0.4  ALKPHOS -- 107  ALT -- 10  AST -- 13  GLUCOSE 294* --      Radiology: Dg Chest 2 View 04/28/2012  *RADIOLOGY REPORT*  Clinical Data: Post pacemaker insertion.  Shortness of breath.  CHEST - 2 VIEW  Comparison: 04/26/2012.  Findings: .  Sequential pacemaker placed with leads in the region of the right atrium and right ventricle without complication noted. No pneumothorax.  Fractured sternal wires unchanged.  Left costophrenic angle pleural calcifications stable.  Heart size top normal.  Mild central pulmonary vascular prominence. No frank pulmonary edema or segmental infiltrate.  Calcified aorta.  IMPRESSION: Post placement of sequential pacemaker from the left without complication noted.  Please see above.  Original Report Authenticated By: Fuller Canada, M.D.   Ct Head Wo Contrast 04/30/2012  *RADIOLOGY REPORT*  Clinical Data: Follow-up subarachnoid hematoma.  CT HEAD WITHOUT CONTRAST  Technique:  Contiguous axial images were obtained from the base of the skull through the vertex without contrast.  Comparison: CT head without contrast 04/07/2012.  Findings: The previously seen left frontal subarachnoid blood is no longer evident.  No acute cortical infarct, hemorrhage, mass lesion is present. Moderate generalized atrophy is stable.  No significant extra-axial fluid collection is present. The ventricles are proportionate to the degree of atrophy.  The paranasal sinuses and mastoid air cells are clear.  The osseous skull is intact.  IMPRESSION:  1.  Interval resolution of subarachnoid blood. 2.  No acute intracranial abnormality or new hemorrhage. 3.  Stable atrophy.  Original Report Authenticated By: Jamesetta Orleans. MATTERN, M.D.   Dg Chest Portable 1 View 04/26/2012  *RADIOLOGY REPORT*  Clinical Data: Chest pain.  PORTABLE CHEST - 1 VIEW  Comparison: Chest radiograph performed 04/05/2012  Findings: The lungs are well-aerated.  Mild bibasilar airspace opacities likely reflect atelectasis or scarring.  There is no evidence of pleural effusion or pneumothorax.  The cardiomediastinal silhouette is borderline normal in size; the patient is status post median sternotomy.   There are fractures of all of the visualized sternal wires.  No acute osseous abnormalities are seen.  IMPRESSION: Mild bibasilar airspace opacities may reflect atelectasis or scarring; no definite superimposed airspace consolidation seen.  Original Report Authenticated By: Tonia Ghent, M.D.   EKG: 28-Apr-2012 04:09:25  Normal sinus rhythm with sinus arrhythmia Right bundle branch block , plus right ventricular hypertrophy Septal infarct , age undetermined Possible Lateral infarct , age undetermined Inferior infarct , age undetermined Vent. rate 85 BPM PR interval 180 ms QRS duration 166 ms QT/QTc 466/554 ms P-R-T axes -25 269 68  Echo: 04/27/2012 Study Conclusions - Left ventricle: The cavity size was normal. Wall thickness was normal. Systolic function was severely reduced. The estimated ejection fraction was in the range of 25% to 30%. The posterior wall and apex were akinetic. The inferior and anterolateral walls were severely hypokinetic. Doppler parameters are consistent with restrictive physiology, indicative of decreased left ventricular diastolic compliance and/or increased left atrial pressure. E/medial e' > 15 suggests LV end diastolic pressure at least 20 mmHg. - Aortic valve: Sclerosis without stenosis. - Mitral valve: No significant regurgitation. - Left atrium: The atrium was moderately dilated. - Right ventricle: Poorly visualized.  The cavity size was normal. Systolic function was mildly reduced. - Tricuspid valve: Peak RV-RA gradient: 39mm Hg (S). - Pulmonary arteries: PA peak pressure: 49mm Hg (S). - Systemic veins: IVC not visualized. Impressions: - Normal LV size with EF 25-30%. The posterior wall and apex were akinetic. The inferior wall and anterolateral wall were severely hypokinetic. There was severe diastolic dysfunction with evidence for elevated LV filling pressure. The RV was poorly visualized but appeared normal in size with at least mild systolic  dysfunction.     FOLLOW UP PLANS AND APPOINTMENTS Discharge Orders    Future Appointments: Provider: Department: Dept Phone: Center:   05/08/2012 3:00 PM [facility] [phone] [facility]   05/29/2012 2:00 PM Cassell Clement, MD [facility] [phone] None   07/28/2012 10:30 AM Marinus Maw, MD [facility] [phone] [facility]     Allergies  Allergen Reactions  . Penicillins Anaphylaxis and Swelling  . Lipitor (Atorvastatin Calcium) Other (See Comments)    Gi issues  . Macrodantin Other (See Comments)    Unknown   . Morphine And Related Other (See Comments)    "doesn't feel right"  . Ranolazine Er Other (See Comments)    constipation  . Statins Other (See Comments)    Leg weakness   Medication List  As of 05/02/2012 10:29 AM   STOP taking these medications         metoprolol tartrate 25 MG tablet         TAKE these medications         amiodarone 200 MG tablet   Commonly known as: PACERONE   Take 1 tablet (200 mg total) by mouth daily.      aspirin EC 81 MG tablet   Take 81 mg by mouth once. Chest pain      carvedilol 25 MG tablet   Commonly known as: COREG   Take 1 tablet (25 mg total) by mouth 2 (two) times daily with a meal.      docusate sodium 100 MG capsule   Commonly known as: COLACE   Take 100 mg by mouth daily.      ferrous sulfate 325 (65 FE) MG tablet   Take 325 mg by mouth every other day.      furosemide 20 MG tablet   Commonly known as: LASIX   Take 1 tablet (20 mg total) by mouth daily.      insulin glargine 100 UNIT/ML injection   Commonly known as: LANTUS   Inject 7 Units into the skin at bedtime.      isosorbide mononitrate 30 MG 24 hr tablet   Commonly known as: IMDUR   Take 1 tablet (30 mg total) by mouth 2 (two) times daily.      losartan 100 MG tablet   Commonly known as: COZAAR   Take 1 tablet (100 mg total) by mouth daily.      nitroGLYCERIN 0.4 MG SL tablet   Commonly  known as: NITROSTAT   Place 0.4 mg under the tongue every 5 (five) minutes as needed. For chest pain.      pantoprazole 40 MG tablet   Commonly known as: PROTONIX   Take 40 mg by mouth daily.      rosuvastatin 10 MG tablet   Commonly known as: CRESTOR   Take 1 tablet (10 mg total) by mouth daily.            Follow-up Information    Follow up with  [facility] on 05/08/2012. (At 3:00 PM for wound check)    Contact information:   [address]  [address] [place] [address] [phone]      Follow up with [name], MD on 07/28/2012. (At 10:30 AM)    Contact information:   [address]  [address] [place] [address] [phone]          BRING ALL MEDICATIONS WITH YOU TO FOLLOW UP APPOINTMENTS  Time spent with patient to include physician time: 48 min Signed: Theodore Demark 05/02/2012, 9:44 AM Co-Sign MD

## 2012-05-02 NOTE — Progress Notes (Signed)
.  Clinical social worker assisted with patient discharge to skilled nursing facility, [facility]. .Patient transportation provided by [facility] [facility] with patient chart copy. Pt transportation scheduled for 2pm. .No further Clinical Social Work needs, signing off.   Catha Gosselin, Theresia Majors  [phone] .05/02/2012 13:35pm

## 2012-05-02 NOTE — Progress Notes (Signed)
Subjective:  No chest pain.  Rhythm stable NSR with bifascicular block. BP still running high. Will increase carvedilol to 25 mg BID CT of head shows resolution of subdural.  Objective:  Vital Signs in the last 24 hours: Temp:  [97.4 F (36.3 C)-99 F (37.2 C)] 97.4 F (36.3 C) (05/14 0500) Pulse Rate:  [60-76] 76  (05/14 0826) Resp:  [17-18] 18  (05/14 0500) BP: (111-195)/(61-104) 159/90 mmHg (05/14 0826) SpO2:  [96 %-97 %] 96 % (05/14 0500)  Intake/Output from previous day: 05/13 0701 - 05/14 0700 In: 966 [P.O.:960; I.V.:6] Out: 925 [Urine:925] Intake/Output from this shift:       . amiodarone  200 mg Oral Daily  . aspirin EC  81 mg Oral Once  . carvedilol  25 mg Oral BID WC  . docusate sodium  100 mg Oral Daily  . ferrous sulfate  325 mg Oral QODAY  . furosemide  20 mg Oral Daily  . insulin aspart  0-15 Units Subcutaneous TID WC  . insulin aspart  0-5 Units Subcutaneous QHS  . isosorbide mononitrate  30 mg Oral BID  . losartan  100 mg Oral Daily  . pantoprazole  40 mg Oral Daily  . rosuvastatin  10 mg Oral Daily  . sodium chloride  3 mL Intravenous Q12H  . sodium chloride  3 mL Intravenous Q12H  . DISCONTD: carvedilol  12.5 mg Oral BID WC      . sodium chloride      Physical Exam: The patient appears to be in no distress.  Head and neck exam reveals that the pupils are equal and reactive.  The extraocular movements are full.  There is no scleral icterus.  Mouth and pharynx are benign.  No lymphadenopathy.  No carotid bruits.  The jugular venous pressure is normal.  Thyroid is not enlarged or tender.  Chest is clear to percussion and auscultation.  No rales or rhonchi.  Expansion of the chest is symmetrical. Pacer site looks good.  Heart reveals no abnormal lift or heave.  First and second heart sounds are normal.  There is no murmur gallop rub or click.  The abdomen is soft and nontender.  Bowel sounds are normoactive.  There is no hepatosplenomegaly or  mass.  There are no abdominal bruits.  Extremities reveal no phlebitis or edema.  Pedal pulses are good.  There is no cyanosis or clubbing.  Neurologic exam is normal strength and no lateralizing weakness.  No sensory deficits.  Integument reveals no rash  Lab Results: No results found for this basename: WBC:2,HGB:2,PLT:2 in the last 72 hours No results found for this basename: NA:2,K:2,CL:2,CO2:2,GLUCOSE:2,BUN:2,CREATININE:2 in the last 72 hours No results found for this basename: TROPONINI:2,CK,MB:2 in the last 72 hours Hepatic Function Panel No results found for this basename: PROT,ALBUMIN,AST,ALT,ALKPHOS,BILITOT,BILIDIR,IBILI in the last 72 hours No results found for this basename: CHOL in the last 72 hours No results found for this basename: PROTIME in the last 72 hours  Imaging: Ct Head Wo Contrast  04/30/2012  *RADIOLOGY REPORT*  Clinical Data: Follow-up subarachnoid hematoma.  CT HEAD WITHOUT CONTRAST  Technique:  Contiguous axial images were obtained from the base of the skull through the vertex without contrast.  Comparison: CT head without contrast 04/07/2012.  Findings: The previously seen left frontal subarachnoid blood is no longer evident.  No acute cortical infarct, hemorrhage, mass lesion is present. Moderate generalized atrophy is stable.  No significant extra-axial fluid collection is present. The ventricles are proportionate to the  degree of atrophy.  The paranasal sinuses and mastoid air cells are clear.  The osseous skull is intact.  IMPRESSION:  1.  Interval resolution of subarachnoid blood. 2.  No acute intracranial abnormality or new hemorrhage. 3.  Stable atrophy.  Original Report Authenticated By: Jamesetta Orleans. MATTERN, M.D.    Cardiac Studies:  Assessment/Plan:  Patient Active Hospital Problem List: Wide-complex tachycardia (04/26/2012)   Assessment: Resolved  Tachy-brady syndrome (04/26/2012)   Assessment: Stable NSR   Plan:Pacer functioning normally Weakness  and failure to thrive   Plan: SNF.  OK to transfer to SNF today.  LOS: 6 days    Cassell Clement 05/02/2012, 8:37 AM

## 2012-05-02 NOTE — Discharge Instructions (Signed)
[facility] [phone] rn,pt,ot  Weigh daily  Physical Therapy and Occupational Therapy 3 x week     Supplemental Discharge Instructions for  Pacemaker/Defibrillator Patients  Activity No heavy lifting or vigorous activity with your left/right arm for 6 to 8 weeks.  Do not raise your left/right arm above your head for one week.  Gradually raise your affected arm as drawn below.                       5/14                   5/15                      5/16                        5/17   NO DRIVING till cleared by MD  WOUND CARE   Keep the wound area clean and dry.  Do not get this area wet for one week. No showers for one week; you may shower on      5/18        .   The tape/steri-strips on your wound will fall off; do not pull them off.  No bandage is needed on the site.  DO  NOT apply any creams, oils, or ointments to the wound area.   If you notice any drainage or discharge from the wound, any swelling or bruising at the site, or you develop a fever > 101? F after you are discharged home, call the office at once.  Special Instructions   You are still able to use cellular telephones; use the ear opposite the side where you have your pacemaker/defibrillator.  Avoid carrying your cellular phone near your device.   When traveling through airports, show security personnel your identification card to avoid being screened in the metal detectors.  Ask the security personnel to use the hand wand.   Avoid arc welding equipment, MRI testing (magnetic resonance imaging), TENS units (transcutaneous nerve stimulators).  Call the office for questions about other devices.   Avoid electrical appliances that are in poor condition or are not properly grounded.   Microwave ovens are safe to be near or to operate.  Additional information for defibrillator patients should your device go off:   If your device goes off ONCE and you feel fine afterward, notify the device clinic nurses.   If your device  goes off ONCE and you do not feel well afterward, call 911.   If your device goes off TWICE, call 911.   If your device goes off THREE times in one day, call 911.  DO NOT DRIVE YOURSELF OR A FAMILY MEMBER WITH A DEFIBRILLATOR TO THE HOSPITAL--CALL 911.

## 2012-05-02 NOTE — Progress Notes (Signed)
D/c orders received;IV removed with gauze on, pt remains in stable condition; pt meds and instructions reviewed and given to pt; pt d/c to SNF, taken via ambulance

## 2012-05-02 NOTE — Progress Notes (Signed)
Physical Therapy Treatment Patient Details Name: Tobie Hellen MRN: [id number] DOB: [birth year] Today's Date: 05/02/2012 Time: 4098-1191 PT Time Calculation (min): 26 min  PT Assessment / Plan / Recommendation Comments on Treatment Session  Patient continues to improve with his functional mobility, but needs work on his safety and balance to prevent future falls    Follow Up Recommendations  Home health PT;Supervision/Assistance - 24 hour    Barriers to Discharge  None      Equipment Recommendations  None recommended by PT    Recommendations for Other Services  None  Frequency Min 3X/week   Plan Discharge plan remains appropriate;Frequency remains appropriate    Precautions / Restrictions Precautions Precautions: Fall   Pertinent Vitals/Pain VSS/no pain reported    Mobility  Bed Mobility Details for Bed Mobility Assistance: Up in chair therefore N/T Transfers Sit to Stand: 4: Min assist;With upper extremity assist;From chair/3-in-1;With armrests Stand to Sit: 4: Min assist;With armrests;With upper extremity assist;To chair/3-in-1 Details for Transfer Assistance: Worked on improved initiation with decreased upper extremity reliance particularly the left. Practiced up to 5 x from chair. Also worked on posture and anterior weight shift to prevent initial posterior bias and on control of descent particularly the latter 1/4.  Ambulation/Gait Ambulation/Gait Assistance: 4: Min guard Ambulation Distance (Feet): 220 Feet Assistive device: Rolling walker Ambulation/Gait Assistance Details: Improved posture today. Patient able to verbalize all strategies for improved gait, but does not consistently follow through with gait as tendency to step outof walker while turning to sit in chair and sit prematurely.  Gait Pattern: Decreased stride length;Trunk flexed;Shuffle    Exercises General Exercises - Lower Extremity Long Arc Quad: AROM;Both;20 reps;Seated (with emphasis on control  of lowering) Hip Flexion/Marching: AROM;Both;20 reps;Standing Toe Raises: AROM;Both;20 reps;Standing Mini-Sqauts: AROM;Both;10 reps;Standing    PT Goals Acute Rehab PT Goals PT Goal: Sit to Stand - Progress: Progressing toward goal PT Goal: Stand to Sit - Progress: Progressing toward goal PT Goal: Ambulate - Progress: Progressing toward goal  Visit Information  Last PT Received On: 05/02/12 Assistance Needed: +1    Subjective Data  Subjective: Patient reports no dizziness or pain Patient Stated Goal: Home soon   Cognition  Overall Cognitive Status: Appears within functional limits for tasks assessed/performed Arousal/Alertness: Awake/alert Orientation Level: Appears intact for tasks assessed Behavior During Session: [facility] for tasks performed    Balance   Posterior bias with initial stand, but with dynamic activity increasing trunk flexion leads to anterior bias.  End of Session PT - End of Session Equipment Utilized During Treatment: Gait belt Activity Tolerance: Patient tolerated treatment well Patient left: in chair;with chair alarm set;with call bell/phone within reach Nurse Communication: Mobility status    Edwyna Perfect, PT  Pager [phone]  05/02/2012, 10:31 AM

## 2012-05-03 ENCOUNTER — Telehealth: Payer: Self-pay | Admitting: Cardiology

## 2012-05-03 NOTE — Telephone Encounter (Signed)
Left message to call back  

## 2012-05-03 NOTE — Telephone Encounter (Signed)
Please return call to patient son Amada Jupiter at [phone], he has questions about documents as well as Dr. Jules Husbands at the [facility].

## 2012-05-04 NOTE — Telephone Encounter (Signed)
Fu call Pt's son called about getting his prescriptions faxed to [facility] Dr Jules Husbands.

## 2012-05-04 NOTE — Telephone Encounter (Signed)
New msg Pt's son said that he is nursing home and wont be able to bring in for wound check

## 2012-05-04 NOTE — Telephone Encounter (Signed)
Spoke with son and pt not able to walk or stand at this time. Pt is at [facility] for therapy. Son to call next week and give status on how pt is doing. Site has been checked by facility and was told site was healing. Will wait for call back from son.

## 2012-05-05 ENCOUNTER — Telehealth: Payer: Self-pay | Admitting: Cardiology

## 2012-05-05 NOTE — Telephone Encounter (Signed)
Follow-up:     Patient's son in calling back to see the status of his FMLA paperwork for his father.  Please call back.

## 2012-05-08 ENCOUNTER — Ambulatory Visit: Payer: Medicare Other

## 2012-05-08 NOTE — Telephone Encounter (Signed)
Will fill out paper work and have  Dr. Patty Sermons sign when back in the office

## 2012-05-18 ENCOUNTER — Telehealth: Payer: Self-pay | Admitting: Cardiology

## 2012-05-18 NOTE — Telephone Encounter (Signed)
F/u   Patient son Amada Jupiter called for update on previous request, he can be reached at [phone]  @ 5:30pm.

## 2012-05-18 NOTE — Telephone Encounter (Signed)
Please return call to patient son dale [phone] concerning   - concerning FMLA papers   -[facility] hardcopies   -Medication changes

## 2012-05-18 NOTE — Telephone Encounter (Signed)
Left message to call back  

## 2012-05-19 ENCOUNTER — Telehealth: Payer: Self-pay | Admitting: *Deleted

## 2012-05-19 MED ORDER — FENOFIBRATE 48 MG PO TABS: 48.0000 mg | ORAL_TABLET | Freq: Every day | ORAL | Status: DC

## 2012-05-19 NOTE — Telephone Encounter (Signed)
Left msg his dad can stop crestor, new script was sent to pharmacy for fenofibrate . Asked him to call back if hard script needed for [place].

## 2012-05-19 NOTE — Telephone Encounter (Signed)
Per son patient d/c home on a statin and he already has leg weakness and doesn't tolerate statins.  Son takes a fenofibrate drug and wants to know if this would be an option.  Will forward to  Dr. Patty Sermons for review

## 2012-05-19 NOTE — Telephone Encounter (Signed)
Stopped statin and switch to fenofibrate 48 mg one daily

## 2012-05-20 HISTORY — PX: INSERT / REPLACE / REMOVE PACEMAKER: SUR710

## 2012-05-23 ENCOUNTER — Telehealth: Payer: Self-pay | Admitting: Cardiology

## 2012-05-23 DIAGNOSIS — E039 Hypothyroidism, unspecified: Secondary | ICD-10-CM

## 2012-05-23 NOTE — Telephone Encounter (Signed)
Pt was released from [facility] today , needs to talk to [facility] tomorrow re meds

## 2012-05-24 MED ORDER — LEVOTHYROXINE SODIUM 25 MCG PO TABS: 25.0000 ug | ORAL_TABLET | Freq: Every day | ORAL | Status: DC

## 2012-05-24 NOTE — Telephone Encounter (Signed)
What is her current dose of insulin and what thyroid dose?

## 2012-05-24 NOTE — Telephone Encounter (Signed)
Patient discharged from [facility] (family very unhappy with care and no confidence in treatment) on sliding scale and on thyroid medication.  In addition to the sliding scale, patient also take Lantus at bedtime.  Patient was not eating well and back at home where the diet can be followed more closely.  Will forward to  Dr. Patty Sermons

## 2012-05-24 NOTE — Telephone Encounter (Signed)
Patient discharged from [facility] on Humalog a couple of times a day and thyroid medications.  Patient was under increased stress at facility and didn't do well. Back home now and daughter is concerned with patient being on sliding scale.  Has appointment on June 10 with  Dr. Patty Sermons. Daughter wants to know what to do regarding medications.  Will forward to  Dr. Patty Sermons for review.

## 2012-05-24 NOTE — Telephone Encounter (Signed)
Advised daughter.  Filled Synthroid since d/c from facility without any.

## 2012-05-24 NOTE — Telephone Encounter (Signed)
Left message to call back  

## 2012-05-24 NOTE — Telephone Encounter (Signed)
Okay to take the thyroid medicine. For the diabetes let us stop the sliding scale and just use Lantus insulin 10 u daily.  We can adjust the dose upward if blood sugars remain high at home.

## 2012-05-29 ENCOUNTER — Ambulatory Visit (INDEPENDENT_AMBULATORY_CARE_PROVIDER_SITE_OTHER): Payer: Medicare Other | Admitting: Cardiology

## 2012-05-29 ENCOUNTER — Encounter: Payer: Self-pay | Admitting: Cardiology

## 2012-05-29 VITALS — BP 100/70 | HR 60 | Ht 69.0 in | Wt 168.0 lb

## 2012-05-29 DIAGNOSIS — E119 Type 2 diabetes mellitus without complications: Secondary | ICD-10-CM

## 2012-05-29 DIAGNOSIS — Z8679 Personal history of other diseases of the circulatory system: Secondary | ICD-10-CM

## 2012-05-29 DIAGNOSIS — E7800 Pure hypercholesterolemia, unspecified: Secondary | ICD-10-CM

## 2012-05-29 DIAGNOSIS — I495 Sick sinus syndrome: Secondary | ICD-10-CM

## 2012-05-29 DIAGNOSIS — E032 Hypothyroidism due to medicaments and other exogenous substances: Secondary | ICD-10-CM

## 2012-05-29 NOTE — Progress Notes (Signed)
Benjamin Noble Date of Birth:  06/26/1922 [facility] [address] [place], [place]  [address] [phone]         Fax   [phone]  History of Present Illness: This pleasant 78 year old gentleman is seen for a scheduled 3 month followup office visit.  The patient has a history of ischemic heart disease.  He has had coronary artery bypass graft surgery twice, most recently in 1992 which was a redo.  He has chronic angina that is managed medically.  He has LV dysfunction hypertension diabetes mellitus and GERD.  He was recently found to have prolonged pauses on Holter monitoring and socially underwent a pacemaker implantation successfully.  Current Outpatient Prescriptions  Medication Sig Dispense Refill  . amiodarone (PACERONE) 200 MG tablet Take 1 tablet (200 mg total) by mouth daily.  30 tablet  11  . aspirin EC 81 MG tablet Take 1 tablet (81 mg total) by mouth daily. Chest pain      . carvedilol (COREG) 25 MG tablet Take 1 tablet (25 mg total) by mouth 2 (two) times daily with a meal.  60 tablet  11  . docusate sodium (COLACE) 100 MG capsule Take 100 mg by mouth daily.       . fenofibrate (TRICOR) 48 MG tablet Take 1 tablet (48 mg total) by mouth daily.  30 tablet  5  . ferrous sulfate 325 (65 FE) MG tablet Take 325 mg by mouth every other day.       . furosemide (LASIX) 20 MG tablet Take 1 tablet (20 mg total) by mouth daily.  30 tablet  1  . insulin glargine (LANTUS) 100 UNIT/ML injection Inject 14 Units into the skin at bedtime.       . isosorbide mononitrate (IMDUR) 30 MG 24 hr tablet Take 1 tablet (30 mg total) by mouth 2 (two) times daily.  60 tablet  1  . levothyroxine (LEVOTHROID) 25 MCG tablet Take 1 tablet (25 mcg total) by mouth daily.  30 tablet  3  . losartan (COZAAR) 100 MG tablet Take 1 tablet (100 mg total) by mouth daily.  30 tablet  11  . nitroGLYCERIN (NITROSTAT) 0.4 MG SL tablet Place 0.4 mg under the tongue every 5 (five) minutes as  needed. For chest pain.      . pantoprazole (PROTONIX) 40 MG tablet Take 40 mg by mouth daily.          Allergies  Allergen Reactions  . Penicillins Anaphylaxis and Swelling  . Lipitor (Atorvastatin Calcium) Other (See Comments)    Gi issues  . Macrodantin Other (See Comments)    Unknown   . Morphine And Related Other (See Comments)    "doesn't feel right"  . Ranolazine Er Other (See Comments)    constipation  . Statins Other (See Comments)    Leg weakness    Patient Active Problem List  Diagnoses  . Hypertension  . S/P CABG (coronary artery bypass graft)  . Diabetes mellitus type II  . Hypercholesterolemia  . Anxiety associated with depression  . Angina pectoris  . Syncope  . Subarachnoid hemorrhage  . CKD (chronic kidney disease), stage II  . CAD (coronary artery disease)  . Bundle branch block  . Wide-complex tachycardia  . Tachy-brady syndrome  . Acute myocardial infarction, subendocardial infarction, initial episode of care  . Pacemaker-[employer]onic    History  Smoking status  . Never Smoker   Smokeless tobacco  . Never Used    History  Alcohol Use No    Family History  Problem Relation Age of Onset  . Heart attack Father     Review of Systems: Constitutional: no fever chills diaphoresis or fatigue or change in weight.  Head and neck: no hearing loss, no epistaxis, no photophobia or visual disturbance. Respiratory: No cough, shortness of breath or wheezing. Cardiovascular: No chest pain peripheral edema, palpitations. Gastrointestinal: No abdominal distention, no abdominal pain, no change in bowel habits hematochezia or melena. Genitourinary: No dysuria, no frequency, no urgency, no nocturia. Musculoskeletal:No arthralgias, no back pain, no gait disturbance or myalgias. Neurological: No dizziness, no headaches, no numbness, no seizures, no syncope, no weakness, no tremors. Hematologic: No lymphadenopathy, no easy bruising. Psychiatric: No confusion,  no hallucinations, no sleep disturbance.    Physical Exam: Filed Vitals:   05/29/12 1357  BP: 100/70  Pulse: 60   the general appearance reveals a well-developed well-nourished gentleman who is elderly but pleasantly confused.Pupils equal and reactive.   Extraocular Movements are full.  There is no scleral icterus.  The mouth and pharynx are normal.  The neck is supple.  The carotids reveal no bruits.  The jugular venous pressure is normal.  The thyroid is not enlarged.  There is no lymphadenopathy.  The chest is clear to percussion and auscultation. There are no rales or rhonchi. Expansion of the chest is symmetrical.  The precordium is quiet.  The first heart sound is normal.  The second heart sound is physiologically split.  There is no murmur gallop rub or click.  There is no abnormal lift or heave.  The abdomen is soft and nontender. Bowel sounds are normal. The liver and spleen are not enlarged. There Are no abdominal masses. There are no bruits.  The pedal pulses are good.  There is no phlebitis or edema.  There is no cyanosis or clubbing.  EKG shows AV paced rhythm   Assessment / Plan:  Continue on same medication.  Recheck in 3 months for followup office visit lipid panel hepatic function panel nasal metabolic panel CBC, TSH, free T4, and A1c

## 2012-05-29 NOTE — Assessment & Plan Note (Signed)
The patient has not been expressing any increase in his chest pain or angina pectoris.

## 2012-05-29 NOTE — Assessment & Plan Note (Signed)
Patient has tachybradycardia syndrome with prolonged pauses when he would convert back to sinus rhythm from atrial fib.  Since the placement of the pacemaker he has had no further syncope.  Her echocardiogram today shows AV pacing

## 2012-05-29 NOTE — Assessment & Plan Note (Signed)
The patient has a history of diabetes mellitus.  Blood sugars at home are ranging from 190 in the morning to 230 in the afternoon.  Presently is on no oral medication and he is on Lantus 10 units each evening.  We will increase his Lantus to 14 units at bedtime

## 2012-05-29 NOTE — Patient Instructions (Signed)
Increase your Lantus to 14 units at bedtime, if blood sugars remain high at home call   Your physician recommends that you schedule a follow-up appointment in: 3 months with fasting labs (LP/BMET/HFP/TSH/T4/CBC/A1C)

## 2012-05-30 NOTE — Telephone Encounter (Signed)
New problem:  Per Brook from [facility], wanted to report an fall x 2 . Son was there. Not on coumadin anymore. No injury &  bruise.

## 2012-05-30 NOTE — Telephone Encounter (Signed)
Per Brook, vital signs WNL.  She stated patient thinks he just had a big day yesterday with coming out to see  Dr. Patty Sermons.  Debbe Bales will be going out again on Thursday to evaluate patient, will call if any changes

## 2012-05-30 NOTE — Telephone Encounter (Signed)
OK 

## 2012-05-31 ENCOUNTER — Telehealth: Payer: Self-pay | Admitting: Cardiology

## 2012-05-31 NOTE — Telephone Encounter (Signed)
[facility]  [phone]  Pt fell 2 x Monday night, Patient fell twice Tuesday night, Patient fell once today Wednesday.

## 2012-05-31 NOTE — Telephone Encounter (Signed)
Did get a refill request from pharmacy on patients Xanax.  Dr. Patty Sermons denied secondary to falls

## 2012-05-31 NOTE — Telephone Encounter (Signed)
Left message to call back  

## 2012-06-01 ENCOUNTER — Telehealth: Payer: Self-pay | Admitting: Cardiology

## 2012-06-01 NOTE — Telephone Encounter (Signed)
Follow up on previous call:  Patient returning call back to [facility] aware she is not - office today .

## 2012-06-01 NOTE — Telephone Encounter (Signed)
Please return call to patient daughter Kirtland Bouchard [phone] regarding patient medication making him feel jitters.

## 2012-06-01 NOTE — Telephone Encounter (Signed)
Per daughter - pt has been complaining of increased fatigue, no energy, no appetite and feeling "blah"  She says he told her today that he felt like he is having a reaction to one of his medications.  He has been on these same meds for over 4 weeks now.  She does report that he has been refusing his PT when they come and she has been trying to encourage him to increase his activity levels as she know his inactivity is making him weaker.  She states all he wants to do is sleep.  She is aware Dr Patty Sermons nor Juliette Alcide are here today but this information will be forwarded to them for review and orders.

## 2012-06-02 NOTE — Telephone Encounter (Signed)
Continue to avoid Xanax.

## 2012-06-02 NOTE — Telephone Encounter (Signed)
Agree with plan 

## 2012-06-02 NOTE — Telephone Encounter (Signed)
Pt's son calling re needing rx for xanax, states when pt fell he had not been given this med, that the dizziness and falling is due to his other meds, uses [facility], pt's son [phone]

## 2012-06-02 NOTE — Telephone Encounter (Signed)
Fu call Pt's daughter called to say she got your message

## 2012-06-02 NOTE — Telephone Encounter (Signed)
Cassell Clement, MD 06/02/2012 5:33 PM Signed  Agree with plan Regis Bill, LPN 1/61/0960 4:54 PM Signed  Discussed with Dr. Patty Sermons and he reviewed medication list. Will decrease his Carvedilol to 1/2 tablet twice a day and Losartan to 1/2 daily. Will continue off Xanax as well. Will call back if no better. Advised daugter

## 2012-06-02 NOTE — Telephone Encounter (Signed)
Discussed with  Dr. Patty Sermons and he reviewed medication list.  Will decrease his Carvedilol to 1/2 tablet twice a day and Losartan to 1/2 daily.  Will continue off Xanax as well.  Will call back if no better.  Advised daugter

## 2012-06-06 ENCOUNTER — Telehealth: Payer: Self-pay | Admitting: Cardiology

## 2012-06-06 NOTE — Telephone Encounter (Signed)
Son states that pt has urinated.  Will continue to watch at home.  Advised per Dr. Patty Sermons that pt should go to [facility] ED if he continues to have issues voiding.

## 2012-06-06 NOTE — Telephone Encounter (Signed)
New problem:  Per Brooke from [facility], son just called patient having trouble urinate, wet the bed early this am. Also C/O left hand numb. Please advise on next step. Patient fell 5 time last week. Advise son/ patient to call the office when patient falls.

## 2012-06-09 ENCOUNTER — Telehealth: Payer: Self-pay | Admitting: Cardiology

## 2012-06-09 NOTE — Telephone Encounter (Signed)
New problem:  Per Heather from [facility]. Need an order to continue physical therapy.

## 2012-06-09 NOTE — Telephone Encounter (Signed)
VERBAL OKAY  GIVEN WILL FAX HARD COPY FOR MD TO SIGN AND RETURN .Zack Seal

## 2012-06-10 DIAGNOSIS — I5023 Acute on chronic systolic (congestive) heart failure: Secondary | ICD-10-CM

## 2012-06-12 ENCOUNTER — Telehealth (HOSPITAL_COMMUNITY): Payer: Self-pay | Admitting: *Deleted

## 2012-06-12 ENCOUNTER — Telehealth: Payer: Self-pay | Admitting: *Deleted

## 2012-06-12 ENCOUNTER — Other Ambulatory Visit: Payer: Self-pay | Admitting: Cardiology

## 2012-06-12 MED ORDER — FENOFIBRATE 48 MG PO TABS: 48.0000 mg | ORAL_TABLET | Freq: Every day | ORAL | Status: DC

## 2012-06-12 NOTE — Telephone Encounter (Signed)
Danton Clap 06/12/2012 10:19 AM Signed  Nurse with [facility], needs verbal order to re-certify for OT for 2 times a week for two more weeks

## 2012-06-12 NOTE — Telephone Encounter (Signed)
Nurse with [facility], needs verbal order to re-certify for OT for 2 times a week for two more weeks

## 2012-06-12 NOTE — Telephone Encounter (Signed)
Refilled tricor for 90 day supply

## 2012-06-12 NOTE — Telephone Encounter (Signed)
Pt would like a 90 day supply

## 2012-06-12 NOTE — Telephone Encounter (Signed)
Verbal order, ok left on voicemail of Herbert Seta [phone]   Hector Brunswick 06/12/2012 1:43 PM Signed  Danton Clap 06/12/2012 10:19 AM Signed  Nurse with [facility], needs verbal order to re-c ertify for OT for 2 times a week for two more weeks

## 2012-06-12 NOTE — Telephone Encounter (Signed)
Verbal ok left on [employer]

## 2012-06-15 ENCOUNTER — Telehealth: Payer: Self-pay | Admitting: Cardiology

## 2012-06-15 DIAGNOSIS — E039 Hypothyroidism, unspecified: Secondary | ICD-10-CM

## 2012-06-15 NOTE — Telephone Encounter (Signed)
They need to know if Rx was sent to [facility]

## 2012-06-16 MED ORDER — NITROGLYCERIN 0.4 MG SL SUBL: 0.4000 mg | SUBLINGUAL_TABLET | SUBLINGUAL | Status: AC | PRN

## 2012-06-16 MED ORDER — LOSARTAN POTASSIUM 100 MG PO TABS: ORAL_TABLET | ORAL | Status: DC

## 2012-06-16 MED ORDER — ASPIRIN EC 81 MG PO TBEC: 81.0000 mg | DELAYED_RELEASE_TABLET | Freq: Every day | ORAL | Status: AC

## 2012-06-16 MED ORDER — PANTOPRAZOLE SODIUM 40 MG PO TBEC: 40.0000 mg | DELAYED_RELEASE_TABLET | Freq: Every day | ORAL | Status: AC

## 2012-06-16 MED ORDER — ISOSORBIDE MONONITRATE ER 30 MG PO TB24: 30.0000 mg | ORAL_TABLET | Freq: Two times a day (BID) | ORAL | Status: AC

## 2012-06-16 MED ORDER — INSULIN GLARGINE 100 UNIT/ML ~~LOC~~ SOLN: 14.0000 [IU] | Freq: Every day | SUBCUTANEOUS | Status: DC

## 2012-06-16 MED ORDER — FERROUS SULFATE 325 (65 FE) MG PO TABS: 325.0000 mg | ORAL_TABLET | ORAL | Status: AC

## 2012-06-16 MED ORDER — AMIODARONE HCL 200 MG PO TABS: 200.0000 mg | ORAL_TABLET | Freq: Every day | ORAL | Status: DC

## 2012-06-16 MED ORDER — FENOFIBRATE 48 MG PO TABS: 48.0000 mg | ORAL_TABLET | Freq: Every day | ORAL | Status: AC

## 2012-06-16 MED ORDER — DOCUSATE SODIUM 100 MG PO CAPS: 100.0000 mg | ORAL_CAPSULE | Freq: Every day | ORAL | Status: AC

## 2012-06-16 MED ORDER — FUROSEMIDE 20 MG PO TABS: 20.0000 mg | ORAL_TABLET | Freq: Every day | ORAL | Status: DC

## 2012-06-16 MED ORDER — LEVOTHYROXINE SODIUM 25 MCG PO TABS: 25.0000 ug | ORAL_TABLET | Freq: Every day | ORAL | Status: DC

## 2012-06-16 MED ORDER — CARVEDILOL 25 MG PO TABS: ORAL_TABLET | ORAL | Status: DC

## 2012-06-16 NOTE — Telephone Encounter (Signed)
Son will pick up Rx's next week

## 2012-06-20 ENCOUNTER — Telehealth: Payer: Self-pay | Admitting: Cardiology

## 2012-06-20 NOTE — Telephone Encounter (Signed)
Left message these are ok to order

## 2012-06-20 NOTE — Telephone Encounter (Signed)
Home care needs verbal order for [facility] to come 3 times a week for 4 weeks and OT 2 times a week for 2 more weeks

## 2012-06-21 ENCOUNTER — Telehealth: Payer: Self-pay | Admitting: Cardiology

## 2012-06-21 NOTE — Telephone Encounter (Signed)
Left message I had not tried to call, call back if needed something

## 2012-06-21 NOTE — Telephone Encounter (Signed)
New Problem:    Patient called returning your call.  Please call back. 

## 2012-06-27 ENCOUNTER — Telehealth: Payer: Self-pay | Admitting: Cardiology

## 2012-06-27 NOTE — Telephone Encounter (Signed)
PT HAVING A PROBLEM WITH COREG, LOSING CIRCULATION IN HANDS AND FEET

## 2012-06-27 NOTE — Telephone Encounter (Signed)
Left message to call back  

## 2012-06-28 ENCOUNTER — Telehealth: Payer: Self-pay

## 2012-06-28 MED ORDER — CARVEDILOL 3.125 MG PO TABS: 3.1250 mg | ORAL_TABLET | Freq: Two times a day (BID) | ORAL | Status: DC

## 2012-06-28 NOTE — Telephone Encounter (Signed)
Pt's son rtn call, pls call back @ [phone]

## 2012-06-28 NOTE — Telephone Encounter (Signed)
Received call from pharmacist at [facility] [phone].Stated patient may be having a reaction to coreg.States patient is dizzy,neuropathy worse.Pharmacist also concerned patient not taking a blood thinner.Patient is Dr.Brackbill's patient.Message will be fowarded to Dr.Brackbill for advice.

## 2012-06-28 NOTE — Telephone Encounter (Signed)
Decrease coreg to 3.125 mg BID

## 2012-06-28 NOTE — Telephone Encounter (Signed)
Advised son and will send Rx to pharmacy

## 2012-06-28 NOTE — Telephone Encounter (Signed)
These symptoms could be from the carvedilol.  Cut way back on carvedilol to just 3.125 mg twice a day and see if this helps

## 2012-06-28 NOTE — Telephone Encounter (Signed)
Nurse that is coming out thinks some of the problems patient is having are side effects of the Coreg (per son).  Would like for  Dr. Patty Sermons to review, will forward to  Dr. Patty Sermons

## 2012-06-29 ENCOUNTER — Telehealth: Payer: Self-pay | Admitting: Cardiology

## 2012-06-29 NOTE — Telephone Encounter (Signed)
Blood sugars averaging 170-200 at night and in am 260-300.  Will forward to  Dr. Patty Sermons for review  Advised son of med change

## 2012-06-29 NOTE — Telephone Encounter (Signed)
Left message as son requested of medication change

## 2012-06-29 NOTE — Telephone Encounter (Signed)
Increase lasix to 20 mg BID and see if his nocturnal dyspnea and orthopnea improve.

## 2012-06-29 NOTE — Telephone Encounter (Addendum)
Home health nurse calling to let you know pt having sob at night, last night had to get up and set in recliner to catch his breath, pls advise pt what to do, ok to call nurse diane [phone] if any questions

## 2012-06-29 NOTE — Telephone Encounter (Signed)
Spoke with son and advised this Benjamin Noble was going to look into this.  He also said something about a boot for his foot twisting.  Called Diane and she said she didn't know anything about a boot (she is occupational therapy) she will contact physical therapy and someone will call back with recommendations.

## 2012-06-29 NOTE — Telephone Encounter (Signed)
O2 sat 98% at visit today.  Per nurse patient stated this has happened a couple of times while sleeping.  He has to get up and sleep in a chair for a couple of hours secondary to feeling like he can not catch his breath.  Diane is going to check with home health to see if Respiratory Therapy can get night oximetry.  She will call back if unable to get. Will forward to  Dr. Patty Sermons for review

## 2012-06-29 NOTE — Telephone Encounter (Signed)
Increase  lantus to 18 u daily.

## 2012-07-03 ENCOUNTER — Telehealth: Payer: Self-pay | Admitting: Cardiology

## 2012-07-03 NOTE — Telephone Encounter (Signed)
Left message to call back  

## 2012-07-03 NOTE — Telephone Encounter (Signed)
New problem:  Per Diane - [facility]  need an verbal order for respiratory therapy to check his ONO on room air.

## 2012-07-04 NOTE — Telephone Encounter (Signed)
Spoke with Diane and she is out with patient now and he states he is feeling better with medication changes.  Will hold off for now on respiratory therapy.

## 2012-07-05 ENCOUNTER — Telehealth: Payer: Self-pay | Admitting: Cardiology

## 2012-07-05 ENCOUNTER — Telehealth: Payer: Self-pay | Admitting: Internal Medicine

## 2012-07-05 NOTE — Telephone Encounter (Signed)
Advised ok to continue.

## 2012-07-05 NOTE — Telephone Encounter (Signed)
Please return call to [facility] [phone], she needs verbal order for home 2x per work for 2 more weeks, occupational therapy

## 2012-07-05 NOTE — Telephone Encounter (Signed)
error 

## 2012-07-06 ENCOUNTER — Telehealth: Payer: Self-pay | Admitting: Cardiology

## 2012-07-06 NOTE — Telephone Encounter (Signed)
Pt just wanted to let us know that they have been checking his blood sugar more often and that he may need a refill on his test strips.  Advised pt, OK.

## 2012-07-06 NOTE — Telephone Encounter (Signed)
Please return call to patient son Amada Jupiter, [phone]  Regarding testing strips.  Patient is tested several times a day and does not have a script for the med supplies.

## 2012-07-13 ENCOUNTER — Telehealth: Payer: Self-pay | Admitting: *Deleted

## 2012-07-13 NOTE — Telephone Encounter (Signed)
Physical therapist Herbert Seta phoned and ok to extend therapy. Per Herbert Seta patient doing much better

## 2012-07-18 ENCOUNTER — Encounter (HOSPITAL_COMMUNITY): Payer: Self-pay | Admitting: Emergency Medicine

## 2012-07-18 ENCOUNTER — Emergency Department (HOSPITAL_COMMUNITY): Payer: Medicare Other

## 2012-07-18 ENCOUNTER — Inpatient Hospital Stay (HOSPITAL_COMMUNITY)
Admission: EM | Admit: 2012-07-18 | Discharge: 2012-07-22 | DRG: 312 | Disposition: A | Discharge status: Home Health | Payer: Medicare Other | Attending: Internal Medicine | Admitting: Internal Medicine

## 2012-07-18 DIAGNOSIS — N182 Chronic kidney disease, stage 2 (mild): Secondary | ICD-10-CM

## 2012-07-18 DIAGNOSIS — Z794 Long term (current) use of insulin: Secondary | ICD-10-CM

## 2012-07-18 DIAGNOSIS — R000 Tachycardia, unspecified: Secondary | ICD-10-CM

## 2012-07-18 DIAGNOSIS — I2510 Atherosclerotic heart disease of native coronary artery without angina pectoris: Secondary | ICD-10-CM

## 2012-07-18 DIAGNOSIS — F341 Dysthymic disorder: Secondary | ICD-10-CM | POA: Diagnosis present

## 2012-07-18 DIAGNOSIS — I472 Ventricular tachycardia: Secondary | ICD-10-CM

## 2012-07-18 DIAGNOSIS — I5023 Acute on chronic systolic (congestive) heart failure: Secondary | ICD-10-CM

## 2012-07-18 DIAGNOSIS — I509 Heart failure, unspecified: Secondary | ICD-10-CM | POA: Diagnosis present

## 2012-07-18 DIAGNOSIS — I252 Old myocardial infarction: Secondary | ICD-10-CM

## 2012-07-18 DIAGNOSIS — Z951 Presence of aortocoronary bypass graft: Secondary | ICD-10-CM

## 2012-07-18 DIAGNOSIS — Z79899 Other long term (current) drug therapy: Secondary | ICD-10-CM

## 2012-07-18 DIAGNOSIS — I10 Essential (primary) hypertension: Secondary | ICD-10-CM

## 2012-07-18 DIAGNOSIS — Z9861 Coronary angioplasty status: Secondary | ICD-10-CM

## 2012-07-18 DIAGNOSIS — Z888 Allergy status to other drugs, medicaments and biological substances status: Secondary | ICD-10-CM

## 2012-07-18 DIAGNOSIS — E119 Type 2 diabetes mellitus without complications: Secondary | ICD-10-CM

## 2012-07-18 DIAGNOSIS — K5900 Constipation, unspecified: Secondary | ICD-10-CM

## 2012-07-18 DIAGNOSIS — I454 Nonspecific intraventricular block: Secondary | ICD-10-CM

## 2012-07-18 DIAGNOSIS — I609 Nontraumatic subarachnoid hemorrhage, unspecified: Secondary | ICD-10-CM

## 2012-07-18 DIAGNOSIS — R55 Syncope and collapse: Principal | ICD-10-CM

## 2012-07-18 DIAGNOSIS — I959 Hypotension, unspecified: Secondary | ICD-10-CM

## 2012-07-18 DIAGNOSIS — E7800 Pure hypercholesterolemia, unspecified: Secondary | ICD-10-CM

## 2012-07-18 DIAGNOSIS — N19 Unspecified kidney failure: Secondary | ICD-10-CM

## 2012-07-18 DIAGNOSIS — Z8249 Family history of ischemic heart disease and other diseases of the circulatory system: Secondary | ICD-10-CM

## 2012-07-18 DIAGNOSIS — F418 Other specified anxiety disorders: Secondary | ICD-10-CM

## 2012-07-18 DIAGNOSIS — Z950 Presence of cardiac pacemaker: Secondary | ICD-10-CM

## 2012-07-18 DIAGNOSIS — I495 Sick sinus syndrome: Secondary | ICD-10-CM

## 2012-07-18 DIAGNOSIS — I129 Hypertensive chronic kidney disease with stage 1 through stage 4 chronic kidney disease, or unspecified chronic kidney disease: Secondary | ICD-10-CM | POA: Diagnosis present

## 2012-07-18 DIAGNOSIS — Z7982 Long term (current) use of aspirin: Secondary | ICD-10-CM

## 2012-07-18 DIAGNOSIS — K219 Gastro-esophageal reflux disease without esophagitis: Secondary | ICD-10-CM | POA: Diagnosis present

## 2012-07-18 DIAGNOSIS — I214 Non-ST elevation (NSTEMI) myocardial infarction: Secondary | ICD-10-CM

## 2012-07-18 DIAGNOSIS — E039 Hypothyroidism, unspecified: Secondary | ICD-10-CM

## 2012-07-18 DIAGNOSIS — N179 Acute kidney failure, unspecified: Secondary | ICD-10-CM | POA: Diagnosis present

## 2012-07-18 DIAGNOSIS — G609 Hereditary and idiopathic neuropathy, unspecified: Secondary | ICD-10-CM | POA: Diagnosis present

## 2012-07-18 DIAGNOSIS — Z880 Allergy status to penicillin: Secondary | ICD-10-CM

## 2012-07-18 DIAGNOSIS — Z66 Do not resuscitate: Secondary | ICD-10-CM | POA: Diagnosis present

## 2012-07-18 HISTORY — DX: Chronic kidney disease, unspecified: N18.9

## 2012-07-18 HISTORY — DX: Presence of cardiac pacemaker: Z95.0

## 2012-07-18 HISTORY — DX: Atherosclerotic heart disease of native coronary artery without angina pectoris: I25.10

## 2012-07-18 LAB — COMPREHENSIVE METABOLIC PANEL
ALT: 6 U/L (ref 0–53)
Albumin: 3.1 g/dL — ABNORMAL LOW (ref 3.5–5.2)
Alkaline Phosphatase: 72 U/L (ref 39–117)
BUN: 36 mg/dL — ABNORMAL HIGH (ref 6–23)
Chloride: 101 mEq/L (ref 96–112)
Glucose, Bld: 224 mg/dL — ABNORMAL HIGH (ref 70–99)
Potassium: 4.8 mEq/L (ref 3.5–5.1)
Sodium: 138 mEq/L (ref 135–145)
Total Bilirubin: 0.3 mg/dL (ref 0.3–1.2)
Total Protein: 7.1 g/dL (ref 6.0–8.3)

## 2012-07-18 LAB — CARDIAC PANEL(CRET KIN+CKTOT+MB+TROPI)
CK, MB: 2 ng/mL (ref 0.3–4.0)
CK, MB: 2.3 ng/mL (ref 0.3–4.0)
Relative Index: INVALID (ref 0.0–2.5)
Total CK: 29 U/L (ref 7–232)
Troponin I: <0.3 ng/mL (ref <0.30)
Troponin I: <0.3 ng/mL (ref <0.30)

## 2012-07-18 LAB — CBC
HCT: 36.3 % — ABNORMAL LOW (ref 39.0–52.0)
Hemoglobin: 12.1 g/dL — ABNORMAL LOW (ref 13.0–17.0)
MCH: 30.4 pg (ref 26.0–34.0)
MCV: 91.2 fL (ref 78.0–100.0)
RBC: 3.98 MIL/uL — ABNORMAL LOW (ref 4.22–5.81)
WBC: 9.4 10*3/uL (ref 4.0–10.5)

## 2012-07-18 LAB — PROTIME-INR: Prothrombin Time: 14.6 seconds (ref 11.6–15.2)

## 2012-07-18 LAB — URINALYSIS, ROUTINE W REFLEX MICROSCOPIC
Bilirubin Urine: NEGATIVE
Glucose, UA: >1000 mg/dL — AB
Hgb urine dipstick: NEGATIVE
Specific Gravity, Urine: 1.018 (ref 1.005–1.030)
Urobilinogen, UA: 1 mg/dL (ref 0.0–1.0)

## 2012-07-18 LAB — CBC WITH DIFFERENTIAL/PLATELET
Basophils Relative: 1 % (ref 0–1)
Hemoglobin: 12.4 g/dL — ABNORMAL LOW (ref 13.0–17.0)
Lymphs Abs: 1.2 10*3/uL (ref 0.7–4.0)
Monocytes Relative: 8 % (ref 3–12)
Neutro Abs: 8.2 10*3/uL — ABNORMAL HIGH (ref 1.7–7.7)
Neutrophils Relative %: 72 % (ref 43–77)
Platelets: 230 10*3/uL (ref 150–400)
RBC: 4.03 MIL/uL — ABNORMAL LOW (ref 4.22–5.81)

## 2012-07-18 LAB — HEMOGLOBIN A1C
Hgb A1c MFr Bld: 9.3 % — ABNORMAL HIGH (ref <5.7)
Mean Plasma Glucose: 220 mg/dL — ABNORMAL HIGH (ref <117)

## 2012-07-18 LAB — URINE MICROSCOPIC-ADD ON

## 2012-07-18 LAB — GLUCOSE, CAPILLARY: Glucose-Capillary: 213 mg/dL — ABNORMAL HIGH (ref 70–99)

## 2012-07-18 LAB — TSH: TSH: 6.086 u[IU]/mL — ABNORMAL HIGH (ref 0.350–4.500)

## 2012-07-18 LAB — PRO B NATRIURETIC PEPTIDE: Pro B Natriuretic peptide (BNP): 2045 pg/mL — ABNORMAL HIGH (ref 0–450)

## 2012-07-18 LAB — CREATININE, SERUM: GFR calc Af Amer: 39 mL/min — ABNORMAL LOW (ref >90)

## 2012-07-18 MED ORDER — DOCUSATE SODIUM 100 MG PO CAPS
100.0000 mg | ORAL_CAPSULE | Freq: Two times a day (BID) | ORAL | Status: DC
  Administered 2012-07-18 – 2012-07-22 (×9): 100 mg via ORAL
  Filled 2012-07-18 (×10): qty 1

## 2012-07-18 MED ORDER — SODIUM CHLORIDE 0.9 % IJ SOLN
3.0000 mL | Freq: Two times a day (BID) | INTRAMUSCULAR | Status: DC
  Administered 2012-07-18 – 2012-07-22 (×9): 3 mL via INTRAVENOUS

## 2012-07-18 MED ORDER — FLEET ENEMA 7-19 GM/118ML RE ENEM: 1.0000 | ENEMA | Freq: Once | RECTAL | Status: AC | PRN

## 2012-07-18 MED ORDER — HYDRALAZINE HCL 20 MG/ML IJ SOLN
10.0000 mg | Freq: Three times a day (TID) | INTRAMUSCULAR | Status: DC | PRN
  Filled 2012-07-18: qty 0.5

## 2012-07-18 MED ORDER — SODIUM CHLORIDE 0.9 % IV BOLUS (SEPSIS)
1000.0000 mL | Freq: Once | INTRAVENOUS | Status: AC
  Administered 2012-07-18: 500 mL via INTRAVENOUS

## 2012-07-18 MED ORDER — ENOXAPARIN SODIUM 30 MG/0.3ML ~~LOC~~ SOLN
30.0000 mg | SUBCUTANEOUS | Status: DC
  Administered 2012-07-18 – 2012-07-21 (×4): 30 mg via SUBCUTANEOUS
  Filled 2012-07-18 (×5): qty 0.3

## 2012-07-18 MED ORDER — ONDANSETRON HCL 4 MG/2ML IJ SOLN: 4.0000 mg | Freq: Four times a day (QID) | INTRAMUSCULAR | Status: DC | PRN

## 2012-07-18 MED ORDER — PANTOPRAZOLE SODIUM 40 MG PO TBEC
40.0000 mg | DELAYED_RELEASE_TABLET | Freq: Every day | ORAL | Status: DC
  Administered 2012-07-18 – 2012-07-22 (×5): 40 mg via ORAL
  Filled 2012-07-18 (×4): qty 1

## 2012-07-18 MED ORDER — CARVEDILOL 3.125 MG PO TABS
3.1250 mg | ORAL_TABLET | Freq: Two times a day (BID) | ORAL | Status: DC
  Administered 2012-07-18 – 2012-07-22 (×9): 3.125 mg via ORAL
  Filled 2012-07-18 (×11): qty 1

## 2012-07-18 MED ORDER — FUROSEMIDE 20 MG PO TABS: 20.0000 mg | ORAL_TABLET | Freq: Every day | ORAL | Status: DC

## 2012-07-18 MED ORDER — AMIODARONE HCL 200 MG PO TABS
200.0000 mg | ORAL_TABLET | Freq: Every day | ORAL | Status: DC
  Administered 2012-07-18 – 2012-07-22 (×5): 200 mg via ORAL
  Filled 2012-07-18 (×5): qty 1

## 2012-07-18 MED ORDER — DOCUSATE SODIUM 100 MG PO CAPS: 100.0000 mg | ORAL_CAPSULE | Freq: Every day | ORAL | Status: DC

## 2012-07-18 MED ORDER — POLYETHYLENE GLYCOL 3350 17 G PO PACK
17.0000 g | PACK | Freq: Two times a day (BID) | ORAL | Status: DC
  Administered 2012-07-18 – 2012-07-22 (×8): 17 g via ORAL
  Filled 2012-07-18 (×12): qty 1

## 2012-07-18 MED ORDER — FERROUS SULFATE 325 (65 FE) MG PO TABS
325.0000 mg | ORAL_TABLET | ORAL | Status: DC
  Administered 2012-07-19 – 2012-07-21 (×2): 325 mg via ORAL
  Filled 2012-07-18 (×2): qty 1

## 2012-07-18 MED ORDER — SODIUM CHLORIDE 0.45 % IV SOLN: INTRAVENOUS | Status: DC

## 2012-07-18 MED ORDER — LEVOTHYROXINE SODIUM 25 MCG PO TABS
25.0000 ug | ORAL_TABLET | Freq: Every day | ORAL | Status: DC
  Administered 2012-07-18 – 2012-07-22 (×5): 25 ug via ORAL
  Filled 2012-07-18 (×6): qty 1

## 2012-07-18 MED ORDER — ASPIRIN EC 81 MG PO TBEC
81.0000 mg | DELAYED_RELEASE_TABLET | Freq: Every day | ORAL | Status: DC
  Administered 2012-07-18 – 2012-07-22 (×5): 81 mg via ORAL
  Filled 2012-07-18 (×5): qty 1

## 2012-07-18 MED ORDER — ISOSORBIDE MONONITRATE ER 30 MG PO TB24
30.0000 mg | ORAL_TABLET | Freq: Two times a day (BID) | ORAL | Status: DC
  Administered 2012-07-18 – 2012-07-22 (×9): 30 mg via ORAL
  Filled 2012-07-18 (×10): qty 1

## 2012-07-18 MED ORDER — ONDANSETRON HCL 4 MG PO TABS: 4.0000 mg | ORAL_TABLET | Freq: Four times a day (QID) | ORAL | Status: DC | PRN

## 2012-07-18 MED ORDER — INSULIN GLARGINE 100 UNIT/ML ~~LOC~~ SOLN
14.0000 [IU] | Freq: Every day | SUBCUTANEOUS | Status: DC
  Administered 2012-07-18 – 2012-07-21 (×4): 14 [IU] via SUBCUTANEOUS

## 2012-07-18 MED ORDER — INSULIN ASPART 100 UNIT/ML ~~LOC~~ SOLN
0.0000 [IU] | Freq: Three times a day (TID) | SUBCUTANEOUS | Status: DC
  Administered 2012-07-19: 3 [IU] via SUBCUTANEOUS
  Administered 2012-07-19 – 2012-07-20 (×2): 1 [IU] via SUBCUTANEOUS
  Administered 2012-07-20: 3 [IU] via SUBCUTANEOUS
  Administered 2012-07-20: 2 [IU] via SUBCUTANEOUS
  Administered 2012-07-21: 1 [IU] via SUBCUTANEOUS
  Administered 2012-07-21: 3 [IU] via SUBCUTANEOUS
  Administered 2012-07-22: 1 [IU] via SUBCUTANEOUS

## 2012-07-18 MED ORDER — SODIUM CHLORIDE 0.9 % IV SOLN
20.0000 mL | INTRAVENOUS | Status: DC
  Administered 2012-07-18 (×2): 20 mL via INTRAVENOUS

## 2012-07-18 MED ORDER — FENOFIBRATE 54 MG PO TABS
54.0000 mg | ORAL_TABLET | Freq: Every day | ORAL | Status: DC
  Administered 2012-07-18 – 2012-07-22 (×5): 54 mg via ORAL
  Filled 2012-07-18 (×5): qty 1

## 2012-07-18 MED ORDER — LOSARTAN POTASSIUM 50 MG PO TABS: 50.0000 mg | ORAL_TABLET | Freq: Every day | ORAL | Status: DC

## 2012-07-18 MED ORDER — BISACODYL 10 MG RE SUPP
10.0000 mg | Freq: Every day | RECTAL | Status: DC | PRN
  Administered 2012-07-21: 10 mg via RECTAL
  Filled 2012-07-18: qty 1

## 2012-07-18 MED ORDER — NITROGLYCERIN 0.4 MG SL SUBL: 0.4000 mg | SUBLINGUAL_TABLET | SUBLINGUAL | Status: DC | PRN

## 2012-07-18 NOTE — Progress Notes (Signed)
The son has called on three occassions to notify nurse that if patient's fingers and lips turn purple and begins to become diaphoretic, that means that patient is having chest pain. The son states that at home he gives patient a Nitro tab and that the patient feels a lot better. The son states this occurs because his father - patient- has a history of  neuropathy and cant feel when he has chest pain and that these particular signs and symptoms indicate that when it happens. This information was given to night nurse in change of shift report. Comforted the son in making him aware that patient is on continuous pulse oximeter to monitor oxygen intake and the son understood.

## 2012-07-18 NOTE — Progress Notes (Signed)
Patient transferred from ED to 4730 via stretcher. Pt is alert and oriented x4. Currently complains of no pain and has no signs and symptoms of distress. VSS with B?P elevated but has morning B?P meds to give, will notify MD. Lungs sounds clear in upper lobes bilat, clear/diminished in bilat lower lobes. [employer] tele box per order. Demonstrated call bell system and TV buttons to patient and patient understood. Will continue to monitor as needed to end of shift.

## 2012-07-18 NOTE — ED Notes (Signed)
PT. ARRIVED WITH EMS FROM HOME , FOUND BY SON SITTING ON THE COMMODE DIAPHORETIC , PALE AND CYANOTIC , INITIAL BP BY EMS BP= 50/23 ,  RECEIVED 500 CC NS BOLUS , BP IMPROVED TO 132/62 TRENDELENBURG , CBG= 232 , ALERT AT ARRIVAL , COMPLAINING OF " I 'M COLD".

## 2012-07-18 NOTE — Progress Notes (Signed)
Monitoring blood pressure throughout shift and at end of shift b/p is down to 158/78. Pt completed dinner and currently complains of no pain . Will continue to monitor as needed.

## 2012-07-18 NOTE — ED Provider Notes (Signed)
History     CSN: [id number]  Arrival date & time 07/18/12  0202   First MD Initiated Contact with Patient 07/18/12 0209      Chief Complaint  Patient presents with  . Loss of Consciousness    (Consider location/radiation/quality/duration/timing/severity/associated sxs/prior treatment) HPI 78year-old male presents from home via EMS with report of unresponsiveness. Per EMS, son woke in the middle night and found patient sitting on the toilet unresponsive. Son reports patient has had problems with constipation over the last week. Last week he was able to have a bowel movement with a enema. He's been unable to have a bowel movement today. EMS reports upon their arrival patient was diaphoretic, pale with blueness around the lips. Initial blood pressure 50/23. Patient received fluid bolus, placed in Trendelenburg,, had normal sugar. Patient's complaints upon arrival was in the cold. Patient denies chest pain shortness of breath abdominal pain. Patient reports even sitting on the toilet for unknown period time attempting to have a bowel movement. Patient with significant past medical history of any artery disease, hypertension, CHF, dysrhythmia requiring pacemaker.   Past Medical History  Diagnosis Date  . Ischemic heart disease     original CABG in 1984 with redo CABG in 1992; managed medically  . HTN (hypertension)   . Angina pectoris   . Hypercholesterolemia   . CHF (congestive heart failure)     EF is 35 to 40% per echo April 2013  . Arthritis   . Urinary incontinence   . Myocardial infarction   . Dysrhythmia     irregular  heart beats per patient  . Asthma   . Shortness of breath   . Diabetes mellitus     type 2  . GERD (gastroesophageal reflux disease)   . Neuromuscular disorder     periferal neruropathy  . Depression   . Syncope   . Subarachnoid hemorrhage April 2013    from fall/coumadin & aspirin stopped    Past Surgical History  Procedure Date  . Cardiac  catheterization 03/18/1998  . Coronary artery bypass graft 4098,1191    first at [facility]  . Circumcision 02/05/08  . Eye surgery   . Cardiac stents     Family History  Problem Relation Age of Onset  . Heart attack Father     History  Substance Use Topics  . Smoking status: Never Smoker   . Smokeless tobacco: Never Used  . Alcohol Use: No      Review of Systems  Unable to perform ROS: Unstable vital signs    Allergies  Penicillins; Lipitor; Macrodantin; Morphine and related; Ranolazine er; and Statins  Home Medications   Current Outpatient Rx  Name Route Sig Dispense Refill  . AMIODARONE HCL 200 MG PO TABS Oral Take 1 tablet (200 mg total) by mouth daily. 90 tablet 3  . ASPIRIN EC 81 MG PO TBEC Oral Take 1 tablet (81 mg total) by mouth daily. Chest pain 90 tablet 3  . CARVEDILOL 3.125 MG PO TABS Oral Take 1 tablet (3.125 mg total) by mouth 2 (two) times daily. 90 tablet 3    THIS IS A NEW DOSE  . DOCUSATE SODIUM 100 MG PO CAPS Oral Take 1 capsule (100 mg total) by mouth daily. 90 capsule 3  . FENOFIBRATE 48 MG PO TABS Oral Take 1 tablet (48 mg total) by mouth daily. 90 tablet 3  . FERROUS SULFATE 325 (65 FE) MG PO TABS Oral Take 1 tablet (325 mg total) by mouth  every other day. 45 tablet 3  . FUROSEMIDE 20 MG PO TABS Oral Take 20 mg by mouth daily.     . INSULIN GLARGINE 100 UNIT/ML [place] SOLN Subcutaneous Inject 14 Units into the skin at bedtime.     . ISOSORBIDE MONONITRATE ER 30 MG PO TB24 Oral Take 1 tablet (30 mg total) by mouth 2 (two) times daily. 180 tablet 3  . LEVOTHYROXINE SODIUM 25 MCG PO TABS Oral Take 1 tablet (25 mcg total) by mouth daily. 90 tablet 3  . LOSARTAN POTASSIUM 100 MG PO TABS Oral Take 50 mg by mouth daily.    Marland Kitchen PANTOPRAZOLE SODIUM 40 MG PO TBEC Oral Take 1 tablet (40 mg total) by mouth daily. 90 tablet 3  . NITROGLYCERIN 0.4 MG SL SUBL Sublingual Place 1 tablet (0.4 mg total) under the tongue every 5 (five) minutes as needed. For chest pain.  100 tablet 3    BP 172/72  Pulse 62  Temp 97.6 F (36.4 C) (Oral)  Resp 15  SpO2 99%  Physical Exam  Nursing note and vitals reviewed. Constitutional: He appears distressed.       Elderly, pale, ill-appearing  HENT:  Head: Normocephalic and atraumatic.  Nose: Nose normal.  Mouth/Throat: No oropharyngeal exudate.       Dry mucous membranes  Neck: Normal range of motion. Neck supple. No JVD present. No tracheal deviation present. No thyromegaly present.  Cardiovascular: Normal rate, regular rhythm, normal heart sounds and intact distal pulses.  Exam reveals no gallop and no friction rub.   No murmur heard. Pulmonary/Chest: Effort normal and breath sounds normal. No stridor. No respiratory distress. He has no wheezes. He has no rales. He exhibits no tenderness.  Abdominal: Soft. Bowel sounds are normal. He exhibits no distension and no mass. There is no tenderness. There is no rebound and no guarding.  Genitourinary:       Patient disimpacted by nursing staff  Musculoskeletal: He exhibits no edema and no tenderness.  Lymphadenopathy:    He has no cervical adenopathy.  Skin: No rash noted. He is diaphoretic. No erythema. There is pallor.    ED Course  Procedures (including critical care time)  Labs Reviewed  CBC WITH DIFFERENTIAL - Abnormal; Notable for the following:    WBC 11.4 (*)     RBC 4.03 (*)     Hemoglobin 12.4 (*)     HCT 36.8 (*)     Neutro Abs 8.2 (*)     Lymphocytes Relative 11 (*)     Eosinophils Relative 9 (*)     Eosinophils Absolute 1.0 (*)     All other components within normal limits  COMPREHENSIVE METABOLIC PANEL - Abnormal; Notable for the following:    Glucose, Bld 224 (*)     BUN 36 (*)     Creatinine, Ser 2.14 (*)     Albumin 3.1 (*)     GFR calc non Af Amer 26 (*)     GFR calc Af Amer 30 (*)     All other components within normal limits  PROTIME-INR  CARDIAC PANEL(CRET KIN+CKTOT+MB+TROPI)  URINALYSIS, ROUTINE W REFLEX MICROSCOPIC    Date:  07/18/2012  Rate: 60  Rhythm: atrial pacing  QRS Axis: left  Intervals: paced  ST/T Wave abnormalities: normal  Conduction Disutrbances:none  Narrative Interpretation:   Old EKG Reviewed: unchanged   No results found.   1. Syncope   2. Renal failure       MDM  78 year old male  with period of unresponsiveness possible syncope while sitting on the toilet, most likely vagal in origin. Patient with new renal failure. Will discuss with hospitalist for admission. Findings discussed with patient and son. They report their doctor wanted an increase in Lasix recently, but they have helped it do to patient not eating and drinking well.         Olivia Mackie, MD 07/18/12 [phone]

## 2012-07-18 NOTE — ED Notes (Signed)
BREAKFAST SERVED TO PT.

## 2012-07-18 NOTE — ED Notes (Signed)
PT. REPORTS CONSTIPATION FOR 4 DAYS .

## 2012-07-18 NOTE — Progress Notes (Signed)
Observation review is complete. 

## 2012-07-18 NOTE — H&P (Signed)
Benjamin Noble is an 78 y.o. male.   Chief Complaint: Passing out HPI: An 78 year old gentleman with multiple medical problems including coronary artery disease on chronic kidney disease who was brought in secondary to a syncopal episode while on the toilet at home. Patient was constipated apparently and was trying to go to the bathroom. Bowels the last thing he remembered. He was found to be hypotensive by EMS with systolic in the 80s as well as cyanotic when they met him. He was given some fluids and brought to the emergency room. Here patient is currently stable his blood pressure is 130/80. He had prior history of syncopal episodes. He has arrhythmias with systolicc dysfunction. He has a pacemaker in place and his rhythm is currently paced. Patient was disimpacted manually in the ER for his severe obstipation. He seemed to be dehydrated but has known history of chronic kidney disease. Past Medical History  Diagnosis Date  . Ischemic heart disease     original CABG in 1984 with redo CABG in 1992; managed medically  . HTN (hypertension)   . Angina pectoris   . Hypercholesterolemia   . CHF (congestive heart failure)     EF is 35 to 40% per echo April 2013  . Arthritis   . Urinary incontinence   . Myocardial infarction   . Dysrhythmia     irregular  heart beats per patient  . Asthma   . Shortness of breath   . Diabetes mellitus     type 2  . GERD (gastroesophageal reflux disease)   . Neuromuscular disorder     periferal neruropathy  . Depression   . Syncope   . Subarachnoid hemorrhage April 2013    from fall/coumadin & aspirin stopped    Past Surgical History  Procedure Date  . Cardiac catheterization 03/18/1998  . Coronary artery bypass graft [id number]    first at [facility]  . Circumcision 02/05/08  . Eye surgery   . Cardiac stents     Family History  Problem Relation Age of Onset  . Heart attack Father    Social History:  reports that he has never smoked. He  has never used smokeless tobacco. He reports that he does not drink alcohol or use illicit drugs.  Allergies:  Allergies  Allergen Reactions  . Penicillins Anaphylaxis and Swelling  . Lipitor (Atorvastatin Calcium) Other (See Comments)    Gi issues  . Macrodantin Other (See Comments)    Unknown   . Morphine And Related Other (See Comments)    "doesn't feel right"  . Ranolazine Er Other (See Comments)    constipation  . Statins Other (See Comments)    Leg weakness     (Not in a hospital admission)  Results for orders placed during the hospital encounter of 07/18/12 (from the past 48 hour(s))  CBC WITH DIFFERENTIAL     Status: Abnormal   Collection Time   07/18/12  2:30 AM      Component Value Range Comment   WBC 11.4 (*) 4.0 - 10.5 K/uL    RBC 4.03 (*) 4.22 - 5.81 MIL/uL    Hemoglobin 12.4 (*) 13.0 - 17.0 g/dL    HCT 54.0 (*) 98.1 - 52.0 %    MCV 91.3  78.0 - 100.0 fL    MCH 30.8  26.0 - 34.0 pg    MCHC 33.7  30.0 - 36.0 g/dL    RDW 19.1  47.8 - 29.5 %    Platelets 230  150 - 400 K/uL    Neutrophils Relative 72  43 - 77 %    Neutro Abs 8.2 (*) 1.7 - 7.7 K/uL    Lymphocytes Relative 11 (*) 12 - 46 %    Lymphs Abs 1.2  0.7 - 4.0 K/uL    Monocytes Relative 8  3 - 12 %    Monocytes Absolute 0.9  0.1 - 1.0 K/uL    Eosinophils Relative 9 (*) 0 - 5 %    Eosinophils Absolute 1.0 (*) 0.0 - 0.7 K/uL    Basophils Relative 1  0 - 1 %    Basophils Absolute 0.1  0.0 - 0.1 K/uL   COMPREHENSIVE METABOLIC PANEL     Status: Abnormal   Collection Time   07/18/12  2:30 AM      Component Value Range Comment   Sodium 138  135 - 145 mEq/L    Potassium 4.8  3.5 - 5.1 mEq/L    Chloride 101  96 - 112 mEq/L    CO2 29  19 - 32 mEq/L    Glucose, Bld 224 (*) 70 - 99 mg/dL    BUN 36 (*) 6 - 23 mg/dL    Creatinine, Ser 1.61 (*) 0.50 - 1.35 mg/dL    Calcium 8.9  8.4 - 09.6 mg/dL    Total Protein 7.1  6.0 - 8.3 g/dL    Albumin 3.1 (*) 3.5 - 5.2 g/dL    AST 14  0 - 37 U/L    ALT 6  0 - 53 U/L     Alkaline Phosphatase 72  39 - 117 U/L    Total Bilirubin 0.3  0.3 - 1.2 mg/dL    GFR calc non Af Amer 26 (*) >90 mL/min    GFR calc Af Amer 30 (*) >90 mL/min   PROTIME-INR     Status: Normal   Collection Time   07/18/12  2:30 AM      Component Value Range Comment   Prothrombin Time 14.6  11.6 - 15.2 seconds    INR 1.12  0.00 - 1.49   CARDIAC PANEL(CRET KIN+CKTOT+MB+TROPI)     Status: Normal   Collection Time   07/18/12  2:31 AM      Component Value Range Comment   Total CK 29  7 - 232 U/L    CK, MB 2.0  0.3 - 4.0 ng/mL    Troponin I <0.30  <0.30 ng/mL    Relative Index RELATIVE INDEX IS INVALID  0.0 - 2.5    Dg Chest Port 1 View  07/18/2012  *RADIOLOGY REPORT*  Clinical Data: Syncope, shortness of breath.  PORTABLE CHEST - 1 VIEW  Comparison: 04/28/2012  Findings: Cardiomegaly.  Aortic arch atherosclerosis.  Status post median sternotomy.  Multiple fractured sternal wires again noted. Left lung base scarring/pleural thickening, similar to priors.  No new areas of consolidation.  No pneumothorax or definite pleural effusion.  No acute osseous finding. Left chest wall battery pack with lead tips unchanged in position on AP view.  IMPRESSION: Left lung base scarring is similar to prior.  Cardiomegaly without overt edema.  Original Report Authenticated By: Waneta Martins, M.D.    Review of Systems  Constitutional: Negative.   HENT: Negative.   Eyes: Negative.   Respiratory: Negative.   Cardiovascular: Negative.   Gastrointestinal: Positive for constipation. Negative for heartburn, nausea, vomiting, abdominal pain, diarrhea, blood in stool and melena.  Musculoskeletal: Negative.   Skin: Negative.   Neurological: Positive for  loss of consciousness. Negative for dizziness, speech change, focal weakness and seizures.  Endo/Heme/Allergies: Negative.   Psychiatric/Behavioral: Negative.     Blood pressure 146/98, pulse 72, temperature 97.6 F (36.4 C), temperature source Oral, resp.  rate 14, SpO2 100.00%. Physical Exam  Constitutional: He is oriented to person, place, and time. He appears well-developed and well-nourished.  HENT:  Head: Normocephalic and atraumatic.  Right Ear: External ear normal.  Left Ear: External ear normal.  Nose: Nose normal.  Mouth/Throat: Oropharynx is clear and moist.  Eyes: Conjunctivae and EOM are normal. Pupils are equal, round, and reactive to light.  Neck: Normal range of motion. Neck supple.  Cardiovascular: Normal rate, regular rhythm and intact distal pulses.   Murmur heard.  Systolic murmur is present with a grade of 4/6  Respiratory: Effort normal and breath sounds normal.  GI: Soft. Bowel sounds are normal.  Musculoskeletal: Normal range of motion.  Neurological: He is alert and oriented to person, place, and time. He has normal reflexes.  Skin: Skin is warm and dry.  Psychiatric: He has a normal mood and affect. His behavior is normal. Judgment and thought content normal.     Assessment/Plan An 78 year old gentleman presenting with syncopal episode more than likely it was vasovagal. Patient also has some mild dehydration, hypotension and acute on chronic renal insufficiency.  Plan #1 syncope: Patient will be admitted for observation. He is a DO NOT RESUSCITATE. Based on the presentation is sounds like this is vasovagal in nature. We'll review patient's records to see if he had a recent echocardiogram. We will not pursue further workup as patient is not a candidate for any surgical intervention. Patient appears entirely hydrated and follow his orthostatic vitals.  #2 hypotension: Most likely secondary to the vagal response.  #3 coronary artery disease: Seems stable. We'll check cardiac enzymes.  #4 constipation: Patient has been disimpacted manually in the ER. Will give scheduled laxatives while in the hospital.  #5 chronic kidney disease: Hydrate the patient to correct the acute component.  #6 diabetes: Continue his home  medications including sliding scale insulin.  #7 CODE STATUS: He is a DO NOT RESUSCITATE  Sheilia Reznick,LAWAL 07/18/2012, 5:54 AM

## 2012-07-18 NOTE — ED Notes (Signed)
-   Benjamin Noble (son) [phone], [phone], or [phone] -- Contact numbers

## 2012-07-18 NOTE — Progress Notes (Signed)
Subjective: Patient feeling well. No chest pain or dyspnea.  Had syncope after BM.   Objective: Filed Vitals:   07/18/12 0611 07/18/12 0630 07/18/12 0800 07/18/12 0940  BP: 148/77 162/72 167/66 190/87  Pulse: 64 60  69  Temp:    98.2 F (36.8 C)  TempSrc:    Oral  Resp: 19 12 16 16   Height:    6\' 1"  (1.854 m)  Weight:    73.7 kg (162 lb 7.7 oz)  SpO2: 99% 100%  100%   Weight change:    General: Alert, awake, oriented x3, in no acute distress.  HEENT: No bruits, no goiter.  Heart: Murmur, Regular rate and rhythm, without murmurs, rubs, gallops.  Lungs: CTA,  bilateral air movement.  Abdomen: Soft, nontender, nondistended, positive bowel sounds.  Neuro: Grossly intact, nonfocal.   Lab Results:  Basename 07/18/12 1015 07/18/12 0230  NA -- 138  K -- 4.8  CL -- 101  CO2 -- 29  GLUCOSE -- 224*  BUN -- 36*  CREATININE 1.74* 2.14*  CALCIUM -- 8.9  MG -- --  PHOS -- --    Basename 07/18/12 0230  AST 14  ALT 6  ALKPHOS 72  BILITOT 0.3  PROT 7.1  ALBUMIN 3.1*    Basename 07/18/12 1015 07/18/12 0230  WBC 9.4 11.4*  NEUTROABS -- 8.2*  HGB 12.1* 12.4*  HCT 36.3* 36.8*  MCV 91.2 91.3  PLT 207 230    Basename 07/18/12 1015 07/18/12 0231  CKTOTAL 34 29  CKMB 2.3 2.0  CKMBINDEX -- --  TROPONINI <0.30 <0.30   Micro Results: No results found for this or any previous visit (from the past 240 hour(s)).  Studies/Results: Dg Chest Port 1 View  07/18/2012  *RADIOLOGY REPORT*  Clinical Data: Syncope, shortness of breath.  PORTABLE CHEST - 1 VIEW  Comparison: 04/28/2012  Findings: Cardiomegaly.  Aortic arch atherosclerosis.  Status post median sternotomy.  Multiple fractured sternal wires again noted. Left lung base scarring/pleural thickening, similar to priors.  No new areas of consolidation.  No pneumothorax or definite pleural effusion.  No acute osseous finding. Left chest wall battery pack with lead tips unchanged in position on AP view.  IMPRESSION: Left lung base  scarring is similar to prior.  Cardiomegaly without overt edema.  Original Report Authenticated By: Waneta Martins, M.D.    Medications: I have reviewed the patient's current medications. 1 syncope: Vasovagal likely. Monitor telemetry, cycle cardiac enzymes.  2-Hypotension: resolved. resume home medications.  3-Acute renal failure: Received IV bolus ED. Hold on IV fluids Ef 30 %. Hold Cozaar and lasix. Repeat bmet am.  4 diabetes: Continue his home medications including sliding scale insulin.      LOS: 0 days   REGALADO,BELKYS M.D.  [facility] 07/18/2012, 11:44 AM

## 2012-07-18 NOTE — Progress Notes (Signed)
Nurse tech notified nurse of elevated blood pressure done by [facility]omap. Nurse rechecked blood pressure manually, and result 184/70. Pt states he feels ok. Pt rec'd B/P meds about an hour and a half ago. Will continue to monitor to end of shift.

## 2012-07-18 NOTE — ED Notes (Signed)
PT. PASSED LARGE AMOUNT OF HARD STOOLS - DISIMPACTED BY RN AT BEDSIDE. PT. STATES HE FELT BETTER.

## 2012-07-18 NOTE — ED Notes (Signed)
Patient is unable to urinate at this time 

## 2012-07-19 DIAGNOSIS — E039 Hypothyroidism, unspecified: Secondary | ICD-10-CM

## 2012-07-19 DIAGNOSIS — I509 Heart failure, unspecified: Secondary | ICD-10-CM

## 2012-07-19 DIAGNOSIS — I5023 Acute on chronic systolic (congestive) heart failure: Secondary | ICD-10-CM | POA: Diagnosis present

## 2012-07-19 DIAGNOSIS — K5900 Constipation, unspecified: Secondary | ICD-10-CM | POA: Diagnosis present

## 2012-07-19 LAB — COMPREHENSIVE METABOLIC PANEL
AST: 11 U/L (ref 0–37)
BUN: 28 mg/dL — ABNORMAL HIGH (ref 6–23)
CO2: 26 mEq/L (ref 19–32)
Calcium: 8.4 mg/dL (ref 8.4–10.5)
Chloride: 102 mEq/L (ref 96–112)
Creatinine, Ser: 1.63 mg/dL — ABNORMAL HIGH (ref 0.50–1.35)
GFR calc non Af Amer: 36 mL/min — ABNORMAL LOW (ref >90)
Total Bilirubin: 0.3 mg/dL (ref 0.3–1.2)

## 2012-07-19 LAB — CBC
Hemoglobin: 11.6 g/dL — ABNORMAL LOW (ref 13.0–17.0)
MCH: 31.4 pg (ref 26.0–34.0)
Platelets: 228 10*3/uL (ref 150–400)
RBC: 3.7 MIL/uL — ABNORMAL LOW (ref 4.22–5.81)
WBC: 6.7 10*3/uL (ref 4.0–10.5)

## 2012-07-19 LAB — GLUCOSE, CAPILLARY

## 2012-07-19 LAB — CARDIAC PANEL(CRET KIN+CKTOT+MB+TROPI)
Relative Index: INVALID (ref 0.0–2.5)
Total CK: 27 U/L (ref 7–232)
Troponin I: <0.3 ng/mL (ref <0.30)

## 2012-07-19 MED ORDER — BIOTENE DRY MOUTH MT LIQD
15.0000 mL | Freq: Two times a day (BID) | OROMUCOSAL | Status: DC
  Administered 2012-07-19 – 2012-07-22 (×6): 15 mL via OROMUCOSAL

## 2012-07-19 MED ORDER — ALPRAZOLAM 0.25 MG PO TABS
0.2500 mg | ORAL_TABLET | Freq: Two times a day (BID) | ORAL | Status: DC | PRN
Start: 2012-07-19 — End: 2012-07-22

## 2012-07-19 MED ORDER — FUROSEMIDE 40 MG PO TABS
60.0000 mg | ORAL_TABLET | Freq: Two times a day (BID) | ORAL | Status: DC
  Administered 2012-07-19 – 2012-07-20 (×2): 60 mg via ORAL
  Filled 2012-07-19 (×5): qty 1

## 2012-07-19 NOTE — Progress Notes (Signed)
Inpatient Diabetes Program Recommendations  AACE/ADA: New Consensus Statement on Inpatient Glycemic Control  Target Ranges:  Prepandial:   less than 140 mg/dL      Peak postprandial:   less than 180 mg/dL (1-2 hours)      Critically ill patients:  140 - 180 mg/dL  Pager:  [phone] Hours:  8 am-10pm   Reason for Visit: Elevated prandial glucose:  120 , 216 mg/dl  Inpatient Diabetes Program Recommendations  Insulin - Meal Coverage: Add Novolog 4 units TID for meal coverage   Also made referral for patient with [facility].  Thank you.  Alfredia Client PhD, RN Diabetes Coordinator  Office:  [phone] Team Pager:  [phone]

## 2012-07-19 NOTE — Progress Notes (Signed)
[facility] PROGRESS NOTE  Daymian Lill ZOX:[id number] DOB: 1922/01/07 DOA: 07/18/2012 PCP: Cassell Clement, MD  Assessment/Plan: Principal Problem:  *Acute on chronic systolic CHF (congestive heart failure): Patient has a previous history of CHF and his BNP was elevated in the 2000 yesterday. Some of this may be accountable from renal dysfunction, but certainly with the fluids he got for syncope he is in some volume overload. Have started him on oral diuretics but a stronger dose than what he normally gets normally 40 daily and increase to 60 twice a day) will follow urine output and monitor closely.  Active Problems:  Hypertension: Blood pressure has been on the rise which should improve significantly once we start to better diurese him.   Diabetes mellitus type II: Blood sugars ranging from the 120s to 200s. We'll watch closely.   Syncope: Despite multiple medical reasons, feel that his history is classic and likely would still call this vasovagal syncope.   CKD (chronic kidney disease), stage II: Improved today with some hydration. His back to his baseline. We started diuretics, will need to monitor renal function closely.   CAD (coronary artery disease): Chronic. No evidence of any acute coronary syndrome.   Pacemaker-Medtronic: Bite telemetry, pacemaker is working okay.   Hypotension: Stable. Blood pressures are now increase. Patient back on his oral medications plus a started diuresis.  Hypothyroidism: Patient had his thyroid checked approximately 3-1/2 months ago and at that time was 5.78 and he was started on Synthroid. Repeat TSH today is at 6.086. He is only on 25 mcg and given acute illness, we'll plan to have his TSH level checked again in October and at that time his thyroid can be adjusted if need be.   Constipation: He  suffers from chronic constipation and certainly with diabetes, is not doing too well. He is now started on daily MiraLAX.   Depression:  Family can 5 the patient is having a rough time caring for his wife of 60+ years who has end-stage Alzheimer's. He at times is prone to spelled anger as well as depression. They have requested Xanax when necessary which the patient was on previously but was discontinued because of fall risk, although this point now the patient is mostly bedbound (they have 24 hour care nurses at home)  Code Status: DO NOT RESUSCITATE Family Communication: Discussed with patient's daughter and son-in-law at the bedside Disposition Plan: Home with home health versus possible short-term skilled nursing (although the family may oppose this)   Brief narrative: 78 year old white male with extensive past medical history including arrhythmia status post pacemaker, hypothyroidism, chronic kidney disease and systolic congestive heart failure who presents to the emergency room after having a syncopal event while he was straining from severe constipation.  Consultants:  None  Procedures:  None  Antibiotics:  None  HPI/Subjective: Patient today's feeling a little bit better. His bowels are moving a little bit more. He still feeling very weak. His problems with severe chronic lower extremity neuropathy and occasionally complains of bilateral hand tingling. Denies any shortness of breath or chest pain.  Objective: Filed Vitals:   07/18/12 2041 07/19/12 0532 07/19/12 0936 07/19/12 1420  BP: 163/76 152/79 157/69 181/88  Pulse: 60 59 60 60  Temp: 98.9 F (37.2 C) 98.3 F (36.8 C)  97.5 F (36.4 C)  TempSrc: Oral Oral  Oral  Resp: 18 18  20   Height:      Weight:  74 kg (163 lb 2.3 oz)    SpO2:  99% 99%  99%    Intake/Output Summary (Last 24 hours) at 07/19/12 1635 Last data filed at 07/19/12 1250  Gross per 24 hour  Intake    423 ml  Output    600 ml  Net   -177 ml    Exam:   General:  Alert and oriented x2, no acute distress, fatigued, looks about stated age  HEENT: Normocephalic, atraumatic,  mucous membranes are dry, no appreciable thyromegaly, sclera is nonicteric  Cardiovascular: Paced rhythm, 2/6 systolic ejection murmur  Respiratory: Clear to auscultation bilaterally  Abdomen: Soft, nontender, nondistended, positive bowel sounds  Extremities: No clubbing or cyanosis, 1+ pitting edema from the knees down.  Neurology: Signs of some chronic neuropathy from diabetes and a large family for decreased sensation  Psychiatric: No evidence of acute psychoses, although patient does have some very mild dementia. Family also reports that he's been having problems with anger management and depression because of his wife of 60+ years he is having end-stage Alzheimer's dementia.  Data Reviewed: Basic Metabolic Panel:  Lab 07/19/12 1610 07/18/12 1015 07/18/12 0230  NA 136 -- 138  K 4.4 -- 4.8  CL 102 -- 101  CO2 26 -- 29  GLUCOSE 197* -- 224*  BUN 28* -- 36*  CREATININE 1.63* 1.74* 2.14*  CALCIUM 8.4 -- 8.9  MG -- -- --  PHOS -- -- --   Liver Function Tests:  Lab 07/19/12 0058 07/18/12 0230  AST 11 14  ALT 6 6  ALKPHOS 69 72  BILITOT 0.3 0.3  PROT 6.4 7.1  ALBUMIN 2.7* 3.1*   CBC:  Lab 07/19/12 0058 07/18/12 1015 07/18/12 0230  WBC 6.7 9.4 11.4*  NEUTROABS -- -- 8.2*  HGB 11.6* 12.1* 12.4*  HCT 33.9* 36.3* 36.8*  MCV 91.6 91.2 91.3  PLT 228 207 230   Cardiac Enzymes:  Lab 07/19/12 0057 07/18/12 1723 07/18/12 1015 07/18/12 0231  CKTOTAL 27 31 34 29  CKMB 2.0 2.3 2.3 2.0  CKMBINDEX -- -- -- --  TROPONINI <0.30 <0.30 <0.30 <0.30   BNP (last 3 results)  Basename 07/18/12 1015  PROBNP 2045.0*   CBG:  Lab 07/19/12 1615 07/19/12 1105 07/19/12 0639 07/18/12 2208 07/18/12 0622  GLUCAP 123* 216* 120* 213* 190*     Studies: Dg Chest Port 1 View 07/18/2012  IMPRESSION: Left lung base scarring is similar to prior.  Cardiomegaly without overt edema.  Original Report Authenticated By: Waneta Martins, M.D.    Scheduled Meds:   . amiodarone  200 mg Oral  Daily  . antiseptic oral rinse  15 mL Mouth Rinse BID  . aspirin EC  81 mg Oral Daily  . carvedilol  3.125 mg Oral BID WC  . docusate sodium  100 mg Oral BID  . enoxaparin (LOVENOX) injection  30 mg Subcutaneous Q24H  . fenofibrate  54 mg Oral Daily  . ferrous sulfate  325 mg Oral QODAY  . furosemide  60 mg Oral BID  . insulin aspart  0-9 Units Subcutaneous TID WC  . insulin glargine  14 Units Subcutaneous QHS  . isosorbide mononitrate  30 mg Oral BID  . levothyroxine  25 mcg Oral QAC breakfast  . pantoprazole  40 mg Oral Daily  . polyethylene glycol  17 g Oral BID  . sodium chloride  3 mL Intravenous Q12H   Continuous Infusions:   . DISCONTD: sodium chloride 20 mL (07/18/12 1856)    Time spent: 45 minutes    Nyaire Denbleyker K  [facility] Pager [phone]. If 8PM-8AM, please contact night-coverage at [email or web address], password [facility] 07/19/2012, 4:35 PM  LOS: 1 day

## 2012-07-20 DIAGNOSIS — R55 Syncope and collapse: Principal | ICD-10-CM

## 2012-07-20 LAB — BASIC METABOLIC PANEL
BUN: 30 mg/dL — ABNORMAL HIGH (ref 6–23)
CO2: 28 mEq/L (ref 19–32)
Chloride: 96 mEq/L (ref 96–112)
GFR calc non Af Amer: 33 mL/min — ABNORMAL LOW (ref >90)
Glucose, Bld: 140 mg/dL — ABNORMAL HIGH (ref 70–99)
Potassium: 4 mEq/L (ref 3.5–5.1)
Sodium: 134 mEq/L — ABNORMAL LOW (ref 135–145)

## 2012-07-20 LAB — GLUCOSE, CAPILLARY
Glucose-Capillary: 134 mg/dL — ABNORMAL HIGH (ref 70–99)
Glucose-Capillary: 245 mg/dL — ABNORMAL HIGH (ref 70–99)

## 2012-07-20 LAB — PRO B NATRIURETIC PEPTIDE: Pro B Natriuretic peptide (BNP): 5703 pg/mL — ABNORMAL HIGH (ref 0–450)

## 2012-07-20 MED ORDER — FUROSEMIDE 20 MG PO TABS
20.0000 mg | ORAL_TABLET | Freq: Every day | ORAL | Status: DC
  Administered 2012-07-21 – 2012-07-22 (×2): 20 mg via ORAL
  Filled 2012-07-20 (×2): qty 1

## 2012-07-20 NOTE — Progress Notes (Addendum)
[facility] PROGRESS NOTE  Benjamin Noble ZOX:[id number] DOB: [birth year] DOA: 07/18/2012 PCP: Cassell Clement, MD  Assessment/Plan: Principal Problem:  *Acute on chronic systolic CHF (congestive heart failure): Patient has a previous history of CHF and his BNP was elevated in the 2000 yesterday. Some of this may be accountable from renal dysfunction, but certainly with the fluids he got for syncope he is in some volume overload. Was started on 60 mg twice a day of Lasix by mouth and put out a moderate amount of fluid. Seen by cardiology in followup today, we'll change him back to his home dose. Recheck renal function in the morning and if it is not too much more elevated, can discharge home.  Active Problems:  Hypertension: Blood pressure has nicely improved now that he has been better diuresed   Diabetes mellitus type II: Blood sugars ranging from the 120s to 200s. We'll watch closely.   Syncope: Despite multiple medical reasons, feel that his history is classic and likely would still call this vasovagal syncope.   CKD (chronic kidney disease), stage II: Improved today with some hydration. His back to his baseline. Renal function slightly up today and will recheck tomorrow.   CAD (coronary artery disease): Chronic. No evidence of any acute coronary syndrome.   Pacemaker-Medtronic: Bite telemetry, pacemaker is working okay.   Hypotension: Stable. Blood pressures are now increase. Patient back on his oral medications plus a started diuresis.  Hypothyroidism: Patient had his thyroid checked approximately 3-1/2 months ago and at that time was 5.78 and he was started on Synthroid. Repeat TSH today is at 6.086. He is only on 25 mcg and given acute illness, we'll plan to have his TSH level checked again in October and at that time his thyroid can be adjusted if need be.   Constipation: He  suffers from chronic constipation and certainly with diabetes, is not doing too well. He is  now started on daily MiraLAX.   Depression: Family can 5 the patient is having a rough time caring for his wife of 60+ years who has end-stage Alzheimer's. He at times is prone to spelled anger as well as depression. They have requested Xanax when necessary which the patient was on previously but was discontinued because of fall risk, although this point now the patient is mostly bedbound (they have 24 hour care nurses at home)  Code Status: DO NOT RESUSCITATE Family Communication: Left message with family to Disposition Plan: Home, patient has 24-hour nursing care   Brief narrative: 78 year old white male with extensive past medical history including arrhythmia status post pacemaker, hypothyroidism, chronic kidney disease and systolic congestive heart failure who presents to the emergency room after having a syncopal event while he was straining from severe constipation.  Consultants:  None  Procedures:  None  Antibiotics:  None  HPI/Subjective: Patient today's feeling a little bit better. His bowels are moving a little bit more. He still feeling very weak. His problems with severe chronic lower extremity neuropathy and occasionally complains of bilateral hand tingling. Denies any shortness of breath or chest pain.  Objective: Filed Vitals:   07/19/12 2159 07/20/12 0040 07/20/12 0502 07/20/12 1356  BP: 183/90 150/76 144/89 133/74  Pulse: 60 60 61 60  Temp:   98.1 F (36.7 C) 99.3 F (37.4 C)  TempSrc:   Oral Oral  Resp:  18 18 19   Height:      Weight:   75.1 kg (165 lb 9.1 oz)   SpO2:  95% 94%  92%    Intake/Output Summary (Last 24 hours) at 07/20/12 1550 Last data filed at 07/20/12 1326  Gross per 24 hour  Intake    243 ml  Output   2100 ml  Net  -1857 ml    Exam:   General:  Alert and oriented x2, no acute distress, fatigued, looks about stated age  HEENT: Normocephalic, atraumatic, mucous membranes are dry, no appreciable thyromegaly, sclera is  nonicteric  Cardiovascular: Paced rhythm, 2/6 systolic ejection murmur  Respiratory: Clear to auscultation bilaterally  Abdomen: Soft, nontender, nondistended, positive bowel sounds  Extremities: No clubbing or cyanosis, 1+ pitting edema from the knees down.  Neurology: Signs of some chronic neuropathy from diabetes and a large family for decreased sensation  Psychiatric: No evidence of acute psychoses, although patient does have some very mild dementia. Family also reports that he's been having problems with anger management and depression because of his wife of 60+ years he is having end-stage Alzheimer's dementia.  Data Reviewed: Basic Metabolic Panel:  Lab 07/20/12 1610 07/19/12 0058 07/18/12 1015 07/18/12 0230  NA 134* 136 -- 138  K 4.0 4.4 -- 4.8  CL 96 102 -- 101  CO2 28 26 -- 29  GLUCOSE 140* 197* -- 224*  BUN 30* 28* -- 36*  CREATININE 1.77* 1.63* 1.74* 2.14*  CALCIUM 9.0 8.4 -- 8.9  MG -- -- -- --  PHOS -- -- -- --   Liver Function Tests:  Lab 07/19/12 0058 07/18/12 0230  AST 11 14  ALT 6 6  ALKPHOS 69 72  BILITOT 0.3 0.3  PROT 6.4 7.1  ALBUMIN 2.7* 3.1*   CBC:  Lab 07/19/12 0058 07/18/12 1015 07/18/12 0230  WBC 6.7 9.4 11.4*  NEUTROABS -- -- 8.2*  HGB 11.6* 12.1* 12.4*  HCT 33.9* 36.3* 36.8*  MCV 91.6 91.2 91.3  PLT 228 207 230   Cardiac Enzymes:  Lab 07/19/12 0057 07/18/12 1723 07/18/12 1015 07/18/12 0231  CKTOTAL 27 31 34 29  CKMB 2.0 2.3 2.3 2.0  CKMBINDEX -- -- -- --  TROPONINI <0.30 <0.30 <0.30 <0.30   BNP (last 3 results)  Basename 07/20/12 0615 07/18/12 1015  PROBNP 5703.0* 2045.0*   CBG:  Lab 07/20/12 1051 07/20/12 0622 07/19/12 2141 07/19/12 1615 07/19/12 1105  GLUCAP 245* 134* 151* 123* 216*     Studies: Dg Chest Port 1 View 07/18/2012  IMPRESSION: Left lung base scarring is similar to prior.  Cardiomegaly without overt edema.  Original Report Authenticated By: Waneta Martins, M.D.    Scheduled Meds:    . amiodarone   200 mg Oral Daily  . antiseptic oral rinse  15 mL Mouth Rinse BID  . aspirin EC  81 mg Oral Daily  . carvedilol  3.125 mg Oral BID WC  . docusate sodium  100 mg Oral BID  . enoxaparin (LOVENOX) injection  30 mg Subcutaneous Q24H  . fenofibrate  54 mg Oral Daily  . ferrous sulfate  325 mg Oral QODAY  . furosemide  20 mg Oral Daily  . insulin aspart  0-9 Units Subcutaneous TID WC  . insulin glargine  14 Units Subcutaneous QHS  . isosorbide mononitrate  30 mg Oral BID  . levothyroxine  25 mcg Oral QAC breakfast  . pantoprazole  40 mg Oral Daily  . polyethylene glycol  17 g Oral BID  . sodium chloride  3 mL Intravenous Q12H  . DISCONTD: furosemide  60 mg Oral BID   Continuous Infusions:   Time spent:  40 minutes    Hollice Espy  [facility] Pager [phone]. If 8PM-8AM, please contact night-coverage at [email or web address], password [facility] 07/20/2012, 3:50 PM  LOS: 2 days

## 2012-07-20 NOTE — Evaluation (Signed)
Physical Therapy Evaluation Patient Details Name: Benjamin Noble MRN: [id number] DOB: Jan 26, 1922 Today's Date: 07/20/2012 Time: 2595-6387 PT Time Calculation (min): 22 min  PT Assessment / Plan / Recommendation Clinical Impression  Pt is an 78 y/o male admitted s/p fall due to ? syncope along with the below PT problem list.  Pt would benefit from acute PT to maximize independence and facilitate d/c home with HHPT.    PT Assessment  Patient needs continued PT services    Follow Up Recommendations  Home health PT    Barriers to Discharge None      Equipment Recommendations  None recommended by PT    Recommendations for Other Services     Frequency Min 3X/week    Precautions / Restrictions Precautions Precautions: None Restrictions Weight Bearing Restrictions: No   Pertinent Vitals/Pain None      Mobility  Bed Mobility Bed Mobility: Supine to Sit;Sit to Supine Supine to Sit: 4: Min assist;HOB flat Sit to Supine: 4: Min assist;HOB flat Details for Bed Mobility Assistance: Assist to trunk to translate anterior and slow descent to surface with cues for safe sequence. Transfers Transfers: Sit to Stand;Stand to Sit Sit to Stand: 4: Min assist;With upper extremity assist;From bed Stand to Sit: 4: Min assist;With upper extremity assist;To bed Details for Transfer Assistance: Assist to translate trunk anterior over BOS as well as slow descent with cues for safest hand placement. Ambulation/Gait Ambulation/Gait Assistance: 4: Min assist Ambulation Distance (Feet): 30 Feet Assistive device: Rolling walker Ambulation/Gait Assistance Details: Assist for balance with cues throughout for tall extended posture at hips/chest as well as safety inside RW by stepping into RW during gait. Gait Pattern: Step-through pattern;Decreased stride length;Wide base of support;Trunk flexed Stairs: No Wheelchair Mobility Wheelchair Mobility: No    Exercises     PT Diagnosis: Difficulty  walking;Generalized weakness  PT Problem List: Decreased strength;Decreased activity tolerance;Decreased balance;Decreased mobility;Decreased knowledge of use of DME PT Treatment Interventions: DME instruction;Gait training;Functional mobility training;Therapeutic activities;Balance training;Patient/family education   PT Goals Acute Rehab PT Goals PT Goal Formulation: With patient Time For Goal Achievement: 07/27/12 Potential to Achieve Goals: Good Pt will go Supine/Side to Sit: with modified independence PT Goal: Supine/Side to Sit - Progress: Goal set today Pt will go Sit to Supine/Side: with modified independence PT Goal: Sit to Supine/Side - Progress: Goal set today Pt will go Sit to Stand: with modified independence PT Goal: Sit to Stand - Progress: Goal set today Pt will go Stand to Sit: with modified independence PT Goal: Stand to Sit - Progress: Goal set today Pt will Ambulate: >150 feet;with modified independence;with least restrictive assistive device PT Goal: Ambulate - Progress: Goal set today  Visit Information  Last PT Received On: 07/20/12 Assistance Needed: +1    Subjective Data  Subjective: "I don't get around too much anymore." Patient Stated Goal: Go home.   Prior Functioning  Home Living Lives With: Spouse Available Help at Discharge: Other (Comment) (Wife has Alzheimer's with 24 hour care.) Type of Home: House Home Access: Ramped entrance Home Layout: One level Home Adaptive Equipment: Walker - rolling;Wheelchair - manual Prior Function Level of Independence: Independent with assistive device(s) (Using RW.) Able to Take Stairs?: No Driving: No Vocation: Retired Musician: No difficulties    Cognition  Overall Cognitive Status: Appears within functional limits for tasks assessed/performed Arousal/Alertness: Awake/alert Orientation Level: Appears intact for tasks assessed Behavior During Session: [facility] for tasks performed      Extremity/Trunk Assessment Right Upper Extremity Assessment  RUE ROM/Strength/Tone: Within functional levels RUE Sensation: WFL - Light Touch RUE Coordination: WFL - gross/fine motor Left Upper Extremity Assessment LUE ROM/Strength/Tone: Within functional levels LUE Sensation: WFL - Light Touch LUE Coordination: WFL - gross/fine motor Right Lower Extremity Assessment RLE ROM/Strength/Tone: Within functional levels RLE Sensation: WFL - Light Touch RLE Coordination: WFL - gross/fine motor Left Lower Extremity Assessment LLE ROM/Strength/Tone: Within functional levels LLE Sensation: WFL - Light Touch LLE Coordination: WFL - gross/fine motor Trunk Assessment Trunk Assessment: Normal   Balance Balance Balance Assessed: No  End of Session PT - End of Session Equipment Utilized During Treatment: Gait belt Activity Tolerance: Patient tolerated treatment well Patient left: in bed;with call bell/phone within reach;with bed alarm set Nurse Communication: Mobility status  GP     Cephus Shelling 07/20/2012, 10:03 AM  07/20/2012 Cephus Shelling, PT, DPT [phone]

## 2012-07-20 NOTE — Consult Note (Signed)
CARDIOLOGY CONSULT NOTE   Patient ID: Benjamin Noble MRN: [id number] DOB/AGE: Apr 20, 1922 78 y.o.  Admit date: 07/18/2012  Primary Physician   Cassell Clement, MD Primary Cardiologist   TB  Reason for Consultation   CHF  [id number] Benjamin Noble is a 78 y.o. male with a history of CAD.  He had a fall, possible vasovagal syncope in the setting of trying to move his bowels. EMS was called and he was initially hypotensive. He received fluid resuscitation with improvement in his blood pressure. Additionally, he was disimpacted and now feels generally better.  Benjamin Noble was otherwise in his usual state of health but was having a feeling of pressure and needing to have a bowel movement last p.m. He went to the bathroom and, while straining, began feeling worse, nauseated and weak. He leaned back against the wall to keep from falling. He woke up in the same position and does not know how that time had passed. He was able to call for help. There was no fall. He did not have chest pain or shortness of breath. He did not have palpitations. Constipation has been an ongoing problem for him but he has never had syncope in this setting before.  Since being disimpacted, he feels much better. He does not weigh himself because he is unable to stand on scale secondary to his balance problems. He ambulates with a walker and feels unsteady on his feet most of the time. He is compliant with his medications and feels that he eats a low sodium diet. They have 24-hour caregivers in the home but he feels they need to spend the vast majority of their time with his wife. Currently, he is resting comfortably, lying almost supine and denies shortness of breath, orthopnea, or PND.   Past Medical History  Diagnosis Date  . Ischemic heart disease     original CABG in 1984 with redo CABG in 1992; managed medically  . HTN (hypertension)   . Angina pectoris   . Hypercholesterolemia   . CHF (congestive heart  failure)     EF is 35 to 40% per echo April 2013  . Arthritis   . Urinary incontinence   . Myocardial infarction   . Dysrhythmia     irregular  heart beats per patient  . Asthma   . Shortness of breath   . Diabetes mellitus     type 2  . GERD (gastroesophageal reflux disease)   . Neuromuscular disorder     periferal neruropathy  . Depression   . Syncope   . Subarachnoid hemorrhage April 2013    from fall/coumadin & aspirin stopped  . Coronary artery disease   . Pacemaker   . Chronic kidney disease     hx of acute renal failure     Past Surgical History  Procedure Date  . Cardiac catheterization 03/18/1998  . Coronary artery bypass graft [id number]    first at [facility]  . Circumcision 02/05/08  . Eye surgery   . Cardiac stents   . Insert / replace / remove pacemaker 05/2012    Allergies  Allergen Reactions  . Penicillins Anaphylaxis and Swelling  . Lipitor (Atorvastatin Calcium) Other (See Comments)    Gi issues  . Macrodantin Other (See Comments)    Unknown   . Morphine And Related Other (See Comments)    "doesn't feel right"  . Ranolazine Er Other (See Comments)    constipation  . Statins Other (See Comments)  Leg weakness    I have reviewed the patient's current medications   . amiodarone  200 mg Oral Daily  . antiseptic oral rinse  15 mL Mouth Rinse BID  . aspirin EC  81 mg Oral Daily  . carvedilol  3.125 mg Oral BID WC  . docusate sodium  100 mg Oral BID  . enoxaparin (LOVENOX) injection  30 mg Subcutaneous Q24H  . fenofibrate  54 mg Oral Daily  . ferrous sulfate  325 mg Oral QODAY  . furosemide  60 mg Oral BID  . insulin aspart  0-9 Units Subcutaneous TID WC  . insulin glargine  14 Units Subcutaneous QHS  . isosorbide mononitrate  30 mg Oral BID  . levothyroxine  25 mcg Oral QAC breakfast  . pantoprazole  40 mg Oral Daily  . polyethylene glycol  17 g Oral BID  . sodium chloride  3 mL Intravenous Q12H      . DISCONTD: sodium chloride 20  mL (07/18/12 1856)   ALPRAZolam, bisacodyl, hydrALAZINE, nitroGLYCERIN, ondansetron (ZOFRAN) IV, ondansetron  Medication Sig  amiodarone (PACERONE) 200 MG tablet Take 1 tablet (200 mg total) by mouth daily.  aspirin EC 81 MG tablet Take 1 tablet (81 mg total) by mouth daily. Chest pain  carvedilol (COREG) 3.125 MG tablet Take 1 tablet (3.125 mg total) by mouth 2  times daily.  docusate sodium (COLACE) 100 MG capsule Take 1 capsule (100 mg total) by mouth daily.  fenofibrate (TRICOR) 48 MG tablet Take 1 tablet (48 mg total) by mouth daily.  ferrous sulfate 325 (65 FE) MG tablet Take 1 tablet (325 mg total) by mouth every other day.  furosemide (LASIX) 20 MG tablet Take 20 mg by mouth daily.   insulin glargine (LANTUS) 100 UNIT/ML injection Inject 14 Units into the skin at bedtime.   isosorbide mononitrate 30 MG 24 hr tablet Take 1 tablet (30 mg total) by mouth 2 (two) times daily.  levothyroxine (LEVOTHROID) 25 MCG tablet Take 1 tablet (25 mcg total) by mouth daily.  losartan (COZAAR) 100 MG tablet Take 50 mg by mouth daily.  pantoprazole (PROTONIX) 40 MG tablet Take 1 tablet (40 mg total) by mouth daily.  nitroGLYCERIN (NITROSTAT) 0.4 MG SL tablet Place 1 tablet (0.4 mg total) under the tongue every 5 (five) minutes as needed. For chest pain.     History   Social History  . Marital Status: Married    Spouse Name: N/A    Number of Children: N/A  . Years of Education: N/A   Occupational History  . Not on file.   Social History Main Topics  . Smoking status: Never Smoker   . Smokeless tobacco: Never Used  . Alcohol Use: No  . Drug Use: No  . Sexually Active: Not Currently   Other Topics Concern  . Not on file   Social History Narrative   The patient lives at home with his wife, who has alzheimer's.  They have a 24-hr sitter in the home, and close family contacts nearby.  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   Family Status  Relation Status Death Age  .  Mother Deceased   . Father Deceased    Family History  Problem Relation Age of Onset  . Heart attack Father      ROS: He is chronically very weak. His balance is very poor. He is very careful about what he does and uses the walker so he does not have a lot  of falls but is very unsteady. He denies recent lower extremity edema. He is compliant with diet and medications. He has not had recent chest pain. He is under a lot of stress because his wife's health is so poor. He has had no other recent illnesses, fevers or chills. Full 14 point review of systems complete and found to be negative unless listed above.  Physical Exam: Blood pressure 144/89, pulse 61, temperature 98.1 F (36.7 C), temperature source Oral, resp. rate 18, height 6\' 1"  (1.854 m), weight 165 lb 9.1 oz (75.1 kg), SpO2 94.00%.  General: Well developed, well nourished, male in no acute distress Head: Eyes PERRLA, No xanthomas.   Normocephalic and atraumatic, oropharynx without edema or exudate. Dentition: Poor Lungs: bilateral basilar rales but no crackles or wheeze noted Heart: HRRR S1 S2, no rub/gallop, no significant murmur. pulses are 2+ bilateral upper extrem., Decreased in both lower extrem.   Neck: Bilateral carotid bruits. No lymphadenopathy.  JVD slightly elevated. Abdomen: Bowel sounds present, abdomen soft and non-tender without masses or hernias noted. Msk:  No spine or cva tenderness. No weakness, no joint deformities or effusions. Extremities: No clubbing or cyanosis. No edema.  Neuro: Alert and oriented X 3. No focal deficits noted. Psych:  Good affect, responds appropriately Skin: No rashes or lesions noted.  Labs:   Lab Results  Component Value Date   WBC 6.7 07/19/2012   HGB 11.6* 07/19/2012   HCT 33.9* 07/19/2012   MCV 91.6 07/19/2012   PLT 228 07/19/2012    Basename 07/18/12 0230  INR 1.12    Lab 07/20/12 0615 07/19/12 0058 07/17/2012  NA 134* --  138   K 4.0 --  4.8   CL 96 --  101   CO2 28 --   29   BUN 30* --  36   CREATININE 1.77* --  2.14   CALCIUM 9.0 --   PROT -- 6.4   BILITOT -- 0.3   ALKPHOS -- 69   ALT -- 6   AST -- 11   GLUCOSE 140* --  224     Basename 07/19/12 0057 07/18/12 1723 07/18/12 1015 07/18/12 0231  CKTOTAL 27 31 34 29  CKMB 2.0 2.3 2.3 2.0  TROPONINI <0.30 <0.30 <0.30 <0.30   Pro B Natriuretic peptide (BNP)  Date/Time Value Range Status  07/20/2012  6:15 AM 5703.0* 0 - 450 pg/mL Final  07/18/2012 10:15 AM 2045.0* 0 - 450 pg/mL Final   TSH  Date/Time Value Range Status  07/18/2012 10:15 AM 6.086* 0.350 - 4.500 uIU/mL Final   Echo: 05/07/2012 Study Conclusions - Left ventricle: The cavity size was normal. Wall thickness was normal. Systolic function was severely reduced. The estimated ejection fraction was in the range of 25% to 30%. The posterior wall and apex were akinetic. The inferior and anterolateral walls were severely hypokinetic. Doppler parameters are consistent with restrictive physiology, indicative of decreased left ventricular diastolic compliance and/or increased left atrial pressure. E/medial e' > 15 suggests LV end diastolic pressure at least 20 mmHg. - Aortic valve: Sclerosis without stenosis. - Mitral valve: No significant regurgitation. - Left atrium: The atrium was moderately dilated. - Right ventricle: Poorly visualized. The cavity size was normal. Systolic function was mildly reduced. - Tricuspid valve: Peak RV-RA gradient: 39mm Hg (S). - Pulmonary arteries: PA peak pressure: 49mm Hg (S). - Systemic veins: IVC not visualized. Impressions: - Normal LV size with EF 25-30%. The posterior wall and apex were akinetic. The inferior wall and anterolateral  wall were severely hypokinetic. There was severe diastolic dysfunction with evidence for elevated LV filling pressure. The RV was poorly visualized but appeared normal in size with at least mild systolic dysfunction.  ECG:  0-JUL-2013 02:11:57  ATRIAL-VENTRICULAR  DUAL-PACED RHYTHM NO FURTHER ANALYSIS ATTEMPTED DUE TO PACED RHYTHM Vent. rate 60 BPM PR interval 189 ms QRS duration 160 ms QT/QTc 544/544 ms P-R-T axes -2 258 110  Radiology:  Dg Chest Port 1 View 07/18/2012  *RADIOLOGY REPORT*  Clinical Data: Syncope, shortness of breath.  PORTABLE CHEST - 1 VIEW  Comparison: 04/28/2012  Findings: Cardiomegaly.  Aortic arch atherosclerosis.  Status post median sternotomy.  Multiple fractured sternal wires again noted. Left lung base scarring/pleural thickening, similar to priors.  No new areas of consolidation.  No pneumothorax or definite pleural effusion.  No acute osseous finding. Left chest wall battery pack with lead tips unchanged in position on AP view.  IMPRESSION: Left lung base scarring is similar to prior.  Cardiomegaly without overt edema.  Original Report Authenticated By: Waneta Martins, M.D.    ASSESSMENT AND PLAN:   The patient was seen today by Dr Patty Sermons, the patient evaluated and the data reviewed.  Principal Problem:  *Acute on chronic systolic CHF (congestive heart failure) - Mr Noble was hypotensive on admission and had had a syncopal episode that was likely vasovagal in nature. He was hydrated and his blood pressure improved. At his last office visit on 05/29/2012, his weight was 168 pounds. His volume status was at baseline. His current weight is 165 pounds. His neck veins are up slightly but no crackles are appreciated on exam. Agree with restarting Lasix. It was restarted at 60 mg twice a day, consider restarting at his home dose of 20 mg daily. Continue to follow renal function closely. The patient feels he is unable to weigh himself at home because of other medical problems. He may benefit from a periodic office visit with the R.N. for weight and blood pressure check as well as lab work to follow his volume status more carefully.   Otherwise, per primary MD. Active Problems:  Hypertension  Diabetes mellitus type II   Syncope  CKD (chronic kidney disease), stage II  CAD (coronary artery disease)  Pacemaker-Medtronic  Hypotension  Hypothyroid  Constipation   Signed: Theodore Demark 07/20/2012, 10:20 AM Co-Sign MD Patient known to me.  Seen with Leanna Battles. The patient now appears to be back to his normal baseline.  Tendency toward dehydration at home has been an issue because the patient tends not to drink enough water according to the son. Agree would send home on a smaller dose of lasix 20 mg daily. At the present time exam reveals clear lungs and no peripheral edema. Heart reveals no gallop. Rhythm is regular and paced. Agree with above assessment and plan. He has an appointment already to see me next week in office.

## 2012-07-21 DIAGNOSIS — I209 Angina pectoris, unspecified: Secondary | ICD-10-CM

## 2012-07-21 DIAGNOSIS — F341 Dysthymic disorder: Secondary | ICD-10-CM

## 2012-07-21 LAB — BASIC METABOLIC PANEL
BUN: 35 mg/dL — ABNORMAL HIGH (ref 6–23)
Creatinine, Ser: 1.91 mg/dL — ABNORMAL HIGH (ref 0.50–1.35)
GFR calc non Af Amer: 30 mL/min — ABNORMAL LOW (ref >90)
Glucose, Bld: 189 mg/dL — ABNORMAL HIGH (ref 70–99)
Potassium: 4.1 mEq/L (ref 3.5–5.1)

## 2012-07-21 LAB — GLUCOSE, CAPILLARY: Glucose-Capillary: 211 mg/dL — ABNORMAL HIGH (ref 70–99)

## 2012-07-21 NOTE — Progress Notes (Signed)
[facility] PROGRESS NOTE  Andrey Mccaskill OZH:[id number] DOB: 06-05-1922 DOA: 07/18/2012 PCP: Cassell Clement, MD  Brief narrative: 78 year old white male with extensive past medical history of CAD status post pacemaker, hypothyroidism, chronic kidney disease and systolic congestive heart failure who presented 07/18/2012 status post syncopal event.  Assessment/Plan:   Principal Problem:  *Acute on chronic systolic CHF (congestive heart failure):   Patient was initially on lasix 60 mg twice daily, now per cardiology recommendation to continue lasix 20 mg daily  Patient instructed to follow up with cardiology in 1 week upon discharge and to have BMET rechecked  Continue coreg 3.125 mg BID, Imdur  Continue fenofibrate  Active Problems:  Hypertension:   BP 140/78  Continue regimen as above with coreg, Imdur, lasix  Blood pressure has nicely improved now that he has been better diuresed   Diabetes mellitus type II  CBG: 211, 149, 292  Continue glargin 14 units HS and sliding scale  Syncope:   Likely vasovagal  Feels better today  CKD (chronic kidney disease), stage II:   Creatinine 1.99 today, slightly up from yesterday  Likely due to diuretics  CAD (coronary artery disease):   Chronic. Stable  Pacemaker-Medtronic  Amiodarone 200 mg daily  Hypothyroidism:   TSH elevated at 6.086; likely due to acute illness  Plan to check it by PCP on an outpatient basis  For now continue synthroid   Constipation:   Continue colace and miralax  Depression:   Xanax PRN  Code Status: DNR DVT prophylaxis: lovenox subQ Family Communication: none at bedside Disposition Plan: D/C plan in am with HHPT  Danie Binder, MD  [facility] Pager [phone]  If 7PM-7AM, please contact night-coverage [email or web address] Password [email or web address] 07/21/2012, 3:56 PM   LOS: 3 days    Consultants:  cardiology  Procedures:  none  Antibiotics:  none  HPI/Subjective: No acute events overnight. This morning patient feels just a little weak.  Objective: Filed Vitals:   07/20/12 2119 07/21/12 0540 07/21/12 1121 07/21/12 1413  BP: 155/69 160/85 148/72 140/78  Pulse: 60 62 68 60  Temp: 98.9 F (37.2 C) 97.2 F (36.2 C)  98 F (36.7 C)  TempSrc: Oral Oral  Oral  Resp: 20 20  19   Height:      Weight:  163 lb 12.8 oz (74.3 kg)    SpO2: 94% 94%  96%    Intake/Output Summary (Last 24 hours) at 07/21/12 1556 Last data filed at 07/21/12 1417  Gross per 24 hour  Intake    240 ml  Output   1925 ml  Net  -1685 ml    Exam:   General:  Pt is alert, follows commands appropriately, not in acute distress  Cardiovascular: Regular rate and rhythm, S1/S2, no murmurs, no rubs, no gallops  Respiratory: Clear to auscultation bilaterally, no wheezing, no crackles, no rhonchi  Abdomen: Soft, non tender, non distended, bowel sounds present, no guarding  Extremities: trace LE edema, pulses DP and PT palpable bilaterally  Neuro: Grossly nonfocal  Data Reviewed: Basic Metabolic Panel:  Lab 07/21/12 1324 07/20/12 0615 07/19/12 0058 07/18/12 0230  NA 133* 134* 136 138  K 4.1 4.0 4.4 4.8  CL 96 96 102 101  CO2 28 28 26 29   GLUCOSE 189* 140* 197* 224*  BUN 35* 30* 28* 36*  CREATININE 1.91* 1.77* 1.63* 2.14*  CALCIUM 8.7 9.0 8.4 8.9   Liver Function Tests:  Lab 07/19/12 0058 07/18/12 0230  AST 11 14  ALT 6 6  ALKPHOS 69 72  BILITOT 0.3 0.3  PROT 6.4 7.1  ALBUMIN 2.7* 3.1*   CBC:  Lab 07/19/12 0058 07/18/12 1015 07/18/12 0230  WBC 6.7 9.4 11.4*  HGB 11.6* 12.1* 12.4*  HCT 33.9* 36.3* 36.8*  MCV 91.6 91.2 91.3  PLT 228 207 230   Cardiac Enzymes:  Lab 07/19/12 0057 07/18/12 1723 07/18/12 1015 07/18/12 0231  CKTOTAL 27 31 34 29  CKMB 2.0 2.3 2.3 2.0  TROPONINI <0.30 <0.30 <0.30 <0.30   CBG:  Lab 07/21/12 1048 07/21/12 0640 07/20/12 2118  07/20/12 1623 07/20/12 1051  GLUCAP 211* 149* 292* 196* 245*     Studies: No results found.  Scheduled Meds:   . amiodarone  200 mg Oral Daily  . aspirin EC  81 mg Oral Daily  . carvedilol  3.125 mg Oral BID WC  . docusate sodium  100 mg Oral BID  . (LOVENOX) injection  30 mg Subcutaneous Q24H  . fenofibrate  54 mg Oral Daily  . ferrous sulfate  325 mg Oral QODAY  . furosemide  20 mg Oral Daily  . insulin aspart  0-9 Units Subcutaneous TID WC  . insulin glargine  14 Units Subcutaneous QHS  . isosorbide mononitrate  30 mg Oral BID  . levothyroxine  25 mcg Oral QAC breakfast  . pantoprazole  40 mg Oral Daily  . polyethylene glycol  17 g Oral BID   Continuous Infusions:

## 2012-07-21 NOTE — Progress Notes (Signed)
Physical Therapy Treatment Patient Details Name: Benjamin Noble MRN: [id number] DOB: 05-Apr-1922 Today's Date: 07/21/2012 Time: 8295-6213 PT Time Calculation (min): 18 min  PT Assessment / Plan / Recommendation Comments on Treatment Session  Fatigues quickly with need for truncal stability assist once he fatigues.  Should be safe with 24 hour caregivers that also assist his wife    Follow Up Recommendations  Home health PT    Barriers to Discharge        Equipment Recommendations  None recommended by PT    Recommendations for Other Services    Frequency Min 3X/week   Plan      Precautions / Restrictions Restrictions Weight Bearing Restrictions: No   Pertinent Vitals/Pain     Mobility  Bed Mobility Bed Mobility: Supine to Sit Supine to Sit: 4: Min guard;With rails Details for Bed Mobility Assistance: up via R elbow with use of the rail and vc's for better hand placement Transfers Transfers: Sit to Stand;Stand to Sit Sit to Stand: 4: Min assist;With upper extremity assist;From bed Stand to Sit: 4: Min assist;With upper extremity assist;To toilet Details for Transfer Assistance: vc's for hand placement; assist to come forward to stand and to stay forward to control descent Ambulation/Gait Ambulation/Gait Assistance: 4: Min assist Ambulation Distance (Feet): 90 Feet Assistive device: Rolling walker Ambulation/Gait Assistance Details: stability assist at trunk to control R lateral pelvic shift due to truncal weakness and fatigue Gait Pattern: Step-through pattern;Decreased stride length;Wide base of support;Trunk flexed Stairs: No Wheelchair Mobility Wheelchair Mobility: No    Exercises     PT Diagnosis:    PT Problem List:   PT Treatment Interventions:     PT Goals Acute Rehab PT Goals Time For Goal Achievement: 07/27/12 Potential to Achieve Goals: Good PT Goal: Supine/Side to Sit - Progress: Progressing toward goal PT Goal: Sit to Stand - Progress:  Progressing toward goal PT Goal: Stand to Sit - Progress: Progressing toward goal PT Goal: Ambulate - Progress: Progressing toward goal  Visit Information  Last PT Received On: 07/21/12 Assistance Needed: +1    Subjective Data  Subjective: yeah, this is a long way for me. Patient Stated Goal: Go home.   Cognition  Overall Cognitive Status: Appears within functional limits for tasks assessed/performed Arousal/Alertness: Awake/alert Orientation Level: Appears intact for tasks assessed Behavior During Session: [facility] for tasks performed    Balance     End of Session PT - End of Session Equipment Utilized During Treatment: Gait belt Activity Tolerance: Patient tolerated treatment well;Patient limited by fatigue Patient left: Other (comment) (left on toilet with instructions to use call light) Nurse Communication: Mobility status   GP     Naftali Carchi, Eliseo Gum 07/21/2012, 2:43 PM  07/21/2012  [place] [facility], PT [phone] (pager)

## 2012-07-21 NOTE — Progress Notes (Signed)
Subjective:  Feels well anxious to go home.  Objective:  Vital Signs in the last 24 hours: Temp:  [97.2 F (36.2 C)-99.3 F (37.4 C)] 97.2 F (36.2 C) (08/02 0540) Pulse Rate:  [60-62] 62  (08/02 0540) Resp:  [19-20] 20  (08/02 0540) BP: (133-160)/(69-85) 160/85 mmHg (08/02 0540) SpO2:  [92 %-94 %] 94 % (08/02 0540) Weight:  [163 lb 12.8 oz (74.3 kg)] 163 lb 12.8 oz (74.3 kg) (08/02 0540)  Intake/Output from previous day: 08/01 0701 - 08/02 0700 In: 360 [P.O.:360] Out: 1950 [Urine:1950] Intake/Output from this shift:       . amiodarone  200 mg Oral Daily  . antiseptic oral rinse  15 mL Mouth Rinse BID  . aspirin EC  81 mg Oral Daily  . carvedilol  3.125 mg Oral BID WC  . docusate sodium  100 mg Oral BID  . enoxaparin (LOVENOX) injection  30 mg Subcutaneous Q24H  . fenofibrate  54 mg Oral Daily  . ferrous sulfate  325 mg Oral QODAY  . furosemide  20 mg Oral Daily  . insulin aspart  0-9 Units Subcutaneous TID WC  . insulin glargine  14 Units Subcutaneous QHS  . isosorbide mononitrate  30 mg Oral BID  . levothyroxine  25 mcg Oral QAC breakfast  . pantoprazole  40 mg Oral Daily  . polyethylene glycol  17 g Oral BID  . sodium chloride  3 mL Intravenous Q12H  . DISCONTD: furosemide  60 mg Oral BID      Physical Exam: The patient appears to be in no distress.  Head and neck exam reveals that the pupils are equal and reactive.  The extraocular movements are full.  There is no scleral icterus.  Mouth and pharynx are benign.  No lymphadenopathy.  No carotid bruits.  The jugular venous pressure is normal.  Thyroid is not enlarged or tender.  Chest is clear to percussion and auscultation.  No rales or rhonchi.  Expansion of the chest is symmetrical.  Heart reveals no abnormal lift or heave.  First and second heart sounds are normal.  There is no murmur gallop rub or click.  The abdomen is soft and nontender.  Bowel sounds are normoactive.  There is no hepatosplenomegaly  or mass.  There are no abdominal bruits.  Extremities reveal no phlebitis or edema.  Pedal pulses are good.  There is no cyanosis or clubbing.  Neurologic exam is normal strength and no lateralizing weakness.  No sensory deficits.  Integument reveals no rash  Lab Results:  Basename 07/19/12 0058 07/18/12 1015  WBC 6.7 9.4  HGB 11.6* 12.1*  PLT 228 207    Basename 07/21/12 0510 07/20/12 0615  NA 133* 134*  K 4.1 4.0  CL 96 96  CO2 28 28  GLUCOSE 189* 140*  BUN 35* 30*  CREATININE 1.91* 1.77*    Basename 07/19/12 0057 07/18/12 1723  TROPONINI <0.30 <0.30   Hepatic Function Panel  Basename 07/19/12 0058  PROT 6.4  ALBUMIN 2.7*  AST 11  ALT 6  ALKPHOS 69  BILITOT 0.3  BILIDIR --  IBILI --   No results found for this basename: CHOL in the last 72 hours No results found for this basename: PROTIME in the last 72 hours  Imaging: Imaging results have been reviewed  Cardiac Studies: Telemetry shows AV paced rhythm. Assessment/Plan:  Patient Active Hospital Problem List: Acute on chronic systolic CHF (congestive heart failure) (07/19/2012)   Assessment: Approaching baseline.  Renal  function has not turned the corner yet reflecting the higher doses of lasix given earlier in hospital course.   Plan: OK for discharge today on Lasix 20 mg daily.  He has appt with me next week and we can check BMET then.    LOS: 3 days    Cassell Clement 07/21/2012, 8:25 AM

## 2012-07-22 LAB — GLUCOSE, CAPILLARY: Glucose-Capillary: 207 mg/dL — ABNORMAL HIGH (ref 70–99)

## 2012-07-22 LAB — BASIC METABOLIC PANEL
BUN: 37 mg/dL — ABNORMAL HIGH (ref 6–23)
Creatinine, Ser: 2.02 mg/dL — ABNORMAL HIGH (ref 0.50–1.35)
GFR calc Af Amer: 32 mL/min — ABNORMAL LOW (ref >90)
GFR calc non Af Amer: 28 mL/min — ABNORMAL LOW (ref >90)
Glucose, Bld: 135 mg/dL — ABNORMAL HIGH (ref 70–99)

## 2012-07-22 LAB — CBC
HCT: 38.7 % — ABNORMAL LOW (ref 39.0–52.0)
Hemoglobin: 12.9 g/dL — ABNORMAL LOW (ref 13.0–17.0)
MCH: 30.1 pg (ref 26.0–34.0)
MCHC: 33.3 g/dL (ref 30.0–36.0)
MCV: 90.2 fL (ref 78.0–100.0)
RDW: 12.5 % (ref 11.5–15.5)

## 2012-07-22 MED ORDER — ALPRAZOLAM 0.25 MG PO TABS: 0.2500 mg | ORAL_TABLET | Freq: Two times a day (BID) | ORAL | Status: AC | PRN

## 2012-07-22 NOTE — Discharge Summary (Signed)
Physician Discharge Summary  Brenin Heidelberger YNW:[id number] DOB: 08/17/1922 DOA: 07/18/2012  PCP: Cassell Clement, MD  Admit date: 07/18/2012 Discharge date: 07/22/2012  Recommendations for Outpatient Follow-up:  1. Pt will need to follow up with PCP in 2-3 weeks post discharge 2. Please obtain BMP to evaluate electrolytes and kidney function 3. Please also check CBC to evaluate Hg and Hct levels 4. Pt will also need to have TSH checked as it was slightly above the target goal 5. Please note no change in synthroid dose was made, will defer to PCP   Discharge Diagnoses:  Principal Problem:  *Acute on chronic systolic CHF (congestive heart failure) Active Problems:  Hypertension  Diabetes mellitus type II  Syncope  CKD (chronic kidney disease), stage II  CAD (coronary artery disease)  Pacemaker-Medtronic  Hypotension  Hypothyroid  Constipation    Discharge Condition: Stable  Diet recommendation: Heart healthy diet discussed in details   History of present illness:  An 78 year old gentleman with multiple medical problems including coronary artery disease, chronic kidney disease who was brought in secondary to a syncopal episode while on the toilet at home. Patient was constipated apparently and was trying to go to the bathroom. He was found to be hypotensive by EMS with systolic in the 80s as well as cyanotic when they met him. He was given fluids and brought to the emergency room. He had prior history of syncopal episodes. He has arrhythmias with systolicc dysfunction. He has a pacemaker in place and his rhythm was paced at the time of admission. Patient was disimpacted manually in the ER for his severe obstipation.   Hospital Course:  Principal Problem:  *Acute on chronic systolic CHF (congestive heart failure):  Patient was initially on lasix 60 mg twice daily, now per cardiology recommendation to continue lasix 20 mg daily  Patient instructed to follow up with  cardiology in 1 week upon discharge and to have BMET rechecked  Continue coreg 3.125 mg BID, Imdur  Continue fenofibrate  Active Problems:  Hypertension:  BP 140/78  Continue regimen as above with coreg, Imdur, lasix  Blood pressure has nicely improved now that he has been better diuresed   Diabetes mellitus type II  CBG: 211, 149, 292  Continue glargin 14 units HS  Syncope:  Likely vasovagal  Feels better today PT evaluation done and no specific recommendation given at this time - pt has 24 hour supervision at home  CKD (chronic kidney disease), stage II:  Creatinine has remained stable and around pt baseline He will need to have it repeated in an outpatient setting   CAD (coronary artery disease):  Chronic. Stable Pacemaker-Medtronic  Amiodarone 200 mg daily  Hypothyroidism:  TSH elevated at 6.086; likely due to acute illness  Plan to check it by PCP on an outpatient basis  For now continue synthroid   Constipation:  Continue colace and miralax upon discharge and it was explained to the pt those were over the counter medications  Diet and fiber intake discussed as well  Depression:  Xanax PRN  Procedures/Studies: Dg Chest Port 1 View 07/18/2012    IMPRESSION:  Left lung base scarring is similar to prior.   Cardiomegaly without overt edema.   Consultations:  None  Antibiotics:  None  Discharge Exam: Filed Vitals:   07/22/12 0506  BP: 133/74  Pulse: 61  Temp: 97.4 F (36.3 C)  Resp: 18   Filed Vitals:   07/21/12 1121 07/21/12 1413 07/21/12 2100 07/22/12 0506  BP: 148/72  140/78 129/68 133/74  Pulse: 68 60 60 61  Temp:  98 F (36.7 C) 97.7 F (36.5 C) 97.4 F (36.3 C)  TempSrc:  Oral Oral Oral  Resp:  19 18 18   Height:      Weight:    165 lb 5.5 oz (75 kg)  SpO2:  96% 97% 96%    General: Pt is alert, follows commands appropriately, not in acute distress Cardiovascular: Regular rate and rhythm, S1/S2 +, no murmurs, no rubs, no  gallops Respiratory: Clear to auscultation bilaterally, no wheezing, no crackles, no rhonchi Abdominal: Soft, non tender, non distended, bowel sounds +, no guarding Extremities: no edema, no cyanosis, pulses palpable bilaterally DP and PT Neuro: Grossly nonfocal  Discharge Instructions  Discharge Orders    Future Appointments: Provider: Department: Dept Phone: Center:   07/28/2012 10:30 AM Marinus Maw, MD [facility] [phone] [facility]   08/14/2012 11:15 AM Cassell Clement, MD [facility] [phone] None   08/14/2012 11:30 AM [facility] [phone] [facility]     Future Orders Please Complete By Expires   Diet - low sodium heart healthy      Increase activity slowly        Medication List  As of 07/22/2012 10:32 AM   TAKE these medications         ALPRAZolam 0.25 MG tablet   Commonly known as: XANAX   Take 1 tablet (0.25 mg total) by mouth 2 (two) times daily as needed for anxiety.      amiodarone 200 MG tablet   Commonly known as: PACERONE   Take 1 tablet (200 mg total) by mouth daily.      aspirin EC 81 MG tablet   Take 1 tablet (81 mg total) by mouth daily. Chest pain      carvedilol 3.125 MG tablet   Commonly known as: COREG   Take 1 tablet (3.125 mg total) by mouth 2 (two) times daily.      docusate sodium 100 MG capsule   Commonly known as: COLACE   Take 1 capsule (100 mg total) by mouth daily.      fenofibrate 48 MG tablet   Commonly known as: TRICOR   Take 1 tablet (48 mg total) by mouth daily.      ferrous sulfate 325 (65 FE) MG tablet   Take 1 tablet (325 mg total) by mouth every other day.      furosemide 20 MG tablet   Commonly known as: LASIX   Take 20 mg by mouth daily.      insulin glargine 100 UNIT/ML injection   Commonly known as: LANTUS   Inject 14 Units into the skin at bedtime.      isosorbide mononitrate 30 MG 24 hr tablet   Commonly known as: IMDUR   Take 1 tablet (30 mg total) by mouth 2  (two) times daily.      levothyroxine 25 MCG tablet   Commonly known as: SYNTHROID, LEVOTHROID   Take 1 tablet (25 mcg total) by mouth daily.      losartan 100 MG tablet   Commonly known as: COZAAR   Take 50 mg by mouth daily.      nitroGLYCERIN 0.4 MG SL tablet   Commonly known as: NITROSTAT   Place 1 tablet (0.4 mg total) under the tongue every 5 (five) minutes as needed. For chest pain.      pantoprazole 40 MG tablet   Commonly known as: PROTONIX  Take 1 tablet (40 mg total) by mouth daily.           Follow-up Information    Follow up with Cassell Clement, MD in 2 weeks.   Contact information:   [address] [place] [address] [phone]          The results of significant diagnostics from this hospitalization (including imaging, microbiology, ancillary and laboratory) are listed below for reference.     Labs: Basic Metabolic Panel:  Lab 07/22/12 1324 07/21/12 0510 07/20/12 0615 07/19/12 0058 07/18/12 1015 07/18/12 0230  NA 134* 133* 134* 136 -- 138  K 4.3 4.1 4.0 4.4 -- 4.8  CL 96 96 96 102 -- 101  CO2 30 28 28 26  -- 29  GLUCOSE 135* 189* 140* 197* -- 224*  BUN 37* 35* 30* 28* -- 36*  CREATININE 2.02* 1.91* 1.77* 1.63* 1.74* --  CALCIUM 8.9 8.7 9.0 8.4 -- 8.9  MG -- -- -- -- -- --  PHOS -- -- -- -- -- --   Liver Function Tests:  Lab 07/19/12 0058 07/18/12 0230  AST 11 14  ALT 6 6  ALKPHOS 69 72  BILITOT 0.3 0.3  PROT 6.4 7.1  ALBUMIN 2.7* 3.1*    CBC:  Lab 07/22/12 0605 07/19/12 0058 07/18/12 1015 07/18/12 0230  WBC 8.0 6.7 9.4 11.4*  NEUTROABS -- -- -- 8.2*  HGB 12.9* 11.6* 12.1* 12.4*  HCT 38.7* 33.9* 36.3* 36.8*  MCV 90.2 91.6 91.2 91.3  PLT 256 228 207 230   Cardiac Enzymes:  Lab 07/19/12 0057 07/18/12 1723 07/18/12 1015 07/18/12 0231  CKTOTAL 27 31 34 29  CKMB 2.0 2.3 2.3 2.0  CKMBINDEX -- -- -- --  TROPONINI <0.30 <0.30 <0.30 <0.30   BNP: BNP (last 3 results)  Basename 07/20/12 0615 07/18/12  1015  PROBNP 5703.0* 2045.0*   CBG:  Lab 07/22/12 0617 07/21/12 2114 07/21/12 1554 07/21/12 1048 07/21/12 0640  GLUCAP 136* 151* 204* 211* 149*     SIGNED: Time coordinating discharge: Over 30 minutes  Debbora Presto, MD  [facility] 07/22/2012, 10:32 AM Pager [phone]  If 7PM-7AM, please contact night-coverage [email or web address] Password [email or web address]

## 2012-07-25 ENCOUNTER — Telehealth: Payer: Self-pay | Admitting: Cardiology

## 2012-07-25 NOTE — Telephone Encounter (Signed)
F/u call

## 2012-07-25 NOTE — Telephone Encounter (Signed)
Mailbox is full and cannot accept any messages at this time.

## 2012-07-25 NOTE — Telephone Encounter (Signed)
F/u   Please return call to [facility] [phone], pt was recently re-hospitalized and needs an order to to extend PT,  Pt has podiatry referral (Toe nail clipping, and right foot continues to roll over no feeling)

## 2012-07-25 NOTE — Telephone Encounter (Signed)
Left message for Benjamin Noble - ok to extend PT.  Requested she call back with any questions

## 2012-07-25 NOTE — Telephone Encounter (Signed)
Fu call [facility] calling back about nursing order

## 2012-07-26 NOTE — Telephone Encounter (Signed)
Orders given to restart nursing

## 2012-07-26 NOTE — Telephone Encounter (Signed)
Will forward to Dr. Yevonne Pax nurse--Melinda

## 2012-07-28 ENCOUNTER — Ambulatory Visit (INDEPENDENT_AMBULATORY_CARE_PROVIDER_SITE_OTHER): Payer: Medicare Other | Admitting: Internal Medicine

## 2012-07-28 ENCOUNTER — Encounter: Payer: Self-pay | Admitting: Internal Medicine

## 2012-07-28 VITALS — BP 118/60 | HR 60 | Ht 69.0 in | Wt 169.8 lb

## 2012-07-28 DIAGNOSIS — I509 Heart failure, unspecified: Secondary | ICD-10-CM

## 2012-07-28 DIAGNOSIS — I5023 Acute on chronic systolic (congestive) heart failure: Secondary | ICD-10-CM

## 2012-07-28 DIAGNOSIS — I495 Sick sinus syndrome: Secondary | ICD-10-CM

## 2012-07-28 DIAGNOSIS — Z950 Presence of cardiac pacemaker: Secondary | ICD-10-CM

## 2012-07-28 DIAGNOSIS — I454 Nonspecific intraventricular block: Secondary | ICD-10-CM

## 2012-07-28 LAB — PACEMAKER DEVICE OBSERVATION
AL AMPLITUDE: 2.8 mv
BAMS-0001: 150 {beats}/min
BATTERY VOLTAGE: 2.79 V
RV LEAD AMPLITUDE: 11.2 mv
RV LEAD THRESHOLD: 0.75 V
VENTRICULAR PACING PM: 78

## 2012-07-28 NOTE — Progress Notes (Signed)
HPI [name] returns today for followup. He is a very pleasant 78 year old man with symptomatic bradycardia, status post permanent pacemaker insertion. He was recently hospitalized with what sounds like severe constipation and partial bowel instruction. This resolved with conservative measures. He did not require surgery. Allergies  Allergen Reactions  . Penicillins Anaphylaxis and Swelling  . Lipitor (Atorvastatin Calcium) Other (See Comments)    Gi issues  . Macrodantin Other (See Comments)    Unknown   . Morphine And Related Other (See Comments)    "doesn't feel right"  . Ranolazine Er Other (See Comments)    constipation  . Statins Other (See Comments)    Leg weakness     Current Outpatient Prescriptions  Medication Sig Dispense Refill  . ALPRAZolam (XANAX) 0.25 MG tablet Take 1 tablet (0.25 mg total) by mouth 2 (two) times daily as needed for anxiety.  30 tablet  0  . amiodarone (PACERONE) 200 MG tablet Take 1 tablet (200 mg total) by mouth daily.  90 tablet  3  . aspirin EC 81 MG tablet Take 1 tablet (81 mg total) by mouth daily. Chest pain  90 tablet  3  . carvedilol (COREG) 3.125 MG tablet Take 1 tablet (3.125 mg total) by mouth 2 (two) times daily.  90 tablet  3  . docusate sodium (COLACE) 100 MG capsule Take 1 capsule (100 mg total) by mouth daily.  90 capsule  3  . fenofibrate (TRICOR) 48 MG tablet Take 1 tablet (48 mg total) by mouth daily.  90 tablet  3  . ferrous sulfate 325 (65 FE) MG tablet Take 1 tablet (325 mg total) by mouth every other day.  45 tablet  3  . furosemide (LASIX) 20 MG tablet Take 20 mg by mouth daily.       . insulin glargine (LANTUS) 100 UNIT/ML injection Inject 14 Units into the skin at bedtime. 14-18 Units as directed, based on sugar levels.      . isosorbide mononitrate (IMDUR) 30 MG 24 hr tablet Take 1 tablet (30 mg total) by mouth 2 (two) times daily.  180 tablet  3  . levothyroxine (LEVOTHROID) 25 MCG tablet Take 1 tablet (25 mcg total) by  mouth daily.  90 tablet  3  . losartan (COZAAR) 100 MG tablet Take 50 mg by mouth daily.      . nitroGLYCERIN (NITROSTAT) 0.4 MG SL tablet Place 1 tablet (0.4 mg total) under the tongue every 5 (five) minutes as needed. For chest pain.  100 tablet  3  . pantoprazole (PROTONIX) 40 MG tablet Take 1 tablet (40 mg total) by mouth daily.  90 tablet  3     Past Medical History  Diagnosis Date  . Ischemic heart disease     original CABG in 1984 with redo CABG in 1992; managed medically  . HTN (hypertension)   . Angina pectoris   . Hypercholesterolemia   . CHF (congestive heart failure)     EF is 35 to 40% per echo April 2013  . Arthritis   . Urinary incontinence   . Myocardial infarction   . Dysrhythmia     irregular  heart beats per patient  . Asthma   . Shortness of breath   . Diabetes mellitus     type 2  . GERD (gastroesophageal reflux disease)   . Neuromuscular disorder     periferal neruropathy  . Depression   . Syncope   . Subarachnoid hemorrhage April 2013  from fall/coumadin & aspirin stopped  . Coronary artery disease   . Pacemaker   . Chronic kidney disease     hx of acute renal failure    ROS:   All systems reviewed and negative except as noted in the HPI.   Past Surgical History  Procedure Date  . Cardiac catheterization 03/18/1998  . Coronary artery bypass graft 0981,1914    first at [facility]  . Circumcision 02/05/08  . Eye surgery   . Cardiac stents   . Insert / replace / remove pacemaker 05/2012     Family History  Problem Relation Age of Onset  . Heart attack Father      History   Social History  . Marital Status: Married    Spouse Name: Benjamin Noble    Number of Children: Benjamin Noble  . Years of Education: Benjamin Noble   Occupational History  . Not on file.   Social History Main Topics  . Smoking status: Never Smoker   . Smokeless tobacco: Never Used  . Alcohol Use: No  . Drug Use: No  . Sexually Active: Not Currently   Other Topics Concern  . Not  on file   Social History Narrative   The patient lives at home with his wife, who has alzheimer's.  They have a 24-hr sitter in the home, and close family contacts nearby.  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     BP 118/60  Pulse 60  Ht 5\' 9"  (1.753 m)  Wt 169 lb 12.8 oz (77.021 kg)  BMI 25.08 kg/m2  Physical Exam:  Well appearing elderly man, NAD HEENT: Unremarkable Neck:  No JVD, no thyromegally Lungs:  Clear with no wheezes, rales, or rhonchi. Well-healed pacemaker incision. HEART:  Regular rate rhythm, soft systolic murmurs, no rubs, no clicks Abd:  soft, positive bowel sounds, no organomegally, no rebound, no guarding Ext:  2 plus pulses, no edema, no cyanosis, no clubbing Skin:  No rashes no nodules Neuro:  CN II through XII intact, motor grossly intact  DEVICE  Normal device function.  See PaceArt for details.   Assess/Plan:

## 2012-07-28 NOTE — Assessment & Plan Note (Signed)
His congestive heart failure symptoms are class 2-3. I've encouraged the patient to reduce his salt intake, and continue his current medical therapy.

## 2012-07-28 NOTE — Assessment & Plan Note (Signed)
His Medtronic pacemaker is working normally. His incision appears well-healed. We'll plan to recheck in several months.

## 2012-07-28 NOTE — Patient Instructions (Signed)
Your physician wants you to follow-up in: May 2014 with Dr. Taylor. You will receive a reminder letter in the mail two months in advance. If you don't receive a letter, please call our office to schedule the follow-up appointment.  

## 2012-08-03 ENCOUNTER — Telehealth: Payer: Self-pay | Admitting: Cardiology

## 2012-08-03 NOTE — Telephone Encounter (Signed)
Brooke calling from pt's home stating pt is not doing well--not taking his meds, not eating correctly, thinks he has prostate cancer and generally very depressed--she wanted [place] to know as he has an upcoming appoint with dr Patty Sermons and she feels pt needs to be transferred to hospice--brooke wanted dr Patty Sermons to talk with him at appoint and possibly get him into hospice-advised i would pass message along to dr Lucienne Minks and his nurse Juliette Alcide

## 2012-08-03 NOTE — Telephone Encounter (Signed)
Pt thinks he has prostate cancer per pt he has swelling in private area sometime is incontinent and other time he can't go when he wants to and he talks about wanting to die and his mobility has declined really bad over the last month Benjamin Noble is wondering if we can call hospice in. Pt will be sitting and all of a sudden he will turn blue and they are concerned

## 2012-08-04 ENCOUNTER — Telehealth: Payer: Self-pay | Admitting: Cardiology

## 2012-08-04 NOTE — Telephone Encounter (Signed)
Spoke with son and [facility] nurse. She was with patient and will call back

## 2012-08-04 NOTE — Telephone Encounter (Signed)
Left message to call back  

## 2012-08-04 NOTE — Telephone Encounter (Signed)
Spoke with Amada Jupiter son and nurse, will wait until office visit for referral

## 2012-08-04 NOTE — Telephone Encounter (Signed)
Fu call Pt's son called and said that hospice is recommended by home health nurse

## 2012-08-04 NOTE — Telephone Encounter (Signed)
Talk to home health nurse and she wants to talk to you about some things that have been happening

## 2012-08-04 NOTE — Telephone Encounter (Signed)
We will discuss possible hospice referral at his next office visit

## 2012-08-07 ENCOUNTER — Telehealth: Payer: Self-pay | Admitting: Cardiology

## 2012-08-07 NOTE — Telephone Encounter (Signed)
Left a message to call back.

## 2012-08-07 NOTE — Telephone Encounter (Signed)
[facility] NEED[facility] ADDITIONAL ORDER FOR PT FOR 6-8 MORE WEEKS, PLS CALL

## 2012-08-08 NOTE — Telephone Encounter (Signed)
Left message to call back  

## 2012-08-08 NOTE — Telephone Encounter (Signed)
Never heard back. Have left a message for home health to call back

## 2012-08-08 NOTE — Telephone Encounter (Signed)
F/u  Calling back to speak with nurse. Aware nurse Juliette Alcide was off on yesterday.

## 2012-08-09 ENCOUNTER — Ambulatory Visit: Payer: Self-pay | Admitting: Cardiology

## 2012-08-09 DIAGNOSIS — I5023 Acute on chronic systolic (congestive) heart failure: Secondary | ICD-10-CM

## 2012-08-09 NOTE — Telephone Encounter (Signed)
Advised ok to continue therapy.  Per therapist patient is making a lot of progress

## 2012-08-14 ENCOUNTER — Encounter: Payer: Self-pay | Admitting: Cardiology

## 2012-08-14 ENCOUNTER — Other Ambulatory Visit (INDEPENDENT_AMBULATORY_CARE_PROVIDER_SITE_OTHER): Payer: Medicare Other

## 2012-08-14 ENCOUNTER — Ambulatory Visit (INDEPENDENT_AMBULATORY_CARE_PROVIDER_SITE_OTHER): Payer: Medicare Other | Admitting: Cardiology

## 2012-08-14 VITALS — BP 118/70 | HR 70 | Ht 69.0 in | Wt 167.0 lb

## 2012-08-14 DIAGNOSIS — R0989 Other specified symptoms and signs involving the circulatory and respiratory systems: Secondary | ICD-10-CM

## 2012-08-14 DIAGNOSIS — E039 Hypothyroidism, unspecified: Secondary | ICD-10-CM

## 2012-08-14 DIAGNOSIS — E119 Type 2 diabetes mellitus without complications: Secondary | ICD-10-CM

## 2012-08-14 DIAGNOSIS — E7800 Pure hypercholesterolemia, unspecified: Secondary | ICD-10-CM

## 2012-08-14 DIAGNOSIS — E032 Hypothyroidism due to medicaments and other exogenous substances: Secondary | ICD-10-CM

## 2012-08-14 DIAGNOSIS — Z8679 Personal history of other diseases of the circulatory system: Secondary | ICD-10-CM

## 2012-08-14 DIAGNOSIS — I2510 Atherosclerotic heart disease of native coronary artery without angina pectoris: Secondary | ICD-10-CM

## 2012-08-14 DIAGNOSIS — I10 Essential (primary) hypertension: Secondary | ICD-10-CM

## 2012-08-14 LAB — CBC WITH DIFFERENTIAL/PLATELET
Basophils Absolute: 0.1 10*3/uL (ref 0.0–0.1)
Eosinophils Absolute: 1 10*3/uL — ABNORMAL HIGH (ref 0.0–0.7)
Hemoglobin: 12.6 g/dL — ABNORMAL LOW (ref 13.0–17.0)
Lymphocytes Relative: 11.9 % — ABNORMAL LOW (ref 12.0–46.0)
MCHC: 33 g/dL (ref 30.0–36.0)
Monocytes Relative: 8.8 % (ref 3.0–12.0)
Neutro Abs: 5 10*3/uL (ref 1.4–7.7)
Neutrophils Relative %: 65.7 % (ref 43.0–77.0)
RDW: 13.3 % (ref 11.5–14.6)

## 2012-08-14 LAB — BASIC METABOLIC PANEL
CO2: 28 mEq/L (ref 19–32)
Calcium: 8.8 mg/dL (ref 8.4–10.5)
Chloride: 101 mEq/L (ref 96–112)
Glucose, Bld: 93 mg/dL (ref 70–99)
Potassium: 4.5 mEq/L (ref 3.5–5.1)
Sodium: 137 mEq/L (ref 135–145)

## 2012-08-14 LAB — TSH: TSH: 8.81 u[IU]/mL — ABNORMAL HIGH (ref 0.35–5.50)

## 2012-08-14 LAB — LIPID PANEL
HDL: 40 mg/dL (ref >39.00)
Triglycerides: 67 mg/dL (ref 0.0–149.0)
VLDL: 13.4 mg/dL (ref 0.0–40.0)

## 2012-08-14 LAB — T4, FREE: Free T4: 1.05 ng/dL (ref 0.60–1.60)

## 2012-08-14 LAB — HEPATIC FUNCTION PANEL
Albumin: 3.4 g/dL — ABNORMAL LOW (ref 3.5–5.2)
Total Bilirubin: 0.3 mg/dL (ref 0.3–1.2)

## 2012-08-14 LAB — HEMOGLOBIN A1C: Hgb A1c MFr Bld: 8.8 % — ABNORMAL HIGH (ref 4.6–6.5)

## 2012-08-14 NOTE — Progress Notes (Signed)
Benjamin Noble Date of Birth:  1922/07/28 [facility] [address] [place], [place]  [address] [phone]  Fax   [phone]  HPI: This pleasant 78 year old gentleman is seen for a scheduled 3 month followup office visit. The patient has a history of ischemic heart disease. He has had coronary artery bypass graft surgery twice, most recently in 1992 which was a redo. He has chronic angina that is managed medically. He has LV dysfunction hypertension diabetes mellitus and GERD. He was recently found to have prolonged pauses on Holter monitoring and socially underwent a pacemaker implantation successfully. Since last visit the patient has had no prolonged chest pain.  He does have a history of overall generalized weakness and shortness of breath.  He is having more difficulty performing the activities of daily living.  His wife is under hospice and we are going to request hospice for him as well, at the request of his son who is the major caregiver for both of his parents.  Current Outpatient Prescriptions  Medication Sig Dispense Refill  . ALPRAZolam (XANAX) 0.25 MG tablet Take 1 tablet (0.25 mg total) by mouth 2 (two) times daily as needed for anxiety.  30 tablet  0  . amiodarone (PACERONE) 200 MG tablet Take 1 tablet (200 mg total) by mouth daily.  90 tablet  3  . aspirin EC 81 MG tablet Take 1 tablet (81 mg total) by mouth daily. Chest pain  90 tablet  3  . carvedilol (COREG) 3.125 MG tablet Take 1 tablet (3.125 mg total) by mouth 2 (two) times daily.  90 tablet  3  . docusate sodium (COLACE) 100 MG capsule Take 1 capsule (100 mg total) by mouth daily.  90 capsule  3  . fenofibrate (TRICOR) 48 MG tablet Take 1 tablet (48 mg total) by mouth daily.  90 tablet  3  . ferrous sulfate 325 (65 FE) MG tablet Take 1 tablet (325 mg total) by mouth every other day.  45 tablet  3  . furosemide (LASIX) 20 MG tablet Take 20 mg by mouth daily.       . insulin glargine  (LANTUS) 100 UNIT/ML injection Inject 14 Units into the skin at bedtime. 14-18 Units as directed, based on sugar levels.      . isosorbide mononitrate (IMDUR) 30 MG 24 hr tablet Take 1 tablet (30 mg total) by mouth 2 (two) times daily.  180 tablet  3  . levothyroxine (LEVOTHROID) 25 MCG tablet Take 1 tablet (25 mcg total) by mouth daily.  90 tablet  3  . losartan (COZAAR) 100 MG tablet Take 50 mg by mouth daily.      . nitroGLYCERIN (NITROSTAT) 0.4 MG SL tablet Place 1 tablet (0.4 mg total) under the tongue every 5 (five) minutes as needed. For chest pain.  100 tablet  3  . pantoprazole (PROTONIX) 40 MG tablet Take 1 tablet (40 mg total) by mouth daily.  90 tablet  3    Allergies  Allergen Reactions  . Penicillins Anaphylaxis and Swelling  . Lipitor (Atorvastatin Calcium) Other (See Comments)    Gi issues  . Macrodantin Other (See Comments)    Unknown   . Morphine And Related Other (See Comments)    "doesn't feel right"  . Ranolazine Er Other (See Comments)    constipation  . Statins Other (See Comments)    Leg weakness    Patient Active Problem List  Diagnosis  . Hypertension  . S/P CABG (coronary  artery bypass graft)  . Diabetes mellitus type II  . Hypercholesterolemia  . Anxiety associated with depression  . Angina pectoris  . Syncope  . Subarachnoid hemorrhage  . CKD (chronic kidney disease), stage II  . CAD (coronary artery disease)  . Bundle branch block  . Wide-complex tachycardia  . Tachy-brady syndrome  . Acute myocardial infarction, subendocardial infarction, initial episode of care  . Pacemaker-Medtronic  . Hypotension  . Acute on chronic systolic CHF (congestive heart failure)  . Hypothyroid  . Constipation    History  Smoking status  . Never Smoker   Smokeless tobacco  . Never Used    History  Alcohol Use No    Family History  Problem Relation Age of Onset  . Heart attack Father     Review of Systems: The patient denies any heat or cold  intolerance.  No weight gain or weight loss.  The patient denies headaches or blurry vision.  There is no cough or sputum production.  The patient denies dizziness.  There is no hematuria or hematochezia.  The patient denies any muscle aches or arthritis.  The patient denies any rash.  The patient denies frequent falling or instability.  There is no history of depression or anxiety.  All other systems were reviewed and are negative.   Physical Exam: Filed Vitals:   08/14/12 1123  BP: 118/70  Pulse: 70   the general appearance reveals a elderly gentleman in a wheelchair.The head and neck exam reveals pupils equal and reactive.  Extraocular movements are full.  There is no scleral icterus.  The mouth and pharynx are normal.  The neck is supple.  The carotids reveal no bruits.  The jugular venous pressure is normal.  The  thyroid is not enlarged.  There is no lymphadenopathy.  The chest is clear to percussion and auscultation.  There are no rales or rhonchi.  Expansion of the chest is symmetrical.  The precordium is quiet.  The first heart sound is normal.  The second heart sound is physiologically split.  There is no murmur gallop rub or click.  There is no abnormal lift or heave.  The abdomen is soft and nontender.  The bowel sounds are normal.  The liver and spleen are not enlarged.  There are no abdominal masses.  There are no abdominal bruits.  Extremities reveal good pedal pulses.  There is no phlebitis or edema.  There is no cyanosis or clubbing.  Strength is normal and symmetrical in all extremities.  There is no lateralizing weakness.  There are no sensory deficits.  The skin is warm and dry.  There is no rash.      Assessment / Plan: Continue same medication.  Recheck in 3 months for followup office visit CBC basal metabolic panel and lipid panel. We will ask hospice to start seeing him.

## 2012-08-14 NOTE — Assessment & Plan Note (Signed)
No headaches.  No profound dizzy spells or syncope.

## 2012-08-14 NOTE — Assessment & Plan Note (Signed)
The patient is not very compliant about his diabetic diet.  We are checking a hemoglobin A1c today.

## 2012-08-14 NOTE — Patient Instructions (Addendum)
Will obtain labs today and call you with the results   Your physician recommends that you continue on your current medications as directed. Please refer to the Current Medication list given to you today.  Your physician recommends that you schedule a follow-up appointment in: 3 months with fasting labs (LP/BMET/HFPCBC)

## 2012-08-14 NOTE — Assessment & Plan Note (Signed)
The patient has not had any recent worsening of his chronic angina pectoris

## 2012-08-17 ENCOUNTER — Telehealth: Payer: Self-pay | Admitting: Cardiology

## 2012-08-17 DIAGNOSIS — E039 Hypothyroidism, unspecified: Secondary | ICD-10-CM

## 2012-08-17 MED ORDER — LEVOTHYROXINE SODIUM 50 MCG PO TABS: 50.0000 ug | ORAL_TABLET | Freq: Every day | ORAL | Status: DC

## 2012-08-17 NOTE — Telephone Encounter (Signed)
Message copied by Burnell Blanks on Thu Aug 17, 2012  5:01 PM ------      Message from: Cassell Clement      Created: Thu Aug 17, 2012 12:40 PM       Increase Synthroid to 50 mcg daily

## 2012-08-17 NOTE — Telephone Encounter (Signed)
Message copied by Burnell Blanks on Thu Aug 17, 2012  9:23 AM ------      Message from: Cassell Clement      Created: Mon Aug 14, 2012  6:25 PM       The patient's thyroid is slightly underactive now.  Decrease amiodarone to 100 mg daily and  begin Synthroid 25 mcg daily.  Recheck a TSH in 6 weeks      The other labs are stable.  His hemoglobin A1c is still high and he needs to be more careful with sweets

## 2012-08-17 NOTE — Telephone Encounter (Signed)
Pt son returning nurse call, he can be reached at [phone]

## 2012-08-22 NOTE — Telephone Encounter (Signed)
Synthroid increase to 50 mcg since patient already taking 25 mcg. Advised son, sent Rx to [facility] and scheduled follow up labs

## 2012-08-23 NOTE — Telephone Encounter (Signed)
Orders placed for labs

## 2012-08-25 ENCOUNTER — Other Ambulatory Visit: Payer: Self-pay | Admitting: Cardiology

## 2012-08-25 DIAGNOSIS — F419 Anxiety disorder, unspecified: Secondary | ICD-10-CM

## 2012-08-28 ENCOUNTER — Telehealth: Payer: Self-pay | Admitting: Cardiology

## 2012-08-28 NOTE — Telephone Encounter (Signed)
Pt's son calling re refill  needed increased to 2 tabs a day for synthroid , to [facility], number should be on file, if not has the number to the [place] office is [phone] and they can get it for you

## 2012-08-31 ENCOUNTER — Other Ambulatory Visit: Payer: Self-pay | Admitting: Cardiology

## 2012-09-01 ENCOUNTER — Encounter (HOSPITAL_COMMUNITY): Payer: Self-pay | Admitting: Emergency Medicine

## 2012-09-01 ENCOUNTER — Emergency Department (HOSPITAL_COMMUNITY)
Admission: EM | Admit: 2012-09-01 | Discharge: 2012-09-01 | Disposition: A | Discharge status: Home / Self Care | Attending: Emergency Medicine | Admitting: Emergency Medicine

## 2012-09-01 DIAGNOSIS — I2510 Atherosclerotic heart disease of native coronary artery without angina pectoris: Secondary | ICD-10-CM | POA: Insufficient documentation

## 2012-09-01 DIAGNOSIS — K219 Gastro-esophageal reflux disease without esophagitis: Secondary | ICD-10-CM | POA: Insufficient documentation

## 2012-09-01 DIAGNOSIS — E7800 Pure hypercholesterolemia, unspecified: Secondary | ICD-10-CM | POA: Insufficient documentation

## 2012-09-01 DIAGNOSIS — I509 Heart failure, unspecified: Secondary | ICD-10-CM | POA: Insufficient documentation

## 2012-09-01 DIAGNOSIS — Z794 Long term (current) use of insulin: Secondary | ICD-10-CM | POA: Insufficient documentation

## 2012-09-01 DIAGNOSIS — Z950 Presence of cardiac pacemaker: Secondary | ICD-10-CM | POA: Insufficient documentation

## 2012-09-01 DIAGNOSIS — N189 Chronic kidney disease, unspecified: Secondary | ICD-10-CM | POA: Insufficient documentation

## 2012-09-01 DIAGNOSIS — K5641 Fecal impaction: Secondary | ICD-10-CM | POA: Insufficient documentation

## 2012-09-01 DIAGNOSIS — Z79899 Other long term (current) drug therapy: Secondary | ICD-10-CM | POA: Insufficient documentation

## 2012-09-01 DIAGNOSIS — M129 Arthropathy, unspecified: Secondary | ICD-10-CM | POA: Insufficient documentation

## 2012-09-01 DIAGNOSIS — I252 Old myocardial infarction: Secondary | ICD-10-CM | POA: Insufficient documentation

## 2012-09-01 DIAGNOSIS — I129 Hypertensive chronic kidney disease with stage 1 through stage 4 chronic kidney disease, or unspecified chronic kidney disease: Secondary | ICD-10-CM | POA: Insufficient documentation

## 2012-09-01 NOTE — ED Notes (Addendum)
blader scan showed 398 ml urine in bladder.  Pt just told RN a few minutes ago that he has started to have urinary retention due to fecal impaction.  Pt states he has been able to pass urine, but feels it is still there and will get better with disimpaction.  EDP room informed that pt has been waiting an hour.  Pt states that he has abd pain, although he denied pain at triage

## 2012-09-01 NOTE — ED Notes (Signed)
ZOX:[id number]<AV> Expected date:09/01/12<BR> Expected time:<BR> Means of arrival:<BR> Comments:<BR> EMS

## 2012-09-01 NOTE — ED Notes (Signed)
Pt states he has not had a bowel movement in 4-5 days.  Pt states he has tried prune juice with no success.  Pt at [facility] a month ago for impacted bowel.  Pt does not complain of any pain.  Pt states he just feels like he has to use the bathroom

## 2012-09-01 NOTE — ED Provider Notes (Signed)
History     CSN: [id number]  Arrival date & time 09/01/12  1316   First MD Initiated Contact with Patient 09/01/12 1504      Chief Complaint  Patient presents with  . Fecal Impaction  . Abdominal Pain  . Urinary Retention    (Consider location/radiation/quality/duration/timing/severity/associated sxs/prior treatment) Patient is a 78 y.o. male presenting with abdominal pain. The history is provided by the patient.  Abdominal Pain The primary symptoms of the illness include abdominal pain. The primary symptoms of the illness do not include fever, shortness of breath, nausea, vomiting, diarrhea or dysuria. Primary symptoms comment: rectal pain and constipation Episode onset: 4-5 days. The onset of the illness was gradual. The problem has been gradually worsening.  Pain Location: rectal pain. The abdominal pain does not radiate. The severity of the abdominal pain is 5/10. Relieved by: feels like he needs to have a bowel movement.  Associated with: nothing. The patient has had a change in bowel habit. Additional symptoms associated with the illness include constipation. Symptoms associated with the illness do not include urgency, hematuria, frequency or back pain. Associated medical issues comments: constipation.    Past Medical History  Diagnosis Date  . Ischemic heart disease     original CABG in 1984 with redo CABG in 1992; managed medically  . HTN (hypertension)   . Angina pectoris   . Hypercholesterolemia   . CHF (congestive heart failure)     EF is 35 to 40% per echo April 2013  . Arthritis   . Urinary incontinence   . Myocardial infarction   . Dysrhythmia     irregular  heart beats per patient  . Asthma   . Shortness of breath   . Diabetes mellitus     type 2  . GERD (gastroesophageal reflux disease)   . Neuromuscular disorder     periferal neruropathy  . Depression   . Syncope   . Subarachnoid hemorrhage April 2013    from fall/coumadin & aspirin stopped  .  Coronary artery disease   . Pacemaker   . Chronic kidney disease     hx of acute renal failure    Past Surgical History  Procedure Date  . Cardiac catheterization 03/18/1998  . Coronary artery bypass graft 4098,1191    first at [facility]  . Circumcision 02/05/08  . Eye surgery   . Cardiac stents   . Insert / replace / remove pacemaker 05/2012    Family History  Problem Relation Age of Onset  . Heart attack Father     History  Substance Use Topics  . Smoking status: Never Smoker   . Smokeless tobacco: Never Used  . Alcohol Use: No      Review of Systems  Constitutional: Negative for fever.  Respiratory: Negative for shortness of breath.   Gastrointestinal: Positive for abdominal pain and constipation. Negative for nausea, vomiting and diarrhea.  Genitourinary: Negative for dysuria, urgency, frequency and hematuria.  Musculoskeletal: Negative for back pain.  All other systems reviewed and are negative.    Allergies  Penicillins; Lipitor; Macrodantin; Morphine and related; Ranolazine er; and Statins  Home Medications   Current Outpatient Rx  Name Route Sig Dispense Refill  . ALPRAZOLAM 0.25 MG PO TABS Oral Take 0.25 mg by mouth 2 (two) times daily as needed. Anxiety    . AMIODARONE HCL 200 MG PO TABS Oral Take 100 mg by mouth daily. Take 1/2 tablet (100 mg) daily    . ASPIRIN EC 81  MG PO TBEC Oral Take 1 tablet (81 mg total) by mouth daily. Chest pain 90 tablet 3  . CARVEDILOL 3.125 MG PO TABS Oral Take 1 tablet (3.125 mg total) by mouth 2 (two) times daily. 90 tablet 3    THIS IS A NEW DOSE  . DOCUSATE SODIUM 100 MG PO CAPS Oral Take 1 capsule (100 mg total) by mouth daily. 90 capsule 3  . FENOFIBRATE 48 MG PO TABS Oral Take 1 tablet (48 mg total) by mouth daily. 90 tablet 3  . FERROUS SULFATE 325 (65 FE) MG PO TABS Oral Take 1 tablet (325 mg total) by mouth every other day. 45 tablet 3  . FUROSEMIDE 20 MG PO TABS Oral Take 20 mg by mouth daily.     . INSULIN  GLARGINE 100 UNIT/ML [place] SOLN Subcutaneous Inject 14 Units into the skin at bedtime. 14-18 Units as directed, based on sugar levels.    . ISOSORBIDE MONONITRATE ER 30 MG PO TB24 Oral Take 1 tablet (30 mg total) by mouth 2 (two) times daily. 180 tablet 3  . LEVOTHYROXINE SODIUM 50 MCG PO TABS Oral Take 50 mcg by mouth daily.    Marland Kitchen LOSARTAN POTASSIUM 100 MG PO TABS Oral Take 50 mg by mouth daily. Take 1/2 tablet (50 mg) daily    . NITROGLYCERIN 0.4 MG SL SUBL Sublingual Place 1 tablet (0.4 mg total) under the tongue every 5 (five) minutes as needed. For chest pain. 100 tablet 3  . PANTOPRAZOLE SODIUM 40 MG PO TBEC Oral Take 1 tablet (40 mg total) by mouth daily. 90 tablet 3    BP 210/96  Pulse 62  Temp 98.4 F (36.9 C) (Oral)  Resp 18  SpO2 99%  Physical Exam  Nursing note and vitals reviewed. Constitutional: He is oriented to person, place, and time. He appears well-developed and well-nourished. He appears distressed.  HENT:  Head: Normocephalic and atraumatic.  Mouth/Throat: Oropharynx is clear and moist.  Eyes: Conjunctivae normal and EOM are normal. Pupils are equal, round, and reactive to light.  Neck: Normal range of motion. Neck supple.  Cardiovascular: Normal rate, regular rhythm and intact distal pulses.   No murmur heard. Pulmonary/Chest: Effort normal and breath sounds normal. No respiratory distress. He has no wheezes. He has no rales.  Abdominal: Soft. He exhibits no distension. There is no tenderness. There is no rebound and no guarding.  Genitourinary:       Large amt of stool in the rectum that is hard and removed with some bright red blood  Musculoskeletal: Normal range of motion. He exhibits no edema and no tenderness.  Neurological: He is alert and oriented to person, place, and time.  Skin: Skin is warm and dry. No rash noted. No erythema.  Psychiatric: He has a normal mood and affect. His behavior is normal.    ED Course  Procedures (including critical care  time)  Labs Reviewed - No data to display No results found.   1. Fecal impaction       MDM   Do to fecal impaction today. He states he has not been unable to have a bowel movement for the last 4-5 days despite taking magnesium citrate and Dulcolax. He denies any true abdominal pain but states he has to go to the bathroom so badly he is having difficulty urinating. Bedside bladder scan shows a level of 350 cc in the bladder. Patient has no abdominal pain on palpation and normal vital signs.  Impaction performed with  a large amount of stool. Patient was able to urinate after and feels much better. He was instructed to take MiraLAX daily and follow up with PCP        Gwyneth Sprout, MD 09/01/12 [phone]

## 2012-09-01 NOTE — ED Notes (Addendum)
Pt states that he wants to leave prior to being seen.  Had previously told EDP room 10 minutes ago.  Told md will see him in a few minutes.  Charge RN updated, and spoke to pt's son.  MD made aware.

## 2012-09-18 MED ORDER — LEVOTHYROXINE SODIUM 50 MCG PO TABS: 50.0000 ug | ORAL_TABLET | Freq: Every day | ORAL | Status: DC

## 2012-09-18 NOTE — Telephone Encounter (Signed)
faxed

## 2012-09-26 ENCOUNTER — Telehealth: Payer: Self-pay | Admitting: Cardiology

## 2012-09-26 NOTE — Telephone Encounter (Signed)
Left message to call back  

## 2012-09-26 NOTE — Telephone Encounter (Signed)
New problem:  Patient did not want to disclose any information to me . Told me to mind my business. Aware that Dr. Patty Sermons is off today

## 2012-09-26 NOTE — Telephone Encounter (Signed)
F/U  Per son calling back - Benjamin Noble

## 2012-09-27 NOTE — Telephone Encounter (Signed)
Patient thinks that Coreg 3.125 is still causing him some problems, had decreased previously. Patient wants to stop Coreg altogether. He is having problems with weakness in his legs, sharp pain in feet at times (son thinks secondary to diabetes), and feeling dizzy at times.  Patient and son aware  Dr. Patty Sermons out of the office until next week.  Will forward to him for review.

## 2012-09-27 NOTE — Telephone Encounter (Signed)
Okay to leave off coreg.

## 2012-09-28 ENCOUNTER — Telehealth: Payer: Self-pay | Admitting: Cardiology

## 2012-09-28 NOTE — Telephone Encounter (Signed)
Hospice needs faxed orders sent to them , standing order, they are past due in out of compliance, pls send asap

## 2012-09-28 NOTE — Telephone Encounter (Signed)
This should have been faxed already, will follow up

## 2012-10-02 ENCOUNTER — Other Ambulatory Visit (INDEPENDENT_AMBULATORY_CARE_PROVIDER_SITE_OTHER): Payer: Medicare Other

## 2012-10-02 DIAGNOSIS — E039 Hypothyroidism, unspecified: Secondary | ICD-10-CM

## 2012-10-02 LAB — TSH: TSH: 6.94 u[IU]/mL — ABNORMAL HIGH (ref 0.35–5.50)

## 2012-10-02 NOTE — Telephone Encounter (Signed)
re-faxed

## 2012-10-02 NOTE — Telephone Encounter (Signed)
Advised patient sons ok to stop

## 2012-10-06 ENCOUNTER — Telehealth: Payer: Self-pay | Admitting: *Deleted

## 2012-10-06 DIAGNOSIS — E039 Hypothyroidism, unspecified: Secondary | ICD-10-CM

## 2012-10-06 MED ORDER — LEVOTHYROXINE SODIUM 75 MCG PO TABS: 75.0000 ug | ORAL_TABLET | Freq: Every day | ORAL | Status: AC

## 2012-10-06 NOTE — Telephone Encounter (Signed)
Message copied by Burnell Blanks on Fri Oct 06, 2012  9:39 AM ------      Message from: Cassell Clement      Created: Mon Oct 02, 2012  8:31 PM       Thyroid is still slightly underactive. Increase synthroid to 75 mcg daily.

## 2012-10-06 NOTE — Telephone Encounter (Signed)
Advised son and mailed Rx

## 2012-11-13 ENCOUNTER — Ambulatory Visit (INDEPENDENT_AMBULATORY_CARE_PROVIDER_SITE_OTHER): Payer: Medicare Other | Admitting: Cardiology

## 2012-11-13 ENCOUNTER — Other Ambulatory Visit (INDEPENDENT_AMBULATORY_CARE_PROVIDER_SITE_OTHER): Payer: Medicare Other

## 2012-11-13 ENCOUNTER — Encounter: Payer: Self-pay | Admitting: Cardiology

## 2012-11-13 VITALS — BP 130/70 | HR 69 | Resp 18 | Ht 69.0 in | Wt 165.0 lb

## 2012-11-13 DIAGNOSIS — I10 Essential (primary) hypertension: Secondary | ICD-10-CM

## 2012-11-13 DIAGNOSIS — Z8679 Personal history of other diseases of the circulatory system: Secondary | ICD-10-CM

## 2012-11-13 DIAGNOSIS — E039 Hypothyroidism, unspecified: Secondary | ICD-10-CM

## 2012-11-13 DIAGNOSIS — I119 Hypertensive heart disease without heart failure: Secondary | ICD-10-CM

## 2012-11-13 DIAGNOSIS — R0989 Other specified symptoms and signs involving the circulatory and respiratory systems: Secondary | ICD-10-CM

## 2012-11-13 DIAGNOSIS — E7800 Pure hypercholesterolemia, unspecified: Secondary | ICD-10-CM

## 2012-11-13 DIAGNOSIS — I495 Sick sinus syndrome: Secondary | ICD-10-CM

## 2012-11-13 NOTE — Assessment & Plan Note (Signed)
The patient has not been experiencing any hypoglycemic episodes. 

## 2012-11-13 NOTE — Progress Notes (Signed)
Benjamin Noble Date of Birth:  [birth year] [facility] [address] [place], [place]  [address] [phone]  Fax   [phone]  HPI: This pleasant 78 year old gentleman is seen for a scheduled 3 month followup office visit. The patient has a history of ischemic heart disease. He has had coronary artery bypass graft surgery twice, most recently in 1992 which was a redo. He has chronic angina that is managed medically. He has LV dysfunction hypertension diabetes mellitus and GERD. He was recently found to have prolonged pauses on Holter monitoring and subsequently underwent a pacemaker implantation successfully.  Since last visit the patient has had no prolonged chest pain.  He has been under chronic stress at home.  His wife who was an invalid with Alzheimer's died last week.  The patient himself remains under hospice care also.  He remains very weak and today is in a wheelchair.  At home he uses a walker. The patient recently was found to be hypothyroid and is now on Synthroid replacement.   Current Outpatient Prescriptions  Medication Sig Dispense Refill  . ALPRAZolam (XANAX) 0.25 MG tablet Take 0.25 mg by mouth 2 (two) times daily as needed. Anxiety      . amiodarone (PACERONE) 200 MG tablet Take 100 mg by mouth daily. Take 1/2 tablet (100 mg) daily      . aspirin EC 81 MG tablet Take 1 tablet (81 mg total) by mouth daily. Chest pain  90 tablet  3  . Cyanocobalamin (VITAMIN B 12 PO) Take 1 tablet by mouth daily.      Marland Kitchen docusate sodium (COLACE) 100 MG capsule Take 1 capsule (100 mg total) by mouth daily.  90 capsule  3  . fenofibrate (TRICOR) 48 MG tablet Take 1 tablet (48 mg total) by mouth daily.  90 tablet  3  . ferrous sulfate 325 (65 FE) MG tablet Take 1 tablet (325 mg total) by mouth every other day.  45 tablet  3  . furosemide (LASIX) 20 MG tablet Take 20 mg by mouth daily.       . insulin glargine (LANTUS) 100 UNIT/ML injection Inject 14 Units into the  skin at bedtime. 14-18 Units as directed, based on sugar levels.      . isosorbide mononitrate (IMDUR) 30 MG 24 hr tablet Take 1 tablet (30 mg total) by mouth 2 (two) times daily.  180 tablet  3  . levothyroxine (SYNTHROID) 75 MCG tablet Take 1 tablet (75 mcg total) by mouth daily.  90 tablet  3  . losartan (COZAAR) 100 MG tablet Take 50 mg by mouth daily. Take 1/2 tablet (50 mg) daily      . naproxen sodium (ANAPROX) 220 MG tablet Take 220 mg by mouth daily.      . nitroGLYCERIN (NITROSTAT) 0.4 MG SL tablet Place 1 tablet (0.4 mg total) under the tongue every 5 (five) minutes as needed. For chest pain.  100 tablet  3  . pantoprazole (PROTONIX) 40 MG tablet Take 1 tablet (40 mg total) by mouth daily.  90 tablet  3    Allergies  Allergen Reactions  . Penicillins Anaphylaxis and Swelling  . Lipitor (Atorvastatin Calcium) Other (See Comments)    Gi issues  . Macrodantin Other (See Comments)    Unknown   . Morphine And Related Other (See Comments)    "doesn't feel right"  . Ranolazine Er Other (See Comments)    constipation  . Statins Other (See Comments)  Leg weakness    Patient Active Problem List  Diagnosis  . Hypertension  . S/P CABG (coronary artery bypass graft)  . Diabetes mellitus type II  . Hypercholesterolemia  . Anxiety associated with depression  . Angina pectoris  . Syncope  . Subarachnoid hemorrhage  . CKD (chronic kidney disease), stage II  . CAD (coronary artery disease)  . Bundle branch block  . Wide-complex tachycardia  . Tachy-brady syndrome  . Acute myocardial infarction, subendocardial infarction, initial episode of care  . Pacemaker-Medtronic  . Hypotension  . Acute on chronic systolic CHF (congestive heart failure)  . Hypothyroid  . Constipation    History  Smoking status  . Never Smoker   Smokeless tobacco  . Never Used    History  Alcohol Use No    Family History  Problem Relation Age of Onset  . Heart attack Father     Review of  Systems: The patient denies any heat or cold intolerance.  No weight gain or weight loss.  The patient denies headaches or blurry vision.  There is no cough or sputum production.  The patient denies dizziness.  There is no hematuria or hematochezia.  The patient denies any muscle aches or arthritis.  The patient denies any rash.  The patient denies frequent falling or instability.  There is no history of depression or anxiety.  All other systems were reviewed and are negative.   Physical Exam: Filed Vitals:   11/13/12 1109  BP: 130/70  Pulse: 69  Resp: 18   the general appearance reveals a slightly pale elderly gentleman in no acute distress.  He is in a wheelchair.The head and neck exam reveals pupils equal and reactive.  Extraocular movements are full.  There is no scleral icterus.  The mouth and pharynx are normal.  The neck is supple.  The carotids reveal no bruits.  The jugular venous pressure is normal.  The  thyroid is not enlarged.  There is no lymphadenopathy.  The chest is clear to percussion and auscultation.  There are no rales or rhonchi.  Expansion of the chest is symmetrical.  The precordium is quiet.  The first heart sound is normal.  The second heart sound is physiologically split.  There is no murmur gallop rub or click.  There is no abnormal lift or heave.  The abdomen is soft and nontender.  The bowel sounds are normal.  The liver and spleen are not enlarged.  There are no abdominal masses.  There are no abdominal bruits.  Extremities reveal good pedal pulses.  There is no phlebitis or edema.  There is no cyanosis or clubbing.  Strength is normal and symmetrical in all extremities.  There is no lateralizing weakness.  There are no sensory deficits.  The skin is warm and dry.  There is no rash.     Assessment / Plan: The patient did not come in fasting today so we will postpone his labs until next visit.  Recheck in 3 months for followup office visit fasting lipid panel hepatic  function panel basal metabolic panel CBC, free T4, TSH.  Continue same Synthroid 75 mcg daily

## 2012-11-13 NOTE — Assessment & Plan Note (Signed)
Blood pressure has been stable on current therapy.  The patient has not had any presyncope or syncope.

## 2012-11-13 NOTE — Assessment & Plan Note (Signed)
The patient has not been aware of any racing or palpitations of his heart

## 2012-11-13 NOTE — Patient Instructions (Addendum)
No labs today  Your physician recommends that you continue on your current medications as directed. Please refer to the Current Medication list given to you today.  Your physician recommends that you schedule a follow-up appointment in: 3 months with fasting labs (LP/BMET/HFP/T4F/TSH/CBC)

## 2012-11-13 NOTE — Assessment & Plan Note (Signed)
The patient has not been having any crescendo angina.  He has occasional substernal tightness relieved by sublingual nitroglycerin

## 2012-11-21 IMAGING — CR DG CHEST 1V PORT
1 series · 1 of 1 positions shown · non-contrast
Comparison: Chest radiograph performed 04/05/2012

CLINICAL DATA: Chest pain.

PORTABLE CHEST - 1 VIEW

[view not recorded]
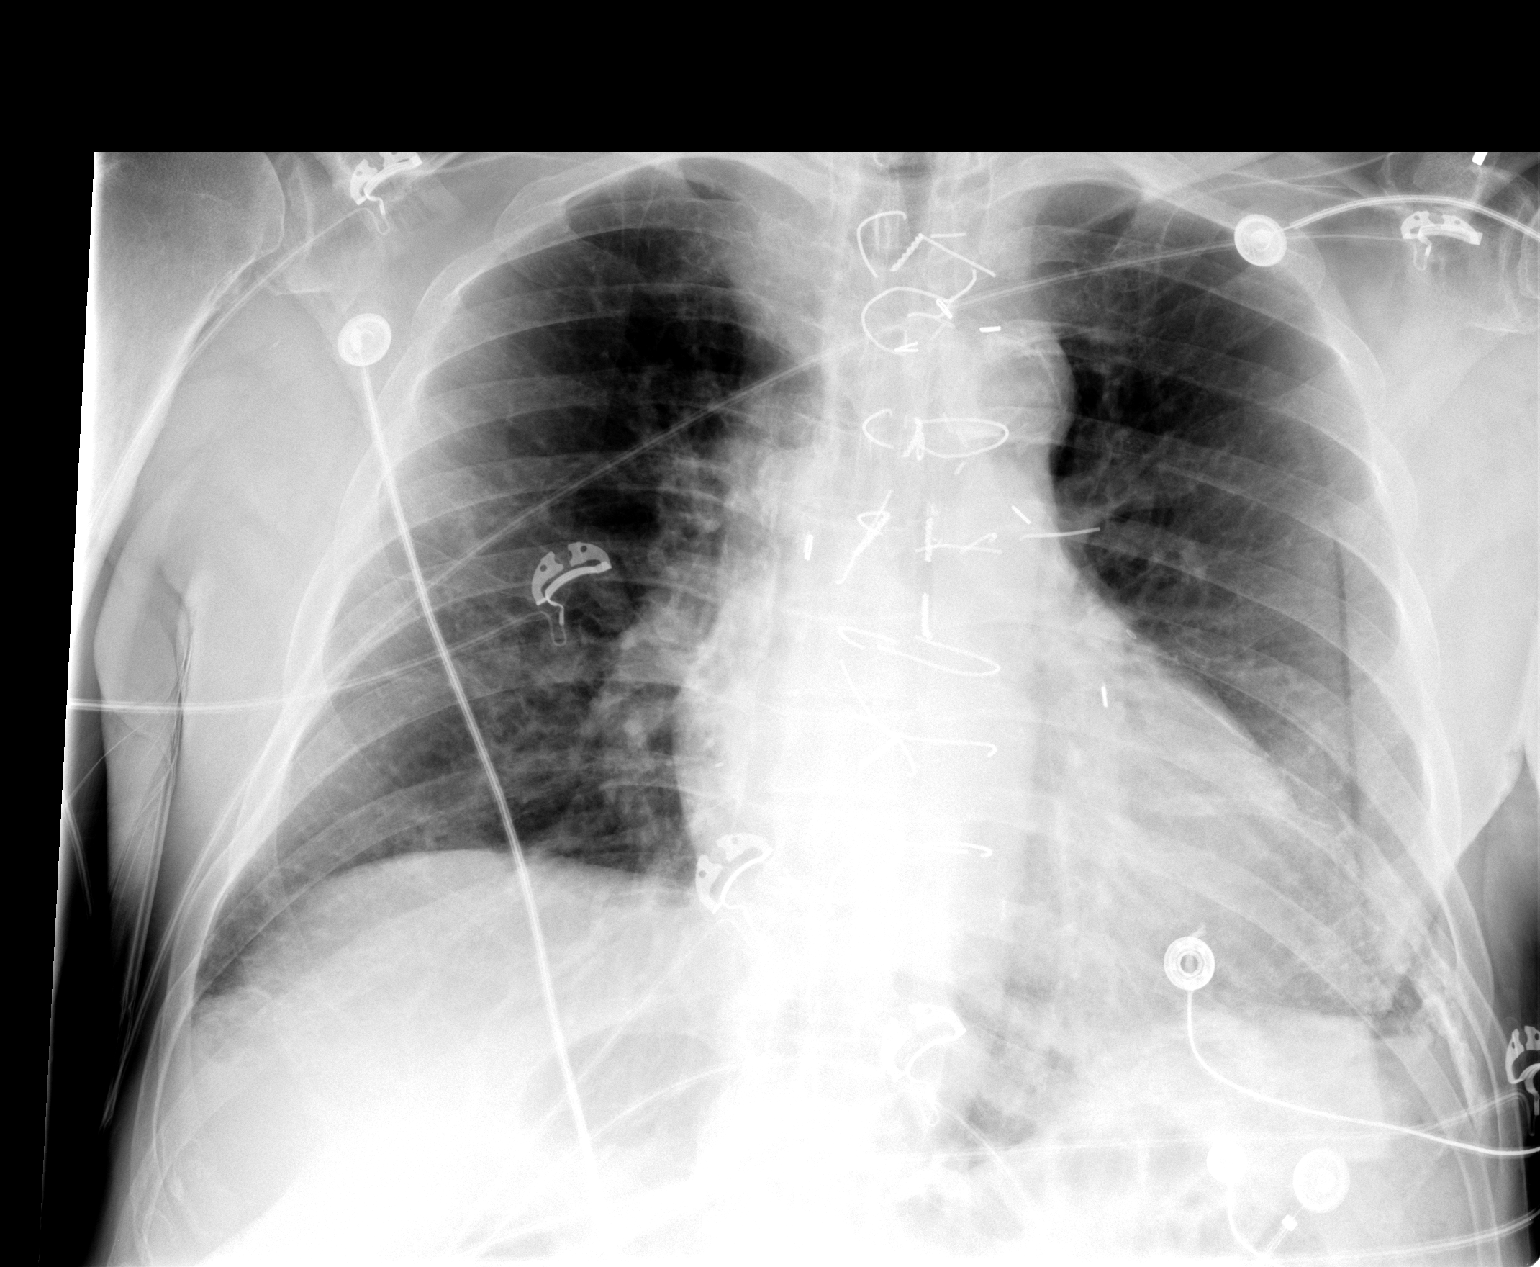

[1 of 1 positions shown; findings below may reference images not displayed]

FINDINGS: The lungs are well-aerated.  Mild bibasilar airspace
opacities likely reflect atelectasis or scarring.  There is no
evidence of pleural effusion or pneumothorax.

The cardiomediastinal silhouette is borderline normal in size; the
patient is status post median sternotomy.  There are fractures of
all of the visualized sternal wires.  No acute osseous
abnormalities are seen.
IMPRESSION: Mild bibasilar airspace opacities may reflect atelectasis or
scarring; no definite superimposed airspace consolidation seen.

## 2012-11-25 IMAGING — CT CT HEAD W/O CM
1 series · 16 of 30 positions shown, 20 images · non-contrast
Comparison: CT head without contrast 04/07/2012.

CLINICAL DATA: Follow-up subarachnoid hematoma.

CT HEAD WITHOUT CONTRAST
TECHNIQUE: Contiguous axial images were obtained from the base of
the skull through the vertex without contrast.

[Series 2: head routine 4.8 h37s · axial · 0.46mm/px · z∈[-121,+37]mm · 16 of 36 slices shown, 20 images]
[im 2/36  brain]
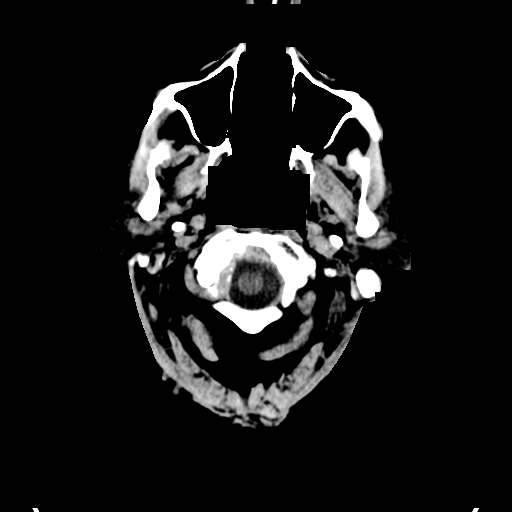
[im 2/36  bone]
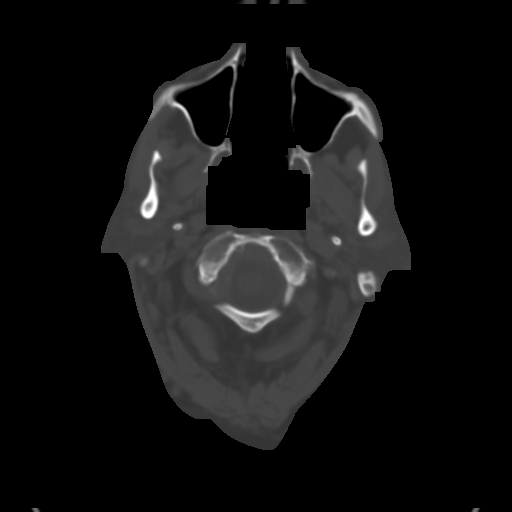
[im 4/36  brain]
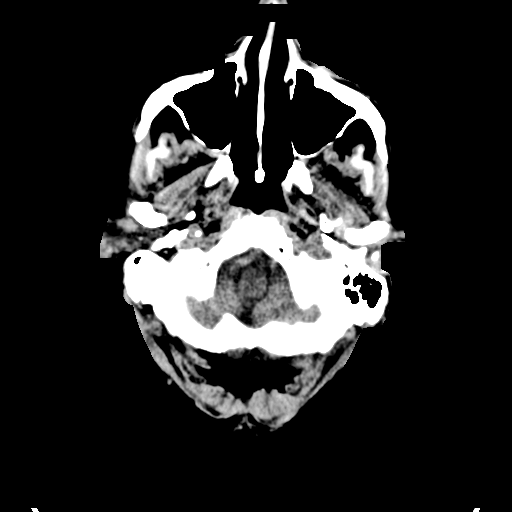
[im 7/36  brain]
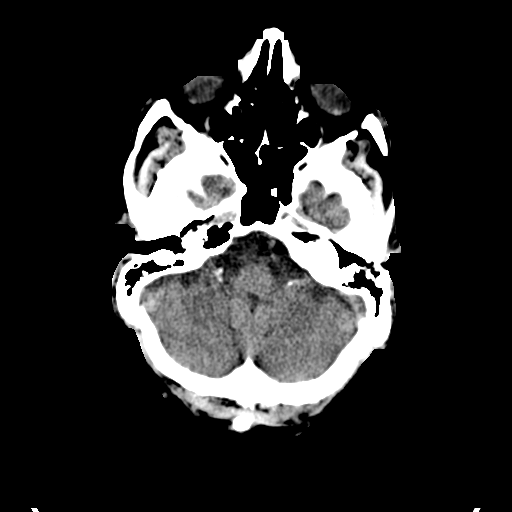
[im 9/36  brain]
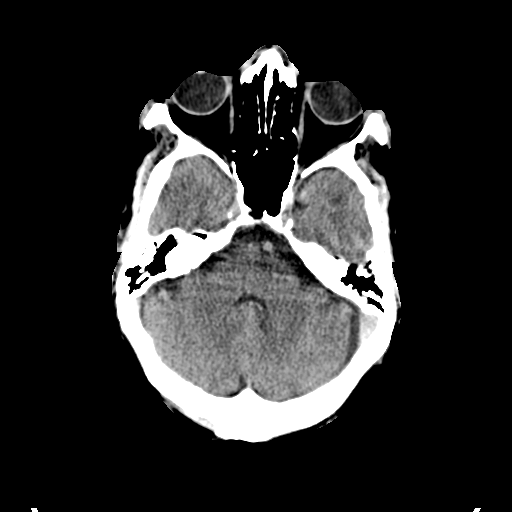
[im 10/36  brain]
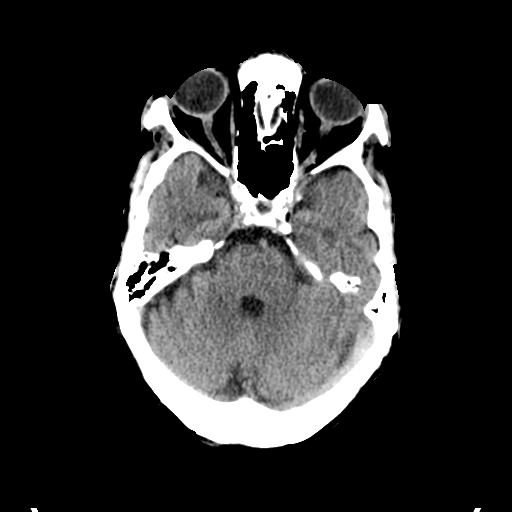
[im 10/36  bone]
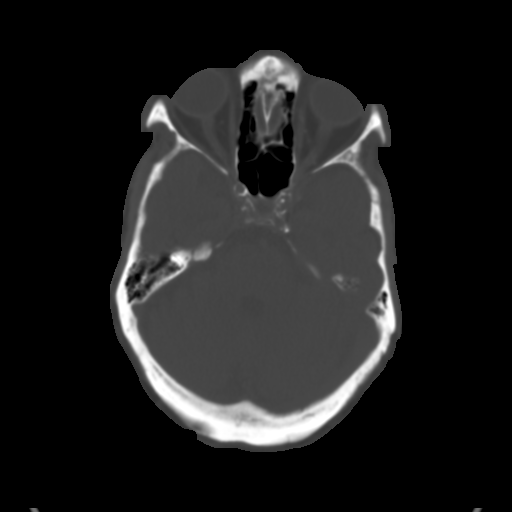
[im 13/36  brain]
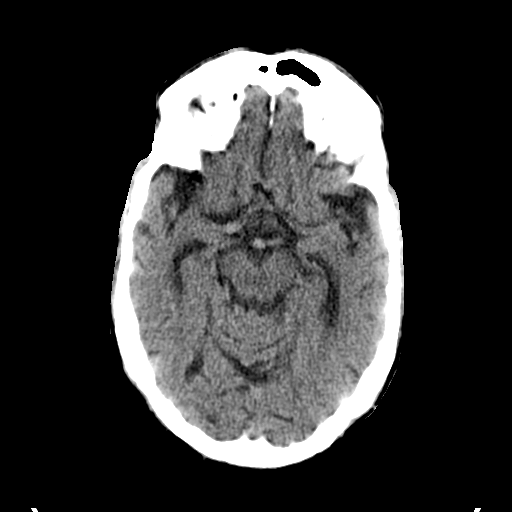
[im 15/36  brain]
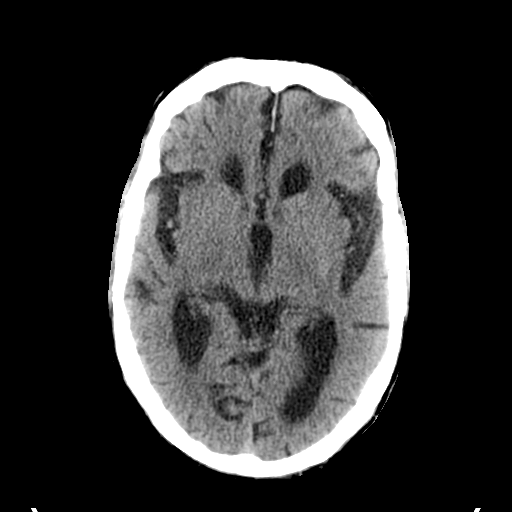
[im 17/36  brain]
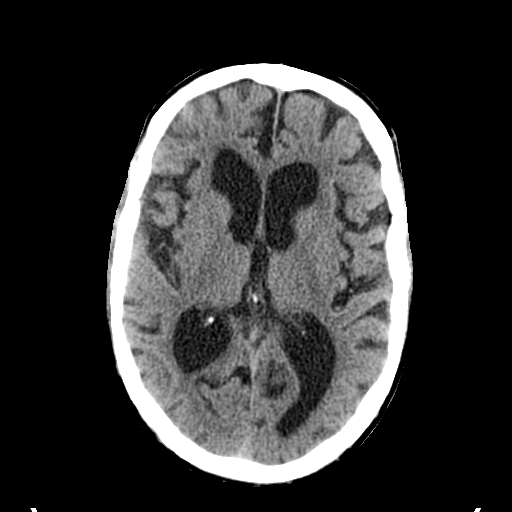
[im 19/36  brain]
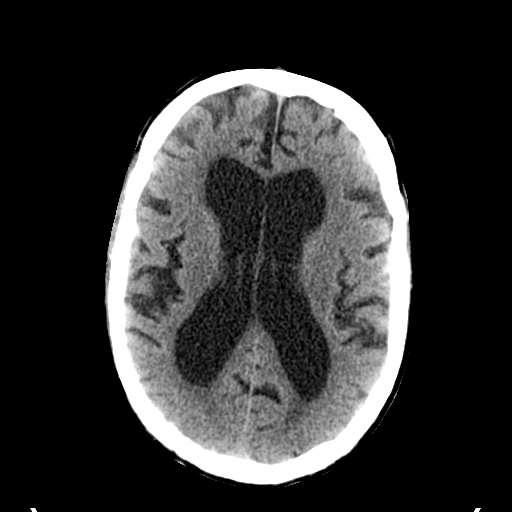
[im 19/36  bone]
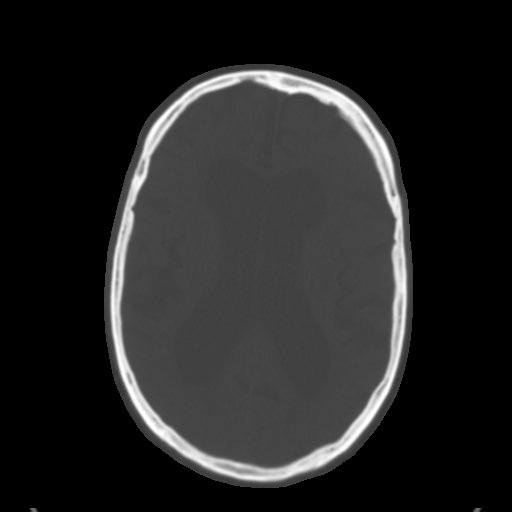
[im 21/36  brain]
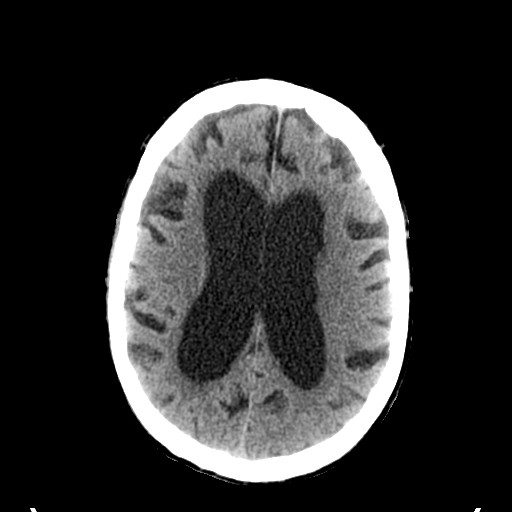
[im 23/36  brain]
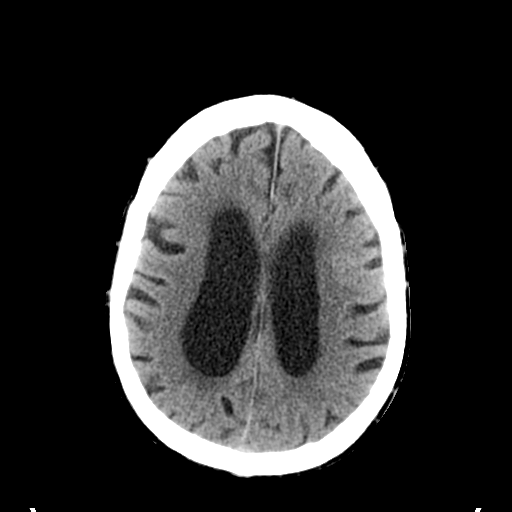
[im 26/36  brain]
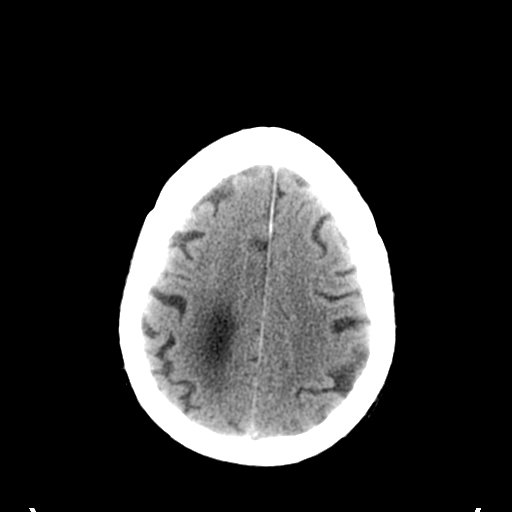
[im 27/36  brain]
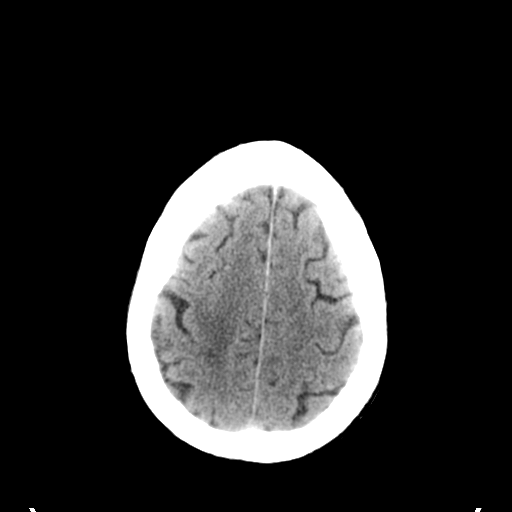
[im 27/36  bone]
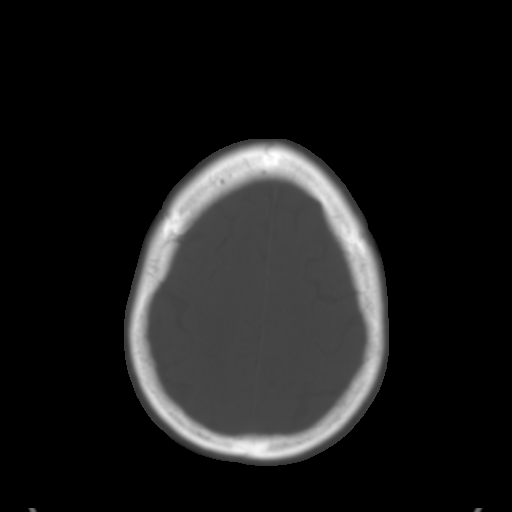
[im 29/36  brain]
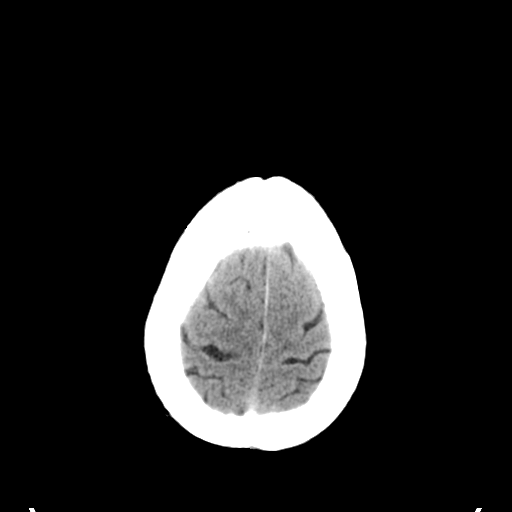
[im 32/36  brain]
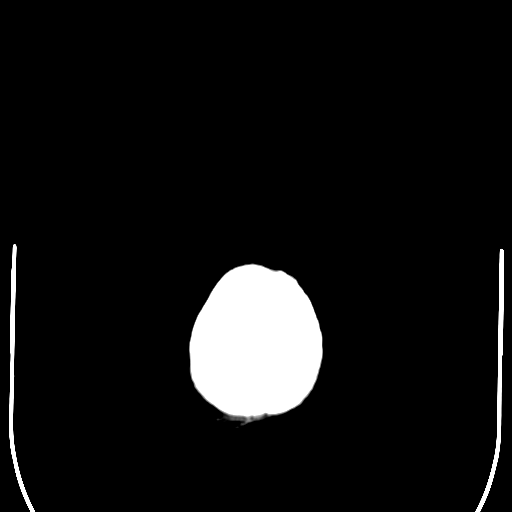
[im 34/36  brain]
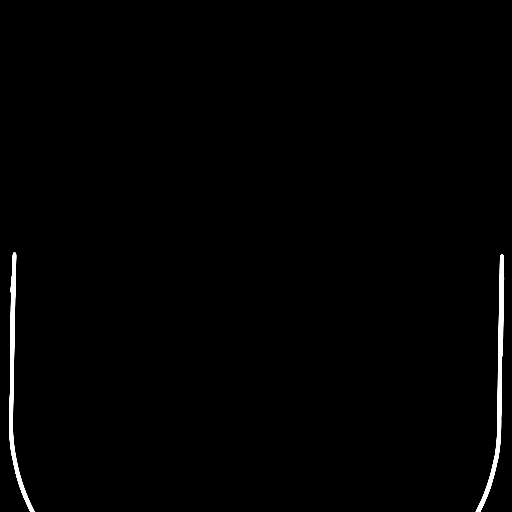

[16 of 30 positions shown; findings below may reference images not displayed]

FINDINGS: The previously seen left frontal subarachnoid blood is no
longer evident.

No acute cortical infarct, hemorrhage, mass lesion is present.
Moderate generalized atrophy is stable.  No significant extra-axial
fluid collection is present. The ventricles are proportionate to
the degree of atrophy.

The paranasal sinuses and mastoid air cells are clear.  The osseous
skull is intact.
IMPRESSION: 1.  Interval resolution of subarachnoid blood.
2.  No acute intracranial abnormality or new hemorrhage.
3.  Stable atrophy.

## 2013-01-19 ENCOUNTER — Telehealth: Payer: Self-pay | Admitting: Cardiology

## 2013-01-19 NOTE — Telephone Encounter (Signed)
Walk In pt Form " FMLA Dropped Off" sending to [facility]  01/19/13/KM

## 2013-01-29 ENCOUNTER — Encounter: Payer: Self-pay | Admitting: Internal Medicine

## 2013-02-08 ENCOUNTER — Telehealth: Payer: Self-pay | Admitting: Cardiology

## 2013-02-08 NOTE — Telephone Encounter (Signed)
Spoke with son and he wanted for  Dr. Patty Sermons to talk with hospice about extending patients care.  Discussed with  Dr. Patty Sermons and he had actually already spoken with hospice.  Per  Dr. Patty Sermons patient does not meet hospice criteria any longer. Advised son

## 2013-02-08 NOTE — Telephone Encounter (Signed)
New Problem    Calling on behalf of pt. Son would like to speak to nurse in regards to extending hospice care.

## 2013-02-09 ENCOUNTER — Telehealth: Payer: Self-pay | Admitting: Cardiology

## 2013-02-09 NOTE — Telephone Encounter (Signed)
Left message to call back  

## 2013-02-09 NOTE — Telephone Encounter (Signed)
New Problem     Hospice ending services today. Need prescription for hospital bed (needs to be extended for 30 days).  Fax Number: [phone]

## 2013-02-09 NOTE — Telephone Encounter (Signed)
New Prob   Calling to notify Dr. Patty Sermons that pt is no longer under hospice care.

## 2013-02-09 NOTE — Telephone Encounter (Signed)
Dr. Patty Sermons made aware

## 2013-02-09 NOTE — Telephone Encounter (Signed)
Explained to son again that patient has to be seen by  Dr. Patty Sermons and have documentation on why he needs hospital bed before order could be sent over.  Patient has appointment on Monday and this will be addressed at time.

## 2013-02-12 ENCOUNTER — Encounter: Payer: Self-pay | Admitting: Cardiology

## 2013-02-12 ENCOUNTER — Other Ambulatory Visit (INDEPENDENT_AMBULATORY_CARE_PROVIDER_SITE_OTHER): Payer: Medicare Other

## 2013-02-12 ENCOUNTER — Ambulatory Visit (INDEPENDENT_AMBULATORY_CARE_PROVIDER_SITE_OTHER): Payer: Medicare Other | Admitting: Cardiology

## 2013-02-12 VITALS — BP 138/74 | HR 64 | Ht 69.0 in | Wt 168.4 lb

## 2013-02-12 DIAGNOSIS — Z951 Presence of aortocoronary bypass graft: Secondary | ICD-10-CM

## 2013-02-12 DIAGNOSIS — I119 Hypertensive heart disease without heart failure: Secondary | ICD-10-CM

## 2013-02-12 DIAGNOSIS — I259 Chronic ischemic heart disease, unspecified: Secondary | ICD-10-CM

## 2013-02-12 DIAGNOSIS — E7800 Pure hypercholesterolemia, unspecified: Secondary | ICD-10-CM

## 2013-02-12 DIAGNOSIS — E039 Hypothyroidism, unspecified: Secondary | ICD-10-CM

## 2013-02-12 DIAGNOSIS — I609 Nontraumatic subarachnoid hemorrhage, unspecified: Secondary | ICD-10-CM

## 2013-02-12 DIAGNOSIS — Z950 Presence of cardiac pacemaker: Secondary | ICD-10-CM

## 2013-02-12 DIAGNOSIS — M1990 Unspecified osteoarthritis, unspecified site: Secondary | ICD-10-CM

## 2013-02-12 LAB — CBC WITH DIFFERENTIAL/PLATELET
Basophils Relative: 1 % (ref 0.0–3.0)
Eosinophils Relative: 5.4 % — ABNORMAL HIGH (ref 0.0–5.0)
HCT: 42 % (ref 39.0–52.0)
Hemoglobin: 14.2 g/dL (ref 13.0–17.0)
Lymphs Abs: 1.4 10*3/uL (ref 0.7–4.0)
MCV: 90.8 fl (ref 78.0–100.0)
Monocytes Absolute: 0.7 10*3/uL (ref 0.1–1.0)
RBC: 4.62 Mil/uL (ref 4.22–5.81)
WBC: 8.1 10*3/uL (ref 4.5–10.5)

## 2013-02-12 LAB — LIPID PANEL
Cholesterol: 171 mg/dL (ref 0–200)
Total CHOL/HDL Ratio: 5
Triglycerides: 81 mg/dL (ref 0.0–149.0)

## 2013-02-12 LAB — HEPATIC FUNCTION PANEL
AST: 14 U/L (ref 0–37)
Albumin: 3.7 g/dL (ref 3.5–5.2)
Alkaline Phosphatase: 62 U/L (ref 39–117)
Total Protein: 7.7 g/dL (ref 6.0–8.3)

## 2013-02-12 LAB — BASIC METABOLIC PANEL
BUN: 35 mg/dL — ABNORMAL HIGH (ref 6–23)
CO2: 28 mEq/L (ref 19–32)
Calcium: 9.1 mg/dL (ref 8.4–10.5)
Creatinine, Ser: 2 mg/dL — ABNORMAL HIGH (ref 0.4–1.5)

## 2013-02-12 IMAGING — CR DG CHEST 1V PORT
1 series · 1 of 1 positions shown · non-contrast
Comparison: 04/28/2012

CLINICAL DATA: Syncope, shortness of breath.

PORTABLE CHEST - 1 VIEW

[AP]
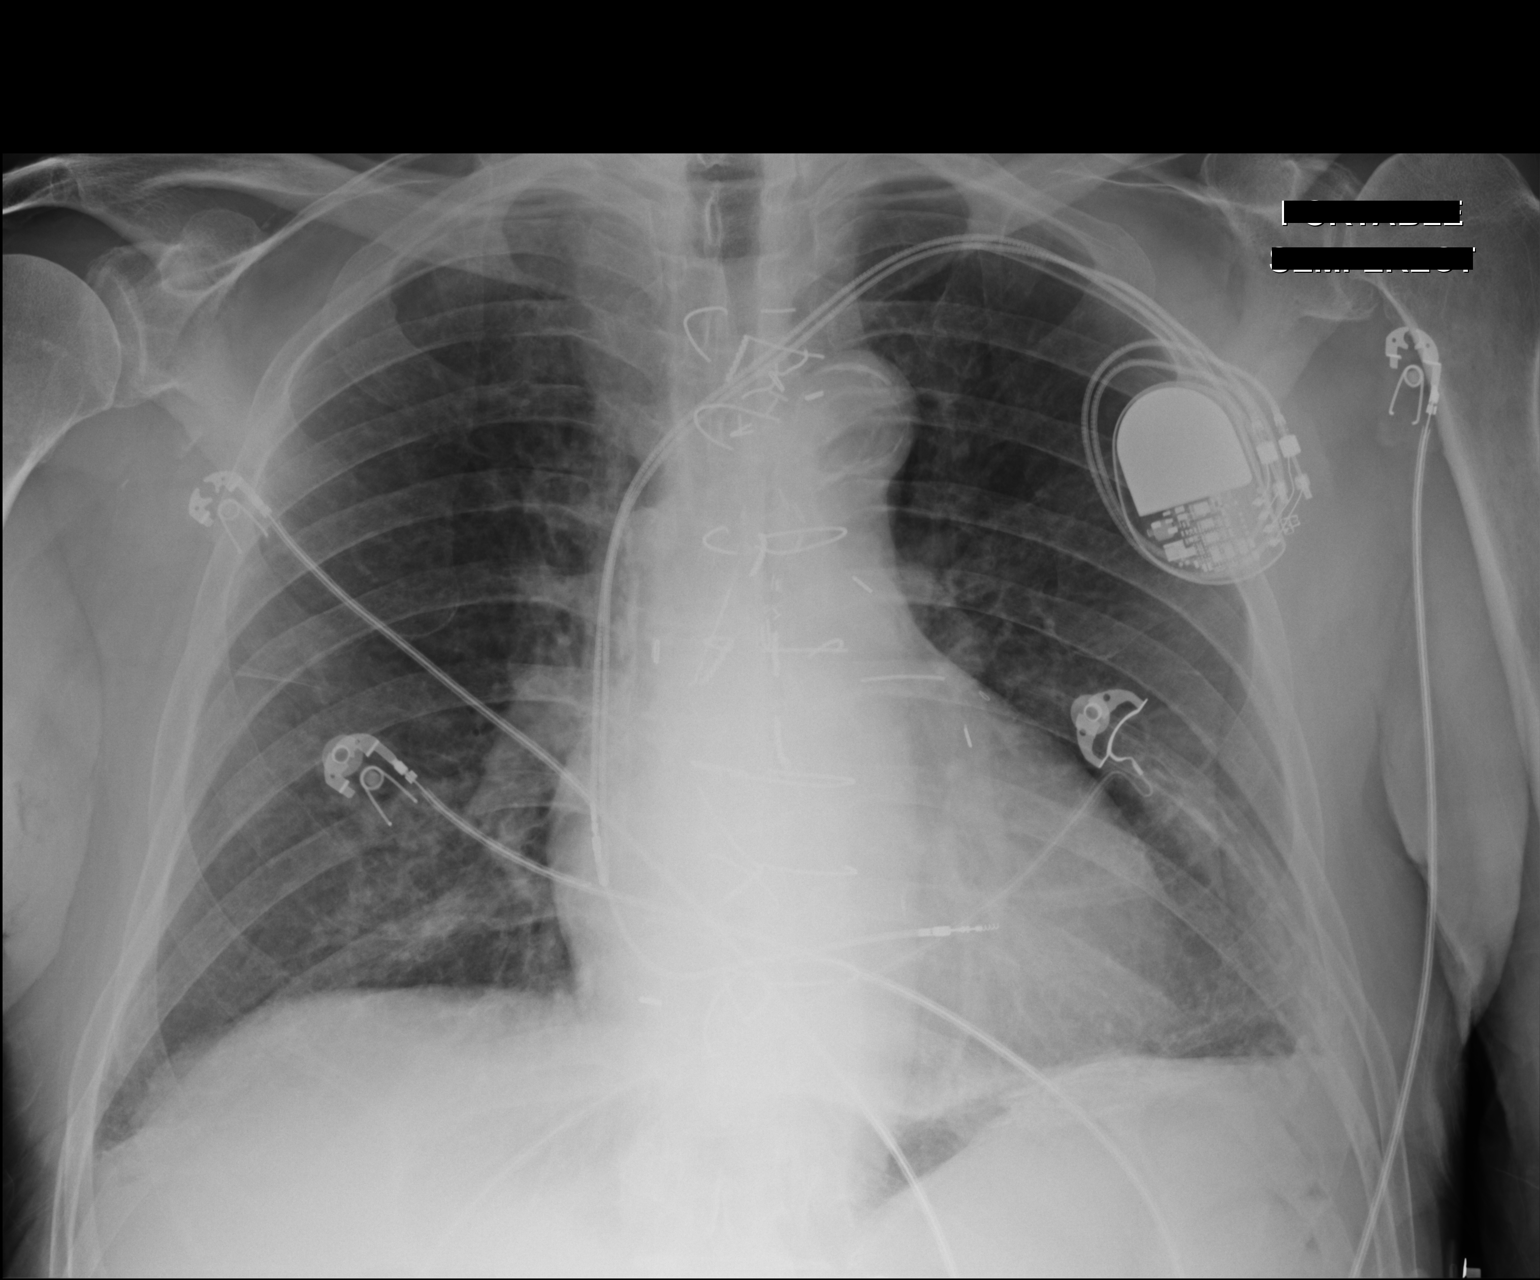

[1 of 1 positions shown; findings below may reference images not displayed]

FINDINGS: Cardiomegaly.  Aortic arch atherosclerosis.  Status post
median sternotomy.  Multiple fractured sternal wires again noted.
Left lung base scarring/pleural thickening, similar to priors.  No
new areas of consolidation.  No pneumothorax or definite pleural
effusion.  No acute osseous finding. Left chest wall battery pack
with lead tips unchanged in position on AP view.
IMPRESSION: Left lung base scarring is similar to prior.

Cardiomegaly without overt edema.

## 2013-02-12 NOTE — Assessment & Plan Note (Signed)
The patient has a Medtronic pacemaker for tachybradycardia syndrome.  He has not had any recent pacemaker followup checks and we will get him re-established with our pacemaker clinic.

## 2013-02-12 NOTE — Patient Instructions (Addendum)
Will send a note from today's visit to Dr Jamesetta Orleans at the [facility] as well as you so home health can be discussed and maybe provided by [facility] services  Your physician recommends that you continue on your current medications as directed. Please refer to the Current Medication list given to you today.  Your physician recommends that you schedule a follow-up appointment in: 3 months  Will have you return for a pacemaker check with the pace maker clinic

## 2013-02-12 NOTE — Assessment & Plan Note (Signed)
The patient has not been experiencing any recent chest pain or angina 

## 2013-02-12 NOTE — Assessment & Plan Note (Signed)
The patient is clinically euthyroid. 

## 2013-02-12 NOTE — Assessment & Plan Note (Signed)
The patient has a prior history of a subarachnoid hemorrhage.  He has significant difficulty with mobility.  He is unsteady on his feet.  He has difficulty in transferring from his wheelchair to his bed.  We are requesting a hospital bed for him.  The hospital bed will enable him to change position in bed and will also help him with orthopnea and his symptoms of congestive heart failure.  He previously had had a hospital bed through hospice but because of his relative stability his hospice benefits have been suspended and so we are now applying for a hospital bed through his regular medical coverage.  We are also going to request from his [place] doctor, Dr. Jules Husbands, at the [facility] provide him with a home health aide to help bathe him twice a week.  He is not steady enough to get in and out of the shower by himself.

## 2013-02-12 NOTE — Progress Notes (Signed)
Quick Note:  Please report to patient. The recent labs are stable. Continue same medication and careful diet.Thyroid is good. LDL higher--watch cholesterol in diet. ______

## 2013-02-12 NOTE — Progress Notes (Signed)
Benjamin Noble Date of Birth:  Mar 15, 1922 [facility] [address] [place], [place]  [address] [phone]         Fax   [phone]  History of Present Illness: This pleasant 79 year old gentleman is seen for a scheduled 3 month followup office visit. The patient has a history of ischemic heart disease. He has had coronary artery bypass graft surgery twice, most recently in 1992 which was a redo. He has chronic angina that is managed medically. He has LV dysfunction, hypertension, diabetes mellitus, and GERD. He was recently found to have prolonged pauses on Holter monitoring and subsequently underwent a pacemaker implantation successfully.  The patient has a history of hypothyroidism and is on thyroid replacement.  Patient has significant osteoarthritis.    Current Outpatient Prescriptions  Medication Sig Dispense Refill  . ALPRAZolam (XANAX) 0.25 MG tablet Take 0.25 mg by mouth 2 (two) times daily as needed. Anxiety      . amiodarone (PACERONE) 200 MG tablet Take 100 mg by mouth daily. Take 1/2 tablet (100 mg) daily      . aspirin EC 81 MG tablet Take 1 tablet (81 mg total) by mouth daily. Chest pain  90 tablet  3  . Cyanocobalamin (VITAMIN B 12 PO) Take 1 tablet by mouth daily.      Marland Kitchen docusate sodium (COLACE) 100 MG capsule Take 1 capsule (100 mg total) by mouth daily.  90 capsule  3  . fenofibrate (TRICOR) 48 MG tablet Take 1 tablet (48 mg total) by mouth daily.  90 tablet  3  . ferrous sulfate 325 (65 FE) MG tablet Take 1 tablet (325 mg total) by mouth every other day.  45 tablet  3  . furosemide (LASIX) 20 MG tablet Take 20 mg by mouth daily.       . insulin glargine (LANTUS) 100 UNIT/ML injection Inject 14 Units into the skin at bedtime. 14-18 Units as directed, based on sugar levels.      . isosorbide mononitrate (IMDUR) 30 MG 24 hr tablet Take 1 tablet (30 mg total) by mouth 2 (two) times daily.  180 tablet  3  . levothyroxine (SYNTHROID) 75 MCG  tablet Take 1 tablet (75 mcg total) by mouth daily.  90 tablet  3  . losartan (COZAAR) 100 MG tablet Take 50 mg by mouth daily. Take 1/2 tablet (50 mg) daily      . naproxen sodium (ANAPROX) 220 MG tablet Take 220 mg by mouth daily.      . nitroGLYCERIN (NITROSTAT) 0.4 MG SL tablet Place 1 tablet (0.4 mg total) under the tongue every 5 (five) minutes as needed. For chest pain.  100 tablet  3  . pantoprazole (PROTONIX) 40 MG tablet Take 1 tablet (40 mg total) by mouth daily.  90 tablet  3   No current facility-administered medications for this visit.    Allergies  Allergen Reactions  . Penicillins Anaphylaxis and Swelling  . Lipitor (Atorvastatin Calcium) Other (See Comments)    Gi issues  . Macrodantin Other (See Comments)    Unknown   . Morphine And Related Other (See Comments)    "doesn't feel right"  . Ranolazine Er Other (See Comments)    constipation  . Statins Other (See Comments)    Leg weakness    Patient Active Problem List  Diagnosis  . Hypertension  . S/P CABG (coronary artery bypass graft)  . Diabetes mellitus type II  . Hypercholesterolemia  . Anxiety associated with  depression  . Angina pectoris  . Syncope  . Subarachnoid hemorrhage  . CKD (chronic kidney disease), stage II  . CAD (coronary artery disease)  . Bundle branch block  . Wide-complex tachycardia  . Tachy-brady syndrome  . Acute myocardial infarction, subendocardial infarction, initial episode of care  . Pacemaker-Medtronic  . Hypotension  . Acute on chronic systolic CHF (congestive heart failure)  . Hypothyroid  . Constipation    History  Smoking status  . Never Smoker   Smokeless tobacco  . Never Used    History  Alcohol Use No    Family History  Problem Relation Age of Onset  . Heart attack Father     Review of Systems: Constitutional: no fever chills diaphoresis or fatigue or change in weight.  Head and neck: no hearing loss, no epistaxis, no photophobia or visual  disturbance. Respiratory: No cough, shortness of breath or wheezing. Cardiovascular: No chest pain peripheral edema, palpitations. Gastrointestinal: No abdominal distention, no abdominal pain, no change in bowel habits hematochezia or melena. Genitourinary: No dysuria, no frequency, no urgency, no nocturia. Musculoskeletal:No arthralgias, no back pain, no gait disturbance or myalgias. Neurological: No dizziness, no headaches, no numbness, no seizures, no syncope, no weakness, no tremors. Hematologic: No lymphadenopathy, no easy bruising. Psychiatric: No confusion, no hallucinations, no sleep disturbance.    Physical Exam: Filed Vitals:   02/12/13 1134  BP: 138/74  Pulse: 64   the general appearance reveals a somewhat pale elderly gentleman in no distress.  He travels by wheelchair.The head and neck exam reveals pupils equal and reactive.  Extraocular movements are full.  There is no scleral icterus.  The mouth and pharynx are normal.  The neck is supple.  The carotids reveal no bruits.  The jugular venous pressure is normal.  The  thyroid is not enlarged.  There is no lymphadenopathy.  The chest is clear to percussion and auscultation.  There are no rales or rhonchi.  Expansion of the chest is symmetrical.  The precordium is quiet.  The first heart sound is normal.  The second heart sound is physiologically split.  There is no murmur gallop rub or click.  There is no abnormal lift or heave.  The abdomen is soft and nontender.  The bowel sounds are normal.  The liver and spleen are not enlarged.  There are no abdominal masses.  There are no abdominal bruits.  Extremities reveal good pedal pulses.  There is no phlebitis or edema.  There is no cyanosis or clubbing.  Strength is decreased in his lower extremities. There is no lateralizing weakness.  There are no sensory deficits.  The skin is warm and dry.  There is no rash.  Patient appears to be somewhat pale and chronically ill.     Assessment /  Plan: [facility] to help him with bathing twice a week. The patient will now be staying by himself for long periods during the day but he has a medic alert bracelet to call for help if he needs it.  The family will also provide a housekeeper to come in for several hours a day and help prepare meals etc. Continue same medication and be rechecked in 3 months for followup office visit. Arrange for pacemaker follow up in the pacer clinic.

## 2013-02-16 ENCOUNTER — Telehealth: Payer: Self-pay | Admitting: Cardiology

## 2013-02-16 NOTE — Telephone Encounter (Signed)
New Prob    Dr. Patty Sermons wrote new prescription for a hospital bed. Were going to go through the [place] to get the bed, but will take too long, he wants to know if they can go through medicare instead for the bed.

## 2013-02-16 NOTE — Telephone Encounter (Signed)
Will forward to [facility] to follow up on next week.

## 2013-02-26 ENCOUNTER — Ambulatory Visit (INDEPENDENT_AMBULATORY_CARE_PROVIDER_SITE_OTHER): Payer: Medicare Other | Admitting: *Deleted

## 2013-02-26 ENCOUNTER — Other Ambulatory Visit: Payer: Self-pay | Admitting: Internal Medicine

## 2013-02-26 DIAGNOSIS — I509 Heart failure, unspecified: Secondary | ICD-10-CM

## 2013-02-26 DIAGNOSIS — I5023 Acute on chronic systolic (congestive) heart failure: Secondary | ICD-10-CM

## 2013-02-26 DIAGNOSIS — I495 Sick sinus syndrome: Secondary | ICD-10-CM

## 2013-02-26 LAB — PACEMAKER DEVICE OBSERVATION
AL IMPEDENCE PM: 461 Ohm
AL THRESHOLD: 0.5 V
ATRIAL PACING PM: 75
BATTERY VOLTAGE: 2.79 V
VENTRICULAR PACING PM: 57

## 2013-02-26 NOTE — Progress Notes (Signed)
PPM check 

## 2013-02-27 NOTE — Telephone Encounter (Signed)
Sent to [facility] as requested

## 2013-02-28 ENCOUNTER — Telehealth: Payer: Self-pay | Admitting: Cardiology

## 2013-02-28 NOTE — Telephone Encounter (Signed)
An order was sent to them for the pt to get a hospital bed and they need provider NPI number and date on Rx and refaxed

## 2013-02-28 NOTE — Telephone Encounter (Signed)
Spoke to Daria from [facility] she stated she needs a new script for patient to get a hospital bed.States she will need a date and Dr.Brackbill's NPI #.Message sent to Dr.Brackbill's nurse.

## 2013-03-01 NOTE — Telephone Encounter (Signed)
Will fax again

## 2013-05-15 ENCOUNTER — Encounter: Payer: Self-pay | Admitting: Internal Medicine

## 2013-05-15 ENCOUNTER — Ambulatory Visit (INDEPENDENT_AMBULATORY_CARE_PROVIDER_SITE_OTHER): Payer: Medicare Other | Admitting: Internal Medicine

## 2013-05-15 VITALS — BP 123/63 | HR 67 | Ht 69.0 in | Wt 175.8 lb

## 2013-05-15 DIAGNOSIS — I495 Sick sinus syndrome: Secondary | ICD-10-CM

## 2013-05-15 DIAGNOSIS — Z951 Presence of aortocoronary bypass graft: Secondary | ICD-10-CM

## 2013-05-15 DIAGNOSIS — Z950 Presence of cardiac pacemaker: Secondary | ICD-10-CM

## 2013-05-15 DIAGNOSIS — I10 Essential (primary) hypertension: Secondary | ICD-10-CM

## 2013-05-15 LAB — PACEMAKER DEVICE OBSERVATION
AL THRESHOLD: 0.5 V
ATRIAL PACING PM: 78
RV LEAD IMPEDENCE PM: 538 Ohm
RV LEAD THRESHOLD: 0.75 V

## 2013-05-15 NOTE — Assessment & Plan Note (Signed)
He is over 20 years out from redo bypass surgery. He denies anginal symptoms. He is very sedentary. No change in medical therapy.

## 2013-05-15 NOTE — Assessment & Plan Note (Signed)
His blood pressure is well controlled today. No change in medical therapy. 

## 2013-05-15 NOTE — Progress Notes (Signed)
HPI [name] returns today for followup. He is a very pleasant 79 year old man with a history of symptomatic bradycardia, status post permanent pacemaker insertion. He also has an ischemic cardiomyopathy and chronic class II systolic heart failure. Previously, his ejection fraction is been 35-40%. The patient is status post redo bypass over 20 years ago. Allergies  Allergen Reactions  . Penicillins Anaphylaxis and Swelling  . Lipitor (Atorvastatin Calcium) Other (See Comments)    Gi issues  . Macrodantin Other (See Comments)    Unknown   . Morphine And Related Other (See Comments)    "doesn't feel right"  . Ranolazine Er Other (See Comments)    constipation  . Statins Other (See Comments)    Leg weakness     Current Outpatient Prescriptions  Medication Sig Dispense Refill  . ALPRAZolam (XANAX) 0.25 MG tablet Take 0.25 mg by mouth 2 (two) times daily as needed. Anxiety      . amiodarone (PACERONE) 200 MG tablet Take 100 mg by mouth daily. Take 1/2 tablet (100 mg) daily      . aspirin EC 81 MG tablet Take 1 tablet (81 mg total) by mouth daily. Chest pain  90 tablet  3  . Cyanocobalamin (VITAMIN B 12 PO) Take 1 tablet by mouth every other day.       . docusate sodium (COLACE) 100 MG capsule Take 1 capsule (100 mg total) by mouth daily.  90 capsule  3  . fenofibrate (TRICOR) 48 MG tablet Take 1 tablet (48 mg total) by mouth daily.  90 tablet  3  . ferrous sulfate 325 (65 FE) MG tablet Take 1 tablet (325 mg total) by mouth every other day.  45 tablet  3  . furosemide (LASIX) 20 MG tablet Take 20 mg by mouth daily.       . insulin glargine (LANTUS) 100 UNIT/ML injection Inject 18 Units into the skin at bedtime. 14-18 Units as directed, based on sugar levels.      . isosorbide mononitrate (IMDUR) 30 MG 24 hr tablet Take 1 tablet (30 mg total) by mouth 2 (two) times daily.  180 tablet  3  . levothyroxine (SYNTHROID) 75 MCG tablet Take 1 tablet (75 mcg total) by mouth daily.  90 tablet  3   . losartan (COZAAR) 100 MG tablet Take 50 mg by mouth daily. Take 1/2 tablet (50 mg) daily      . naproxen sodium (ANAPROX) 220 MG tablet Take 220 mg by mouth daily.      . nitroGLYCERIN (NITROSTAT) 0.4 MG SL tablet Place 1 tablet (0.4 mg total) under the tongue every 5 (five) minutes as needed. For chest pain.  100 tablet  3  . pantoprazole (PROTONIX) 40 MG tablet Take 1 tablet (40 mg total) by mouth daily.  90 tablet  3   No current facility-administered medications for this visit.     Past Medical History  Diagnosis Date  . Ischemic heart disease     original CABG in 1984 with redo CABG in 1992; managed medically  . HTN (hypertension)   . Angina pectoris   . Hypercholesterolemia   . CHF (congestive heart failure)     EF is 35 to 40% per echo April 2013  . Arthritis   . Urinary incontinence   . Myocardial infarction   . Dysrhythmia     irregular  heart beats per patient  . Asthma   . Shortness of breath   . Diabetes mellitus  type 2  . GERD (gastroesophageal reflux disease)   . Neuromuscular disorder     periferal neruropathy  . Depression   . Syncope   . Subarachnoid hemorrhage April 2013    from fall/coumadin & aspirin stopped  . Coronary artery disease   . Pacemaker   . Chronic kidney disease     hx of acute renal failure    ROS:   All systems reviewed and negative except as noted in the HPI.   Past Surgical History  Procedure Laterality Date  . Cardiac catheterization  03/18/1998  . Coronary artery bypass graft  1610,9604    first at [facility]  . Circumcision  02/05/08  . Eye surgery    . Cardiac stents    . Insert / replace / remove pacemaker  05/2012     Family History  Problem Relation Age of Onset  . Heart attack Father      History   Social History  . Marital Status: Married    Spouse Name: N/A    Number of Children: N/A  . Years of Education: N/A   Occupational History  . Not on file.   Social History Main Topics  . Smoking  status: Never Smoker   . Smokeless tobacco: Never Used  . Alcohol Use: No  . Drug Use: No  . Sexually Active: Not Currently   Other Topics Concern  . Not on file   Social History Narrative   The patient lives at home with his wife, who has alzheimer's.  They have a 24-hr sitter in the home, and close family contacts nearby.  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     BP 123/63  Pulse 67  Ht 5\' 9"  (1.753 m)  Wt 175 lb 12.8 oz (79.742 kg)  BMI 25.95 kg/m2  Physical Exam:  Chronically ill appearing 79 year old man, NAD HEENT: Unremarkable Neck:  No JVD, no thyromegally Lungs:  Clear with no wheezes, rales, or rhonchi. HEART:  Regular rate rhythm, no murmurs, no rubs, no clicks Abd:  soft, positive bowel sounds, no organomegally, no rebound, no guarding Ext:  2 plus pulses, no edema, no cyanosis, no clubbing Skin:  No rashes no nodules Neuro:  CN II through XII intact, motor grossly intact  DEVICE  Normal device function.  See PaceArt for details.   Assess/Plan:

## 2013-05-15 NOTE — Patient Instructions (Signed)
Your physician wants you to follow-up in: 12 months with Dr Taylor You will receive a reminder letter in the mail two months in advance. If you don't receive a letter, please call our office to schedule the follow-up appointment.    Remote monitoring is used to monitor your Pacemaker of ICD from home. This monitoring reduces the number of office visits required to check your device to one time per year. It allows us to keep an eye on the functioning of your device to ensure it is working properly. You are scheduled for a device check from home on 08/21/13. You may send your transmission at any time that day. If you have a wireless device, the transmission will be sent automatically. After your physician reviews your transmission, you will receive a postcard with your next transmission date.   

## 2013-05-15 NOTE — Assessment & Plan Note (Signed)
His Medtronic dual-chamber pacemaker is working normally. We'll plan to recheck in several months. 

## 2013-05-28 ENCOUNTER — Encounter: Payer: Self-pay | Admitting: Cardiology

## 2013-05-28 ENCOUNTER — Ambulatory Visit (INDEPENDENT_AMBULATORY_CARE_PROVIDER_SITE_OTHER): Payer: Medicare Other | Admitting: Cardiology

## 2013-05-28 VITALS — BP 126/62 | HR 64 | Ht 69.0 in | Wt 175.0 lb

## 2013-05-28 DIAGNOSIS — I259 Chronic ischemic heart disease, unspecified: Secondary | ICD-10-CM

## 2013-05-28 DIAGNOSIS — I5023 Acute on chronic systolic (congestive) heart failure: Secondary | ICD-10-CM

## 2013-05-28 DIAGNOSIS — I509 Heart failure, unspecified: Secondary | ICD-10-CM

## 2013-05-28 DIAGNOSIS — I2510 Atherosclerotic heart disease of native coronary artery without angina pectoris: Secondary | ICD-10-CM

## 2013-05-28 DIAGNOSIS — E039 Hypothyroidism, unspecified: Secondary | ICD-10-CM

## 2013-05-28 MED ORDER — ALPRAZOLAM 0.25 MG PO TABS: 0.2500 mg | ORAL_TABLET | Freq: Two times a day (BID) | ORAL | Status: AC | PRN

## 2013-05-28 NOTE — Assessment & Plan Note (Signed)
Patient has a history of chronic systolic congestive heart failure.  He is not having any worsening of his dyspnea at rest.  He is very sedentary.  He comes by wheelchair today.

## 2013-05-28 NOTE — Progress Notes (Signed)
Benjamin Noble Date of Birth:  12-01-1922 [facility] [address] [place], [place]  [address] [phone]         Fax   [phone]  History of Present Illness: This pleasant 79 year old gentleman is seen for a scheduled 3 month followup office visit. The patient has a history of ischemic heart disease. He has had coronary artery bypass graft surgery twice, most recently in 1992 which was a redo. He has chronic angina that is managed medically. He has LV dysfunction, hypertension, diabetes mellitus, and GERD. He was recently found to have prolonged pauses on Holter monitoring and subsequently underwent a pacemaker implantation successfully. The patient has a history of hypothyroidism and is on thyroid replacement. Patient has significant osteoarthritis.  He is a recent widower.   Current Outpatient Prescriptions  Medication Sig Dispense Refill  . ALPRAZolam (XANAX) 0.25 MG tablet Take 1 tablet (0.25 mg total) by mouth 2 (two) times daily as needed.  180 tablet  1  . amiodarone (PACERONE) 200 MG tablet Take 100 mg by mouth daily. Take 1/2 tablet (100 mg) daily      . aspirin EC 81 MG tablet Take 1 tablet (81 mg total) by mouth daily. Chest pain  90 tablet  3  . Cyanocobalamin (VITAMIN B 12 PO) Take 1 tablet by mouth every other day.       . docusate sodium (COLACE) 100 MG capsule Take 1 capsule (100 mg total) by mouth daily.  90 capsule  3  . fenofibrate (TRICOR) 48 MG tablet Take 1 tablet (48 mg total) by mouth daily.  90 tablet  3  . ferrous sulfate 325 (65 FE) MG tablet Take 1 tablet (325 mg total) by mouth every other day.  45 tablet  3  . furosemide (LASIX) 20 MG tablet Take 20 mg by mouth daily.       . insulin glargine (LANTUS) 100 UNIT/ML injection Inject 18 Units into the skin at bedtime. 14-18 Units as directed, based on sugar levels.      . isosorbide mononitrate (IMDUR) 30 MG 24 hr tablet Take 1 tablet (30 mg total) by mouth 2 (two) times daily.   180 tablet  3  . levothyroxine (SYNTHROID) 75 MCG tablet Take 1 tablet (75 mcg total) by mouth daily.  90 tablet  3  . losartan (COZAAR) 100 MG tablet Take 50 mg by mouth daily. Take 1/2 tablet (50 mg) daily      . naproxen sodium (ANAPROX) 220 MG tablet Take 220 mg by mouth daily.      . nitroGLYCERIN (NITROSTAT) 0.4 MG SL tablet Place 1 tablet (0.4 mg total) under the tongue every 5 (five) minutes as needed. For chest pain.  100 tablet  3  . pantoprazole (PROTONIX) 40 MG tablet Take 1 tablet (40 mg total) by mouth daily.  90 tablet  3   No current facility-administered medications for this visit.    Allergies  Allergen Reactions  . Penicillins Anaphylaxis and Swelling  . Lipitor (Atorvastatin Calcium) Other (See Comments)    Gi issues  . Macrodantin Other (See Comments)    Unknown   . Morphine And Related Other (See Comments)    "doesn't feel right"  . Ranolazine Er Other (See Comments)    constipation  . Statins Other (See Comments)    Leg weakness    Patient Active Problem List   Diagnosis Date Noted  . Angina pectoris 03/15/2011    Priority: High  . Hypertension  Priority: High  . Diabetes mellitus type II 03/15/2011    Priority: Medium  . Hypercholesterolemia 03/15/2011    Priority: Medium  . Acute on chronic systolic CHF (congestive heart failure) 07/19/2012  . Hypothyroid 07/19/2012  . Constipation 07/19/2012  . Hypotension 07/18/2012  . Pacemaker-Medtronic 04/28/2012  . Acute myocardial infarction, subendocardial infarction, initial episode of care 04/27/2012  . Wide-complex tachycardia 04/26/2012  . Tachy-brady syndrome 04/26/2012  . Syncope 04/05/2012  . Subarachnoid hemorrhage 04/05/2012  . CKD (chronic kidney disease), stage II 04/05/2012  . CAD (coronary artery disease) 04/05/2012  . Bundle branch block 04/05/2012  . S/P CABG (coronary artery bypass graft) 03/15/2011  . Anxiety associated with depression 03/15/2011    History  Smoking status  .  Never Smoker   Smokeless tobacco  . Never Used    History  Alcohol Use No    Family History  Problem Relation Age of Onset  . Heart attack Father     Review of Systems: Constitutional: no fever chills diaphoresis or fatigue or change in weight.  Head and neck: no hearing loss, no epistaxis, no photophobia or visual disturbance. Respiratory: No cough, shortness of breath or wheezing. Cardiovascular: No chest pain peripheral edema, palpitations. Gastrointestinal: No abdominal distention, no abdominal pain, no change in bowel habits hematochezia or melena. Genitourinary: No dysuria, no frequency, no urgency, no nocturia. Musculoskeletal:No arthralgias, no back pain, no gait disturbance or myalgias. Neurological: No dizziness, no headaches, no numbness, no seizures, no syncope, no weakness, no tremors. Hematologic: No lymphadenopathy, no easy bruising. Psychiatric: No confusion, no hallucinations, no sleep disturbance.    Physical Exam: Filed Vitals:   05/28/13 1105  BP: 126/62  Pulse: 64   the general appearance reveals a elderly gentleman in a wheelchair.  He is in no acute distress.  He is slightly pale..The head and neck exam reveals pupils equal and reactive.  Extraocular movements are full.  There is no scleral icterus.  The mouth and pharynx are normal.  The neck is supple.  The carotids reveal no bruits.  The jugular venous pressure is normal.  The  thyroid is not enlarged.  There is no lymphadenopathy.  The chest is clear to percussion and auscultation.  There are no rales or rhonchi.  Expansion of the chest is symmetrical.  The precordium is quiet.  The first heart sound is normal.  The second heart sound is physiologically split.  There is no murmur gallop rub or click.  There is no abnormal lift or heave.  The abdomen is soft and nontender.  The bowel sounds are normal.  The liver and spleen are not enlarged.  There are no abdominal masses.  There are no abdominal bruits.   Extremities reveal good pedal pulses.  There is no phlebitis or edema.  There is no cyanosis or clubbing.  Strength is decreased slightly on the right side.  There is no lateralizing weakness.  There are no sensory deficits.  The skin is warm and dry.  There is no rash.  He complains that his right hand feels cold but palpation of the hand reveals normal skin temperature and he has excellent radial pulse bilaterally.     Assessment / Plan:  Continue same medication.  We refilled his Xanax which helps him with his nocturnal leg spasms.  He also has osteoarthritic discomfort of the right hip for which he takes Aleve.  He has had a prior stroke which has left him with some weakness on the right side  of his body.  recheck in 3 months for followup office visit

## 2013-05-28 NOTE — Patient Instructions (Addendum)
Your physician recommends that you continue on your current medications as directed. Please refer to the Current Medication list given to you today.  Your physician recommends that you schedule a follow-up appointment in: 3 month ov 

## 2013-05-28 NOTE — Assessment & Plan Note (Signed)
The patient is presently on 18 units of Lantus insulin nightly.  He is not having a severe hypoglycemic episodes.  His appetite is fair and he does also drink Glucerna.  His weight is unchanged at 175 pounds.  He will continue current therapy

## 2013-05-28 NOTE — Assessment & Plan Note (Signed)
The patient continues to have frequent angina pectoris.  His angina is brought on by exertion and relieved by rest and by sublingual nitroglycerin.

## 2013-05-28 NOTE — Assessment & Plan Note (Signed)
Patient is clinically euthyroid on 75 mcg Synthroid daily

## 2013-07-24 ENCOUNTER — Telehealth: Payer: Self-pay | Admitting: Cardiology

## 2013-07-24 NOTE — Telephone Encounter (Signed)
LMOVM Dr. Patty Sermons aware & can write prescription for heating pad if needed Mylo Red RN

## 2013-07-24 NOTE — Telephone Encounter (Signed)
New Prob  Pt's son called and claims [facility] will be able to fill a prescription for a heating pad. Pt needs a new prescription for the heating pad

## 2013-07-24 NOTE — Telephone Encounter (Signed)
Follow Up     Pt calling returning your call. Please call.

## 2013-07-24 NOTE — Telephone Encounter (Signed)
lmtcb Debbie Amiri Tritch RN  

## 2013-07-25 NOTE — Telephone Encounter (Signed)
Returned call to son Dr.Brackbill will write prescription for heating pad.Son stated he will call back with phone # to [facility] and we can call order in.

## 2013-07-27 NOTE — Telephone Encounter (Signed)
Patient's son, Amada Jupiter called back and I informed him that paperwork has been completed and faxed to [place].  Amada Jupiter verbalized understanding and thanked me.

## 2013-07-31 ENCOUNTER — Other Ambulatory Visit: Payer: Self-pay

## 2013-07-31 MED ORDER — LOSARTAN POTASSIUM 100 MG PO TABS: 50.0000 mg | ORAL_TABLET | Freq: Every day | ORAL | Status: DC

## 2013-08-02 ENCOUNTER — Other Ambulatory Visit: Payer: Self-pay | Admitting: *Deleted

## 2013-08-03 ENCOUNTER — Telehealth: Payer: Self-pay | Admitting: *Deleted

## 2013-08-03 MED ORDER — LOSARTAN POTASSIUM 100 MG PO TABS: 50.0000 mg | ORAL_TABLET | Freq: Every day | ORAL | Status: DC

## 2013-08-03 NOTE — Telephone Encounter (Signed)
pt's mail pharmacy ([facility]) called asking for cozaar to be refilled. I verified dose with pt on the other line and then gave v/o to pharmacist. Son asked for 1 tab daily but will cut in 1/2 this way refill last longer.

## 2013-08-21 ENCOUNTER — Encounter: Payer: Medicare Other | Admitting: *Deleted

## 2013-08-22 NOTE — Telephone Encounter (Signed)
New Prob  Pt son wants to know if the doctor can call something in for his fathers hip. He said that he has arthritis and it having some pain.

## 2013-08-22 NOTE — Telephone Encounter (Signed)
Will forward to  Dr. Brackbill for review 

## 2013-08-22 NOTE — Telephone Encounter (Signed)
Left message to call back  

## 2013-08-22 NOTE — Telephone Encounter (Signed)
The naproxen is for arthritis.  He is he taking that?

## 2013-08-24 NOTE — Telephone Encounter (Signed)
Never received call back, left message if still needed to speak with me to call back

## 2013-08-27 ENCOUNTER — Ambulatory Visit (INDEPENDENT_AMBULATORY_CARE_PROVIDER_SITE_OTHER): Payer: Medicare Other | Admitting: Cardiology

## 2013-08-27 VITALS — BP 164/74 | HR 62 | Ht 71.0 in | Wt 178.0 lb

## 2013-08-27 DIAGNOSIS — I259 Chronic ischemic heart disease, unspecified: Secondary | ICD-10-CM

## 2013-08-27 DIAGNOSIS — M1990 Unspecified osteoarthritis, unspecified site: Secondary | ICD-10-CM | POA: Insufficient documentation

## 2013-08-27 DIAGNOSIS — I5023 Acute on chronic systolic (congestive) heart failure: Secondary | ICD-10-CM

## 2013-08-27 DIAGNOSIS — I509 Heart failure, unspecified: Secondary | ICD-10-CM

## 2013-08-27 DIAGNOSIS — I2510 Atherosclerotic heart disease of native coronary artery without angina pectoris: Secondary | ICD-10-CM

## 2013-08-27 MED ORDER — MELOXICAM 15 MG PO TABS: 15.0000 mg | ORAL_TABLET | Freq: Every day | ORAL | Status: DC

## 2013-08-27 NOTE — Progress Notes (Signed)
Benjamin Noble Date of Birth:  1922-08-16 [facility] [address] [place], [place]  [address] [phone]         Fax   [phone]  History of Present Illness: This pleasant 79 year old gentleman is seen for a scheduled 3 month followup office visit. The patient has a history of ischemic heart disease. He has had coronary artery bypass graft surgery twice, most recently in 1992 which was a redo. He has chronic angina that is managed medically. Last week he had a more severe episode of chest discomfort which left him feeling weak.  His EKG today does not show any new EKG changes. He has LV dysfunction, hypertension, diabetes mellitus, and GERD. He was recently found to have prolonged pauses on Holter monitoring and subsequently underwent a pacemaker implantation successfully. The patient has a history of hypothyroidism and is on thyroid replacement. Patient has significant osteoarthritis.  He has had worsening arthritis of the right hip. He is a recent widower.   Current Outpatient Prescriptions  Medication Sig Dispense Refill  . ALPRAZolam (XANAX) 0.25 MG tablet Take 1 tablet (0.25 mg total) by mouth 2 (two) times daily as needed.  180 tablet  1  . aspirin EC 81 MG tablet Take 1 tablet (81 mg total) by mouth daily. Chest pain  90 tablet  3  . Cyanocobalamin (VITAMIN B 12 PO) Take 1 tablet by mouth every other day.       . docusate sodium (COLACE) 100 MG capsule Take 1 capsule (100 mg total) by mouth daily.  90 capsule  3  . ferrous sulfate 325 (65 FE) MG tablet Take 1 tablet (325 mg total) by mouth every other day.  45 tablet  3  . furosemide (LASIX) 20 MG tablet Take 20 mg by mouth daily.       . isosorbide mononitrate (IMDUR) 30 MG 24 hr tablet Take 1 tablet (30 mg total) by mouth 2 (two) times daily.  180 tablet  3  . levothyroxine (SYNTHROID) 75 MCG tablet Take 1 tablet (75 mcg total) by mouth daily.  90 tablet  3  . losartan (COZAAR) 100 MG tablet Take  0.5 tablets (50 mg total) by mouth daily. Take 1/2 tablet (50 mg) daily  90 tablet  3  . nitroGLYCERIN (NITROSTAT) 0.4 MG SL tablet Place 1 tablet (0.4 mg total) under the tongue every 5 (five) minutes as needed. For chest pain.  100 tablet  3  . pantoprazole (PROTONIX) 40 MG tablet Take 1 tablet (40 mg total) by mouth daily.  90 tablet  3  . amiodarone (PACERONE) 200 MG tablet Take 100 mg by mouth daily. Take 1/2 tablet (100 mg) daily      . fenofibrate (TRICOR) 48 MG tablet Take 1 tablet (48 mg total) by mouth daily.  90 tablet  3  . insulin glargine (LANTUS) 100 UNIT/ML injection Inject 18 Units into the skin at bedtime. 14-18 Units as directed, based on sugar levels.      . meloxicam (MOBIC) 15 MG tablet Take 1 tablet (15 mg total) by mouth daily.  30 tablet  5   No current facility-administered medications for this visit.    Allergies  Allergen Reactions  . Penicillins Anaphylaxis and Swelling  . Lipitor [Atorvastatin Calcium] Other (See Comments)    Gi issues  . Macrodantin Other (See Comments)    Unknown   . Morphine And Related Other (See Comments)    "doesn't feel right"  . Ranolazine Er  Other (See Comments)    constipation  . Statins Other (See Comments)    Leg weakness    Patient Active Problem List   Diagnosis Date Noted  . Angina pectoris 03/15/2011    Priority: High  . Hypertension     Priority: High  . Type II or unspecified type diabetes mellitus without mention of complication, uncontrolled 03/15/2011    Priority: Medium  . Hypercholesterolemia 03/15/2011    Priority: Medium  . Acute on chronic systolic CHF (congestive heart failure) 07/19/2012  . Hypothyroid 07/19/2012  . Constipation 07/19/2012  . Hypotension 07/18/2012  . Pacemaker-Medtronic 04/28/2012  . Acute myocardial infarction, subendocardial infarction, initial episode of care 04/27/2012  . Wide-complex tachycardia 04/26/2012  . Tachy-brady syndrome 04/26/2012  . Syncope 04/05/2012  .  Subarachnoid hemorrhage 04/05/2012  . CKD (chronic kidney disease), stage II 04/05/2012  . CAD (coronary artery disease) 04/05/2012  . Bundle branch block 04/05/2012  . S/P CABG (coronary artery bypass graft) 03/15/2011  . Anxiety associated with depression 03/15/2011    History  Smoking status  . Never Smoker   Smokeless tobacco  . Never Used    History  Alcohol Use No    Family History  Problem Relation Age of Onset  . Heart attack Father     Review of Systems: Constitutional: no fever chills diaphoresis or fatigue or change in weight.  Head and neck: no hearing loss, no epistaxis, no photophobia or visual disturbance. Respiratory: No cough, shortness of breath or wheezing. Cardiovascular: No chest pain peripheral edema, palpitations. Gastrointestinal: No abdominal distention, no abdominal pain, no change in bowel habits hematochezia or melena. Genitourinary: No dysuria, no frequency, no urgency, no nocturia. Musculoskeletal:No arthralgias, no back pain, no gait disturbance or myalgias. Neurological: No dizziness, no headaches, no numbness, no seizures, no syncope, no weakness, no tremors. Hematologic: No lymphadenopathy, no easy bruising. Psychiatric: No confusion, no hallucinations, no sleep disturbance.    Physical Exam: Filed Vitals:   08/27/13 1155  BP: 164/74  Pulse: 62   the general appearance reveals an elderly gentleman in a wheelchair.  He is in jovial spirits.The head and neck exam reveals pupils equal and reactive.  Extraocular movements are full.  There is no scleral icterus.  The mouth and pharynx are normal.  The neck is supple.  The carotids reveal no bruits.  The jugular venous pressure is normal.  The  thyroid is not enlarged.  There is no lymphadenopathy.  The chest is clear to percussion and auscultation.  There are no rales or rhonchi.  Expansion of the chest is symmetrical.  The precordium is quiet.  The first heart sound is normal.  The second heart  sound is physiologically split.  There is no murmur gallop rub or click.  There is no abnormal lift or heave.  The abdomen is soft and nontender.  The bowel sounds are normal.  The liver and spleen are not enlarged.  There are no abdominal masses.  There are no abdominal bruits.  Extremities reveal good pedal pulses.  There is no phlebitis or edema.  There is no cyanosis or clubbing.  Strength is normal and symmetrical in all extremities.  There is no lateralizing weakness.  There are no sensory deficits.  The skin is warm and dry.  There is no rash.  EKG shows atrial pacing with bifascicular block unchanged since 07/18/12.  Assessment / Plan: Continue on same medication.  Recheck in 2 months for office visit fasting lipid panel hepatic function panel  basal metabolic panel and hemoglobin E4V

## 2013-08-27 NOTE — Assessment & Plan Note (Signed)
The patient has been having worsening osteoarthritis discomfort on motion of the right hip.  He has been using Tylenol and has been using Aleve both of which helped partially.  He would like to try meloxicam which may be more effective.  His son uses meloxicam.  We will prescribe meloxicam 15 mg one daily and observe response

## 2013-08-27 NOTE — Assessment & Plan Note (Signed)
The patient has known severe ischemic heart disease which is being managed medically.  We do not anticipate any further invasive procedures.

## 2013-08-27 NOTE — Assessment & Plan Note (Signed)
The patient has a history of acute on chronic systolic congestive heart failure.  He has not been having any recent paroxysmal nocturnal dyspnea or significant ankle edema.

## 2013-08-27 NOTE — Patient Instructions (Signed)
START MELOXICAN 15 MG DAILY  Your physician recommends that you schedule a follow-up appointment in: 3 months with fasting labs (LP/BMET/HFP/A1C)

## 2013-08-29 ENCOUNTER — Encounter: Payer: Self-pay | Admitting: *Deleted

## 2013-09-03 ENCOUNTER — Ambulatory Visit (INDEPENDENT_AMBULATORY_CARE_PROVIDER_SITE_OTHER): Payer: Medicare Other | Admitting: *Deleted

## 2013-09-03 DIAGNOSIS — Z950 Presence of cardiac pacemaker: Secondary | ICD-10-CM

## 2013-09-03 DIAGNOSIS — I495 Sick sinus syndrome: Secondary | ICD-10-CM

## 2013-09-05 LAB — REMOTE PACEMAKER DEVICE
AL AMPLITUDE: 2.8 mv
AL IMPEDENCE PM: 467 Ohm
ATRIAL PACING PM: 71
BATTERY VOLTAGE: NEGATIVE V
RV LEAD IMPEDENCE PM: 559 Ohm
RV LEAD THRESHOLD: 0.875 V
VENTRICULAR PACING PM: 22

## 2013-09-18 ENCOUNTER — Encounter: Payer: Self-pay | Admitting: *Deleted

## 2013-09-22 ENCOUNTER — Encounter: Payer: Self-pay | Admitting: Internal Medicine

## 2013-10-11 ENCOUNTER — Telehealth: Payer: Self-pay | Admitting: Cardiology
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
## 2013-11-26 ENCOUNTER — Encounter: Payer: Self-pay | Admitting: Cardiology

## 2013-11-26 ENCOUNTER — Ambulatory Visit (INDEPENDENT_AMBULATORY_CARE_PROVIDER_SITE_OTHER): Payer: Medicare Other | Admitting: Cardiology

## 2013-11-26 ENCOUNTER — Other Ambulatory Visit (INDEPENDENT_AMBULATORY_CARE_PROVIDER_SITE_OTHER): Payer: Medicare Other

## 2013-11-26 VITALS — BP 140/84 | HR 65 | Ht 71.0 in | Wt 180.8 lb

## 2013-11-26 DIAGNOSIS — M1990 Unspecified osteoarthritis, unspecified site: Secondary | ICD-10-CM

## 2013-11-26 DIAGNOSIS — I495 Sick sinus syndrome: Secondary | ICD-10-CM

## 2013-11-26 DIAGNOSIS — I259 Chronic ischemic heart disease, unspecified: Secondary | ICD-10-CM

## 2013-11-26 DIAGNOSIS — IMO0001 Reserved for inherently not codable concepts without codable children: Secondary | ICD-10-CM

## 2013-11-26 DIAGNOSIS — I119 Hypertensive heart disease without heart failure: Secondary | ICD-10-CM

## 2013-11-26 DIAGNOSIS — I2510 Atherosclerotic heart disease of native coronary artery without angina pectoris: Secondary | ICD-10-CM

## 2013-11-26 LAB — LIPID PANEL
Cholesterol: 169 mg/dL (ref 0–200)
HDL: 34.1 mg/dL — ABNORMAL LOW (ref >39.00)
LDL Cholesterol: 110 mg/dL — ABNORMAL HIGH (ref 0–99)
Total CHOL/HDL Ratio: 5
Triglycerides: 124 mg/dL (ref 0.0–149.0)
VLDL: 24.8 mg/dL (ref 0.0–40.0)

## 2013-11-26 LAB — HEPATIC FUNCTION PANEL
Bilirubin, Direct: 0 mg/dL (ref 0.0–0.3)
Total Bilirubin: 0.5 mg/dL (ref 0.3–1.2)

## 2013-11-26 LAB — BASIC METABOLIC PANEL
BUN: 47 mg/dL — ABNORMAL HIGH (ref 6–23)
Calcium: 8.9 mg/dL (ref 8.4–10.5)
Creatinine, Ser: 2.2 mg/dL — ABNORMAL HIGH (ref 0.4–1.5)
GFR: 30.74 mL/min — ABNORMAL LOW (ref >60.00)

## 2013-11-26 LAB — HEMOGLOBIN A1C: Hgb A1c MFr Bld: 8.9 % — ABNORMAL HIGH (ref 4.6–6.5)

## 2013-11-26 NOTE — Patient Instructions (Signed)
Your physician recommends that you continue on your current medications as directed. Please refer to the Current Medication list given to you today.  Your physician recommends that you schedule a follow-up appointment in: 3 month ovbmet  

## 2013-11-26 NOTE — Assessment & Plan Note (Signed)
His blood pressures are still quite labile.  He tends to do a lot of snacking in the evening.  His family is using a sliding scale dosage on his insulins.  He has not had any hypoglycemic reactions clinically.  He does have significant diabetic neuropathy with lack of feeling in his feet and lower legs.

## 2013-11-26 NOTE — Progress Notes (Signed)
Benjamin Noble Date of Birth:  [birth year] [address] [place], [place]  [address] [phone]         Fax   [phone]  History of Present Illness: This pleasant 79 year old gentleman is seen for a scheduled 3 month followup office visit. The patient has a history of ischemic heart disease. He has had coronary artery bypass graft surgery twice, most recently in 1992 which was a redo. He has chronic angina that is managed medically.   His EKG today does not show any new EKG changes. He has LV dysfunction, hypertension, diabetes mellitus, and GERD. He was recently found to have prolonged pauses on Holter monitoring and subsequently underwent a pacemaker implantation successfully. The patient has a history of hypothyroidism and is on thyroid replacement. Patient has significant osteoarthritis.  He has had worsening arthritis of the right hip.  He also has significant arthritis of his back and we recently ordered him a back brace. He is a recent widower.  His son brought in some FMLA forms for [place] to sign.   Current Outpatient Prescriptions  Medication Sig Dispense Refill  . ALPRAZolam (XANAX) 0.25 MG tablet Take 1 tablet (0.25 mg total) by mouth 2 (two) times daily as needed.  180 tablet  1  . amiodarone (PACERONE) 200 MG tablet Take 100 mg by mouth daily. Take 1/2 tablet (100 mg) daily      . aspirin EC 81 MG tablet Take 1 tablet (81 mg total) by mouth daily. Chest pain  90 tablet  3  . Cyanocobalamin (VITAMIN B 12 PO) Take 1 tablet by mouth every other day.       . docusate sodium (COLACE) 100 MG capsule Take 1 capsule (100 mg total) by mouth daily.  90 capsule  3  . fenofibrate (TRICOR) 48 MG tablet Take 1 tablet (48 mg total) by mouth daily.  90 tablet  3  . ferrous sulfate 325 (65 FE) MG tablet Take 1 tablet (325 mg total) by mouth every other day.  45 tablet  3  . furosemide (LASIX) 20 MG tablet Take 20 mg by mouth daily.       . insulin glargine (LANTUS) 100  UNIT/ML injection Inject 18 Units into the skin at bedtime. 14-18 Units as directed, based on sugar levels.      . isosorbide mononitrate (IMDUR) 30 MG 24 hr tablet Take 1 tablet (30 mg total) by mouth 2 (two) times daily.  180 tablet  3  . levothyroxine (SYNTHROID) 75 MCG tablet Take 1 tablet (75 mcg total) by mouth daily.  90 tablet  3  . losartan (COZAAR) 100 MG tablet Take 0.5 tablets (50 mg total) by mouth daily. Take 1/2 tablet (50 mg) daily  90 tablet  3  . meloxicam (MOBIC) 15 MG tablet Take 1 tablet (15 mg total) by mouth daily.  30 tablet  5  . nitroGLYCERIN (NITROSTAT) 0.4 MG SL tablet Place 1 tablet (0.4 mg total) under the tongue every 5 (five) minutes as needed. For chest pain.  100 tablet  3  . pantoprazole (PROTONIX) 40 MG tablet Take 1 tablet (40 mg total) by mouth daily.  90 tablet  3   No current facility-administered medications for this visit.    Allergies  Allergen Reactions  . Penicillins Anaphylaxis and Swelling  . Lipitor [Atorvastatin Calcium] Other (See Comments)    Gi issues  . Macrodantin Other (See Comments)    Unknown   . Morphine And Related Other (  See Comments)    "doesn't feel right"  . Ranolazine Er Other (See Comments)    constipation  . Statins Other (See Comments)    Leg weakness    Patient Active Problem List   Diagnosis Date Noted  . Angina pectoris 03/15/2011    Priority: High  . Hypertension     Priority: High  . Type II or unspecified type diabetes mellitus without mention of complication, uncontrolled 03/15/2011    Priority: Medium  . Hypercholesterolemia 03/15/2011    Priority: Medium  . Osteoarthritis 08/27/2013  . Acute on chronic systolic CHF (congestive heart failure) 07/19/2012  . Hypothyroid 07/19/2012  . Constipation 07/19/2012  . Hypotension 07/18/2012  . Pacemaker-Medtronic 04/28/2012  . Acute myocardial infarction, subendocardial infarction, initial episode of care 04/27/2012  . Wide-complex tachycardia 04/26/2012  .  Tachy-brady syndrome 04/26/2012  . Syncope 04/05/2012  . Subarachnoid hemorrhage 04/05/2012  . CKD (chronic kidney disease), stage II 04/05/2012  . CAD (coronary artery disease) 04/05/2012  . Bundle branch block 04/05/2012  . S/P CABG (coronary artery bypass graft) 03/15/2011  . Anxiety associated with depression 03/15/2011    History  Smoking status  . Never Smoker   Smokeless tobacco  . Never Used    History  Alcohol Use No    Family History  Problem Relation Age of Onset  . Heart attack Father     Review of Systems: Constitutional: no fever chills diaphoresis or fatigue or change in weight.  Head and neck: no hearing loss, no epistaxis, no photophobia or visual disturbance. Respiratory: No cough, shortness of breath or wheezing. Cardiovascular: No chest pain peripheral edema, palpitations. Gastrointestinal: No abdominal distention, no abdominal pain, no change in bowel habits hematochezia or melena. Genitourinary: No dysuria, no frequency, no urgency, no nocturia. Musculoskeletal:No arthralgias, no back pain, no gait disturbance or myalgias. Neurological: No dizziness, no headaches, no numbness, no seizures, no syncope, no weakness, no tremors. Hematologic: No lymphadenopathy, no easy bruising. Psychiatric: No confusion, no hallucinations, no sleep disturbance.    Physical Exam: Filed Vitals:   11/26/13 1008  BP: 140/84  Pulse: 65   the general appearance reveals an elderly gentleman in a wheelchair.  He is in jovial spirits.The head and neck exam reveals pupils equal and reactive.  Extraocular movements are full.  There is no scleral icterus.  The mouth and pharynx are normal.  The neck is supple.  The carotids reveal no bruits.  The jugular venous pressure is normal.  The  thyroid is not enlarged.  There is no lymphadenopathy.  The chest is clear to percussion and auscultation.  There are no rales or rhonchi.  Expansion of the chest is symmetrical.  The precordium is  quiet.  The first heart sound is normal.  The second heart sound is physiologically split.  There is no murmur gallop rub or click.  There is no abnormal lift or heave.  The abdomen is soft and nontender.  The bowel sounds are normal.  The liver and spleen are not enlarged.  There are no abdominal masses.  There are no abdominal bruits.  Extremities reveal good pedal pulses.  There is no phlebitis or edema.  There is no cyanosis or clubbing.  Strength is normal and symmetrical in all extremities.  There is no lateralizing weakness.  There are no sensory deficits.  The skin is warm and dry.  There is no rash.  EKG shows atrial pacing with bifascicular block.  Assessment / Plan: Continue on same medication.  Recheck in 3 months for office visit and basal metabolic panel

## 2013-11-26 NOTE — Progress Notes (Signed)
Quick Note:  Please report to patient. The recent labs are stable. Continue same medication and careful diet. BS and A1C still too high. Cut back on sweets and carbs. BUN high so drink more water. ______

## 2013-11-26 NOTE — Assessment & Plan Note (Signed)
The patient states that his frequency of chest pain has increased recently and has had to take nitroglycerin sublingually more frequently.  He is sedentary.

## 2013-11-26 NOTE — Assessment & Plan Note (Signed)
The patient has not been aware of any racing of his heart.  His EKG today shows AV paced rhythm.

## 2013-11-27 ENCOUNTER — Telehealth: Payer: Self-pay | Admitting: Cardiology

## 2013-11-27 NOTE — Telephone Encounter (Signed)
Spoke with Benjamin Noble and ok to proceed with hospice.

## 2013-11-27 NOTE — Telephone Encounter (Signed)
New problem    Pt's son called and is interested in Hospice seeing the pt. They need an order for pt to see hospice. Need to know if Dr Patty Sermons will be the attending phy for hospice and if so would he want the hospice doc to help with symptom management. Please advise

## 2013-11-27 NOTE — Telephone Encounter (Signed)
Yes okay for hospice to see patient.  I can be his attending.  I would like the hospice physician to help with management.

## 2013-11-27 NOTE — Telephone Encounter (Signed)
Will forward to  Dr. Brackbill for review 

## 2013-11-29 NOTE — Telephone Encounter (Signed)
New message     Returning nurses call regarding father.

## 2013-12-03 ENCOUNTER — Encounter: Payer: Self-pay | Admitting: Internal Medicine

## 2013-12-03 ENCOUNTER — Ambulatory Visit (INDEPENDENT_AMBULATORY_CARE_PROVIDER_SITE_OTHER): Admitting: *Deleted

## 2013-12-03 DIAGNOSIS — Z950 Presence of cardiac pacemaker: Secondary | ICD-10-CM

## 2013-12-03 DIAGNOSIS — I495 Sick sinus syndrome: Secondary | ICD-10-CM

## 2013-12-03 LAB — MDC_IDC_ENUM_SESS_TYPE_REMOTE
Battery Remaining Longevity: 119 mo
Battery Voltage: 2.79 V
Brady Statistic AP VP Percent: 19 %
Brady Statistic AP VS Percent: 46 %
Brady Statistic AS VP Percent: 0 %
Date Time Interrogation Session: 20141215214417
Lead Channel Pacing Threshold Amplitude: 0.5 V
Lead Channel Pacing Threshold Amplitude: 0.75 V
Lead Channel Pacing Threshold Pulse Width: 0.4 ms
Lead Channel Sensing Intrinsic Amplitude: 2.8 mV
Lead Channel Sensing Intrinsic Amplitude: 22.4 mV
Lead Channel Setting Pacing Pulse Width: 0.4 ms

## 2013-12-05 ENCOUNTER — Telehealth: Payer: Self-pay | Admitting: Cardiology

## 2013-12-05 NOTE — Telephone Encounter (Signed)
Spoke with patients son and advised needed to be paid prior to completion. Stated he will come by and pay

## 2013-12-05 NOTE — Telephone Encounter (Signed)
New message    Want Juliette Alcide to know that he will be by Monday to pay for FMLA form and to pick up the completed form.

## 2013-12-07 ENCOUNTER — Emergency Department (HOSPITAL_COMMUNITY)
Admission: EM | Admit: 2013-12-07 | Discharge: 2013-12-07 | Disposition: A | Discharge status: Home / Self Care | Attending: Emergency Medicine | Admitting: Emergency Medicine

## 2013-12-07 ENCOUNTER — Emergency Department (HOSPITAL_COMMUNITY)

## 2013-12-07 ENCOUNTER — Encounter (HOSPITAL_COMMUNITY): Payer: Self-pay | Admitting: Emergency Medicine

## 2013-12-07 DIAGNOSIS — N189 Chronic kidney disease, unspecified: Secondary | ICD-10-CM | POA: Insufficient documentation

## 2013-12-07 DIAGNOSIS — Z79899 Other long term (current) drug therapy: Secondary | ICD-10-CM | POA: Insufficient documentation

## 2013-12-07 DIAGNOSIS — Z794 Long term (current) use of insulin: Secondary | ICD-10-CM | POA: Insufficient documentation

## 2013-12-07 DIAGNOSIS — R5381 Other malaise: Secondary | ICD-10-CM | POA: Insufficient documentation

## 2013-12-07 DIAGNOSIS — J45909 Unspecified asthma, uncomplicated: Secondary | ICD-10-CM | POA: Insufficient documentation

## 2013-12-07 DIAGNOSIS — Z7982 Long term (current) use of aspirin: Secondary | ICD-10-CM | POA: Insufficient documentation

## 2013-12-07 DIAGNOSIS — I252 Old myocardial infarction: Secondary | ICD-10-CM | POA: Insufficient documentation

## 2013-12-07 DIAGNOSIS — R32 Unspecified urinary incontinence: Secondary | ICD-10-CM | POA: Insufficient documentation

## 2013-12-07 DIAGNOSIS — F329 Major depressive disorder, single episode, unspecified: Secondary | ICD-10-CM | POA: Insufficient documentation

## 2013-12-07 DIAGNOSIS — R0602 Shortness of breath: Secondary | ICD-10-CM | POA: Insufficient documentation

## 2013-12-07 DIAGNOSIS — Z950 Presence of cardiac pacemaker: Secondary | ICD-10-CM | POA: Insufficient documentation

## 2013-12-07 DIAGNOSIS — Z888 Allergy status to other drugs, medicaments and biological substances status: Secondary | ICD-10-CM | POA: Insufficient documentation

## 2013-12-07 DIAGNOSIS — R079 Chest pain, unspecified: Secondary | ICD-10-CM

## 2013-12-07 DIAGNOSIS — Z880 Allergy status to penicillin: Secondary | ICD-10-CM | POA: Insufficient documentation

## 2013-12-07 DIAGNOSIS — Z8679 Personal history of other diseases of the circulatory system: Secondary | ICD-10-CM | POA: Insufficient documentation

## 2013-12-07 DIAGNOSIS — G709 Myoneural disorder, unspecified: Secondary | ICD-10-CM | POA: Insufficient documentation

## 2013-12-07 DIAGNOSIS — I209 Angina pectoris, unspecified: Secondary | ICD-10-CM | POA: Insufficient documentation

## 2013-12-07 DIAGNOSIS — K219 Gastro-esophageal reflux disease without esophagitis: Secondary | ICD-10-CM | POA: Insufficient documentation

## 2013-12-07 DIAGNOSIS — I259 Chronic ischemic heart disease, unspecified: Secondary | ICD-10-CM | POA: Insufficient documentation

## 2013-12-07 DIAGNOSIS — Z881 Allergy status to other antibiotic agents status: Secondary | ICD-10-CM | POA: Insufficient documentation

## 2013-12-07 DIAGNOSIS — R072 Precordial pain: Secondary | ICD-10-CM | POA: Insufficient documentation

## 2013-12-07 DIAGNOSIS — Z885 Allergy status to narcotic agent status: Secondary | ICD-10-CM | POA: Insufficient documentation

## 2013-12-07 DIAGNOSIS — E7800 Pure hypercholesterolemia, unspecified: Secondary | ICD-10-CM | POA: Insufficient documentation

## 2013-12-07 DIAGNOSIS — I4510 Unspecified right bundle-branch block: Secondary | ICD-10-CM | POA: Insufficient documentation

## 2013-12-07 DIAGNOSIS — I2510 Atherosclerotic heart disease of native coronary artery without angina pectoris: Secondary | ICD-10-CM | POA: Insufficient documentation

## 2013-12-07 DIAGNOSIS — E119 Type 2 diabetes mellitus without complications: Secondary | ICD-10-CM | POA: Insufficient documentation

## 2013-12-07 DIAGNOSIS — I4891 Unspecified atrial fibrillation: Secondary | ICD-10-CM | POA: Insufficient documentation

## 2013-12-07 DIAGNOSIS — I10 Essential (primary) hypertension: Secondary | ICD-10-CM | POA: Insufficient documentation

## 2013-12-07 DIAGNOSIS — F3289 Other specified depressive episodes: Secondary | ICD-10-CM | POA: Insufficient documentation

## 2013-12-07 DIAGNOSIS — Z8669 Personal history of other diseases of the nervous system and sense organs: Secondary | ICD-10-CM | POA: Insufficient documentation

## 2013-12-07 DIAGNOSIS — Z8709 Personal history of other diseases of the respiratory system: Secondary | ICD-10-CM | POA: Insufficient documentation

## 2013-12-07 LAB — CBC
HCT: 42.6 % (ref 39.0–52.0)
MCH: 31.5 pg (ref 26.0–34.0)
MCHC: 34.7 g/dL (ref 30.0–36.0)
MCV: 90.6 fL (ref 78.0–100.0)
RBC: 4.7 MIL/uL (ref 4.22–5.81)
RDW: 12.8 % (ref 11.5–15.5)
WBC: 8.1 10*3/uL (ref 4.0–10.5)

## 2013-12-07 LAB — BASIC METABOLIC PANEL
BUN: 40 mg/dL — ABNORMAL HIGH (ref 6–23)
CO2: 25 mEq/L (ref 19–32)
Calcium: 9.4 mg/dL (ref 8.4–10.5)
Chloride: 98 mEq/L (ref 96–112)
Creatinine, Ser: 1.86 mg/dL — ABNORMAL HIGH (ref 0.50–1.35)
GFR calc non Af Amer: 30 mL/min — ABNORMAL LOW (ref >90)

## 2013-12-07 LAB — TROPONIN I: Troponin I: <0.3 ng/mL (ref <0.30)

## 2013-12-07 MED ORDER — ASPIRIN EC 325 MG PO TBEC
325.0000 mg | DELAYED_RELEASE_TABLET | Freq: Once | ORAL | Status: AC
Start: 2013-12-07 — End: 2013-12-07
  Administered 2013-12-07: 325 mg via ORAL
  Filled 2013-12-07: qty 1

## 2013-12-07 NOTE — ED Notes (Signed)
PT had chest discomfort and gave himself 2 nitro which helped pain; has pacemaker in place which is firing; EMS gave a 3rd nitro. Pt has 2/10 chest pain currently. No N,V, diaphoresis with chest pain.

## 2013-12-07 NOTE — ED Provider Notes (Signed)
I saw and evaluated the patient, reviewed the resident's note and I agree with the findings and plan.  EKG Interpretation    Date/Time:  Friday December 07 2013 16:29:12 EST Ventricular Rate:  69 PR Interval:    QRS Duration: 173 QT Interval:  493 QTC Calculation: 528 R Axis:   -57 Text Interpretation:  Atrial fibrillation Right bundle branch block Abnormal T, consider ischemia, lateral leads Since last tracing pacer spikes no longer apparent Confirmed by SHELDON  MD, CHARLES ([id number]) on 12/07/2013 4:36:01 PM            Pt with stable angina, also on Hospice, pain controlled with NTG at home,labs normal here, no change in EKG. Pt wants to go home.   Charles B. Bernette Mayers, MD 12/07/13 [phone]

## 2013-12-07 NOTE — ED Provider Notes (Signed)
CSN: [id number]     Arrival date & time 12/07/13  1617 History   None    Chief Complaint  Patient presents with  . Chest Pain   (Consider location/radiation/quality/duration/timing/severity/associated sxs/prior Treatment) HPI Mr. Moataz Tavis is a 79 y.o. male w/ PMHx of CAD s/p CABG x2 (1984, redo in 1992), PPM in place, HTN, CHF (EF 35-40%, DM type II, GERD, presents to the ED w/ complaints of chest pain. Patient has a h/o chronic angina, currently on home hospice for severe cardiac disease, started last week. Patient claims his home hospice nurse was concerned as his BP was also elevated this AM, in the setting of chest pain, needed to be brought to the hospital. The patient claims his pain is his typical anginal pain, substernal, only moderately painful, w/out radiation. Patient admits to some associated SOB, but denies nausea, vomiting, or diaphoresis.   Past Medical History  Diagnosis Date  . Ischemic heart disease     original CABG in 1984 with redo CABG in 1992; managed medically  . HTN (hypertension)   . Angina pectoris   . Hypercholesterolemia   . CHF (congestive heart failure)     EF is 35 to 40% per echo April 2013  . Arthritis   . Urinary incontinence   . Myocardial infarction   . Dysrhythmia     irregular  heart beats per patient  . Asthma   . Shortness of breath   . Diabetes mellitus     type 2  . GERD (gastroesophageal reflux disease)   . Neuromuscular disorder     periferal neruropathy  . Depression   . Syncope   . Subarachnoid hemorrhage April 2013    from fall/coumadin & aspirin stopped  . Coronary artery disease   . Pacemaker   . Chronic kidney disease     hx of acute renal failure   Past Surgical History  Procedure Laterality Date  . Cardiac catheterization  03/18/1998  . Coronary artery bypass graft  4132,4401    first at [facility]  . Circumcision  02/05/08  . Eye surgery    . Cardiac stents    . Insert / replace / remove pacemaker   05/2012   Family History  Problem Relation Age of Onset  . Heart attack Father    History  Substance Use Topics  . Smoking status: Never Smoker   . Smokeless tobacco: Never Used  . Alcohol Use: No    Review of Systems General: Positive for fatigue. Denies fever, chills, diaphoresis, appetite change.  Respiratory: Positive for SOB. Denies cough, chest tightness, and wheezing.   Cardiovascular: Positive for chest pain. Denies palpitations and leg swelling.  Gastrointestinal: Denies nausea, vomiting, abdominal pain, diarrhea, constipation, blood in stool and abdominal distention.  Genitourinary: Positive for chronic urinary incontinence. Denies dysuria, urgency, frequency, hematuria, flank pain and difficulty urinating.  Endocrine: Denies hot or cold intolerance, sweats, polyuria, polydipsia. Musculoskeletal: Denies myalgias, back pain, joint swelling, arthralgias and gait problem.  Skin: Denies pallor, rash and wounds.  Neurological: Denies dizziness, seizures, syncope, weakness, lightheadedness, numbness and headaches.  Psychiatric/Behavioral: Denies mood changes, confusion, nervousness, sleep disturbance and agitation.  Allergies  Penicillins; Lipitor; Macrodantin; Morphine and related; Ranolazine er; and Statins  Home Medications   Current Outpatient Rx  Name  Route  Sig  Dispense  Refill  . ALPRAZolam (XANAX) 0.25 MG tablet   Oral   Take 1 tablet (0.25 mg total) by mouth 2 (two) times daily as needed.  180 tablet   1   . amiodarone (PACERONE) 200 MG tablet   Oral   Take 100 mg by mouth daily. Take 1/2 tablet (100 mg) daily         . aspirin EC 81 MG tablet   Oral   Take 1 tablet (81 mg total) by mouth daily. Chest pain   90 tablet   3   . Cyanocobalamin (VITAMIN B 12 PO)   Oral   Take 1 tablet by mouth every other day.          . docusate sodium (COLACE) 100 MG capsule   Oral   Take 1 capsule (100 mg total) by mouth daily.   90 capsule   3   .  fenofibrate (TRICOR) 48 MG tablet   Oral   Take 1 tablet (48 mg total) by mouth daily.   90 tablet   3   . ferrous sulfate 325 (65 FE) MG tablet   Oral   Take 1 tablet (325 mg total) by mouth every other day.   45 tablet   3   . furosemide (LASIX) 20 MG tablet   Oral   Take 20 mg by mouth daily.          . insulin glargine (LANTUS) 100 UNIT/ML injection   Subcutaneous   Inject 14 Units into the skin at bedtime. 14-18 Units as directed, based on sugar levels.         . isosorbide mononitrate (IMDUR) 30 MG 24 hr tablet   Oral   Take 1 tablet (30 mg total) by mouth 2 (two) times daily.   180 tablet   3   . levothyroxine (SYNTHROID) 75 MCG tablet   Oral   Take 1 tablet (75 mcg total) by mouth daily.   90 tablet   3     THIS IS A NEW DOSE   . losartan (COZAAR) 100 MG tablet   Oral   Take 0.5 tablets (50 mg total) by mouth daily. Take 1/2 tablet (50 mg) daily   90 tablet   3   . nitroGLYCERIN (NITROSTAT) 0.4 MG SL tablet   Sublingual   Place 1 tablet (0.4 mg total) under the tongue every 5 (five) minutes as needed. For chest pain.   100 tablet   3   . pantoprazole (PROTONIX) 40 MG tablet   Oral   Take 1 tablet (40 mg total) by mouth daily.   90 tablet   3    BP 171/71  Pulse 61  Resp 24  SpO2 97% Physical Exam Filed Vitals:   12/07/13 1630 12/07/13 1635 12/07/13 1645 12/07/13 1700  BP: 185/87 185/87 180/84 171/71  Pulse: 68  65 61  Resp: 18 15 18 24   SpO2: 97% 97% 98% 97%  General: Vital signs reviewed.  Patient is a frial elderly male, in no acute distress and cooperative with exam. Alert and oriented x3.  Head: Normocephalic and atraumatic. Eyes: PERRL, EOMI, conjunctivae normal, No scleral icterus.  Neck: Supple, trachea midline, normal ROM, No JVD, masses, thyromegaly, or carotid bruit present.  Cardiovascular: RRR, S1 normal, S2 normal, no murmurs, gallops, or rubs. PPM in place.  Pulmonary/Chest: Normal respiratory effort, CTAB, no wheezes,  rales, or rhonchi. Abdominal: Soft, non-tender, non-distended, bowel sounds are normal, no masses, organomegaly, or guarding present.  Musculoskeletal: No joint deformities, erythema, or stiffness, ROM full and no nontender. Extremities: No swelling or edema,  pulses symmetric and intact bilaterally. No cyanosis or clubbing. Neurological:  A&O x3, Strength is normal and symmetric bilaterally, cranial nerve II-XII are grossly intact, no focal motor deficit, sensory intact to light touch bilaterally.  Skin: Warm, dry and intact. No rashes or erythema. Psychiatric: Normal mood and affect. speech and behavior is normal. Cognition and memory are normal.   ED Course  Procedures (including critical care time) Labs Review Labs Reviewed  TROPONIN I  BASIC METABOLIC PANEL  CBC   Imaging Review No results found.  EKG Interpretation    Date/Time:  Friday December 07 2013 16:29:12 EST Ventricular Rate:  69 PR Interval:    QRS Duration: 173 QT Interval:  493 QTC Calculation: 528 R Axis:   -57 Text Interpretation:  Atrial fibrillation Right bundle branch block Abnormal T, consider ischemia, lateral leads Since last tracing pacer spikes no longer apparent Confirmed by SHELDON  MD, CHARLES [phone]) on 12/07/2013 4:36:01 PM            MDM   Mr. Stacy Deshler is a 79 y.o. male w/ PMHx of CAD s/p CABG x2 (1984, redo in 1992), PPM in place, HTN, CHF (EF 35-40%, DM type II, GERD, presents to the ED w/ complaints of chest pain, typical of his previous episodes of anginal chest pain. Relieved by NTG. Patient in home hospice, DNR/DNI. Patient says nurse was worried about his elevated BP in the setting of his chest pain and felt it necessary to have him brought to the ED.  -EKG as above.  -ASA 325 mg given in ED -Troponin -ve x1 -CBC wnl -BMET w/ Cr of 1.86. Improved from previous. Otherwise wnl. -CXR shows no acute disease.   Patient stable to be discharged home in the care of home  hospice nurses. Discussed hospice care with patient and family.     Courtney Paris, MD 12/07/13 1911

## 2013-12-17 ENCOUNTER — Encounter: Payer: Self-pay | Admitting: *Deleted

## 2014-02-22 ENCOUNTER — Telehealth: Payer: Self-pay | Admitting: Cardiology

## 2014-02-22 NOTE — Telephone Encounter (Signed)
Left message with daughter-in-law, son at work

## 2014-02-22 NOTE — Telephone Encounter (Signed)
New message     bp is running high----190/91; 180/85.  Have an appt on Monday---do they need to adjust his medication for the weekend?

## 2014-02-22 NOTE — Telephone Encounter (Signed)
Increase losartan to 100mg daily

## 2014-02-22 NOTE — Telephone Encounter (Signed)
Will forward to  Dr. Brackbill for review 

## 2014-02-25 ENCOUNTER — Encounter: Payer: Self-pay | Admitting: Cardiology

## 2014-02-25 ENCOUNTER — Encounter (INDEPENDENT_AMBULATORY_CARE_PROVIDER_SITE_OTHER): Payer: Self-pay

## 2014-02-25 ENCOUNTER — Ambulatory Visit (INDEPENDENT_AMBULATORY_CARE_PROVIDER_SITE_OTHER): Admitting: Cardiology

## 2014-02-25 VITALS — BP 150/84 | HR 65 | Ht 71.0 in | Wt 186.0 lb

## 2014-02-25 DIAGNOSIS — I259 Chronic ischemic heart disease, unspecified: Secondary | ICD-10-CM

## 2014-02-25 DIAGNOSIS — I2510 Atherosclerotic heart disease of native coronary artery without angina pectoris: Secondary | ICD-10-CM

## 2014-02-25 DIAGNOSIS — I119 Hypertensive heart disease without heart failure: Secondary | ICD-10-CM

## 2014-02-25 DIAGNOSIS — IMO0001 Reserved for inherently not codable concepts without codable children: Secondary | ICD-10-CM

## 2014-02-25 DIAGNOSIS — I10 Essential (primary) hypertension: Secondary | ICD-10-CM

## 2014-02-25 DIAGNOSIS — I454 Nonspecific intraventricular block: Secondary | ICD-10-CM

## 2014-02-25 DIAGNOSIS — E1165 Type 2 diabetes mellitus with hyperglycemia: Secondary | ICD-10-CM

## 2014-02-25 LAB — BASIC METABOLIC PANEL
BUN: 42 mg/dL — ABNORMAL HIGH (ref 6–23)
CHLORIDE: 103 meq/L (ref 96–112)
CO2: 25 meq/L (ref 19–32)
Calcium: 8.6 mg/dL (ref 8.4–10.5)
Creatinine, Ser: 2.3 mg/dL — ABNORMAL HIGH (ref 0.4–1.5)
GFR: 29.3 mL/min — ABNORMAL LOW (ref >60.00)
Glucose, Bld: 273 mg/dL — ABNORMAL HIGH (ref 70–99)
POTASSIUM: 4.5 meq/L (ref 3.5–5.1)
Sodium: 137 mEq/L (ref 135–145)

## 2014-02-25 LAB — HEMOGLOBIN A1C: HEMOGLOBIN A1C: 8.8 % — AB (ref 4.6–6.5)

## 2014-02-25 NOTE — Patient Instructions (Signed)
Will obtain labs today and call you with the results (BMET/A1C)  Your physician recommends that you continue on your current medications as directed. Please refer to the Current Medication list given to you today.  Your physician recommends that you schedule a follow-up appointment in: 3 MONTH OV

## 2014-02-25 NOTE — Assessment & Plan Note (Signed)
The patient has not been having any hypoglycemic episodes.  His Lantus insulin has now been increased to between 20 and 23 units at bedtime based on a sliding scale.  We are checking and A1c today.  The patient is scheduled to have an eye exam by his ophthalmologist next month and his ophthalmologist was anxious to know about the A1c result also.

## 2014-02-25 NOTE — Assessment & Plan Note (Signed)
Since we last saw him he had a visit to the emergency room for chest pain on 12/07/13.  EKG showed atrial fibrillation with right bundle branch block and abnormal T waves his troponins were negative CBC was normal basal metabolic panel showed creatinine of 1.86 and chest x-ray showed no acute disease.  He was able to be discharged home to the care of the home hospice nurse.  Since last visit he has not had any severe or prolonged discomfort.

## 2014-02-25 NOTE — Progress Notes (Signed)
Benjamin RavelingJames Vernon Noble Date of Birth:  20-Jan-1922 [address] [place], [place]  [id number] [phone]         Fax   [phone]  History of Present Illness: This pleasant 80 year old gentleman is seen for a scheduled 3 month followup office visit. The patient has a history of ischemic heart disease. He has had coronary artery bypass graft surgery twice, most recently in 1992 which was a redo. He has chronic angina that is managed medically.    He has LV dysfunction, hypertension, diabetes mellitus, and GERD. He was recently found to have prolonged pauses on Holter monitoring and subsequently underwent a pacemaker implantation successfully. The patient has a history of hypothyroidism and is on thyroid replacement. Patient has significant osteoarthritis.  He has had worsening arthritis of the right hip.  He also has significant arthritis of his back and we recently ordered him a back brace.  He remains under hospice care.   Current Outpatient Prescriptions  Medication Sig Dispense Refill  . ALPRAZolam (XANAX) 0.25 MG tablet Take 1 tablet (0.25 mg total) by mouth 2 (two) times daily as needed.  180 tablet  1  . aspirin EC 81 MG tablet Take 1 tablet (81 mg total) by mouth daily. Chest pain  90 tablet  3  . Cyanocobalamin (VITAMIN B 12 PO) Take 1 tablet by mouth every other day.       . docusate sodium (COLACE) 100 MG capsule Take 1 capsule (100 mg total) by mouth daily.  90 capsule  3  . ferrous sulfate 325 (65 FE) MG tablet Take 1 tablet (325 mg total) by mouth every other day.  45 tablet  3  . furosemide (LASIX) 20 MG tablet Take 20 mg by mouth daily.       . isosorbide mononitrate (IMDUR) 30 MG 24 hr tablet Take 1 tablet (30 mg total) by mouth 2 (two) times daily.  180 tablet  3  . levothyroxine (SYNTHROID) 75 MCG tablet Take 1 tablet (75 mcg total) by mouth daily.  90 tablet  3  . losartan (COZAAR) 100 MG tablet Take 100 mg by mouth daily.      . nitroGLYCERIN (NITROSTAT)  0.4 MG SL tablet Place 1 tablet (0.4 mg total) under the tongue every 5 (five) minutes as needed. For chest pain.  100 tablet  3  . pantoprazole (PROTONIX) 40 MG tablet Take 1 tablet (40 mg total) by mouth daily.  90 tablet  3  . amiodarone (PACERONE) 200 MG tablet Take 100 mg by mouth daily. Take 1/2 tablet (100 mg) daily      . fenofibrate (TRICOR) 48 MG tablet Take 1 tablet (48 mg total) by mouth daily.  90 tablet  3  . insulin glargine (LANTUS) 100 UNIT/ML injection Inject 14 Units into the skin at bedtime. 20-23 Units as directed, based on sugar levels.       No current facility-administered medications for this visit.    Allergies  Allergen Reactions  . Penicillins Anaphylaxis and Swelling  . Lipitor [Atorvastatin Calcium] Other (See Comments)    Gi issues  . Macrodantin Other (See Comments)    Unknown   . Morphine And Related Other (See Comments)    "doesn't feel right"  . Ranolazine Er Other (See Comments)    constipation  . Statins Other (See Comments)    Leg weakness    Patient Active Problem List   Diagnosis Date Noted  . Angina pectoris 03/15/2011  Priority: High  . Hypertension     Priority: High  . Type II or unspecified type diabetes mellitus without mention of complication, uncontrolled 03/15/2011    Priority: Medium  . Hypercholesterolemia 03/15/2011    Priority: Medium  . Osteoarthritis 08/27/2013  . Acute on chronic systolic CHF (congestive heart failure) 07/19/2012  . Hypothyroid 07/19/2012  . Constipation 07/19/2012  . Hypotension 07/18/2012  . Pacemaker-Medtronic 04/28/2012  . Acute myocardial infarction, subendocardial infarction, initial episode of care 04/27/2012  . Wide-complex tachycardia 04/26/2012  . Tachy-brady syndrome 04/26/2012  . Syncope 04/05/2012  . Subarachnoid hemorrhage 04/05/2012  . CKD (chronic kidney disease), stage II 04/05/2012  . CAD (coronary artery disease) 04/05/2012  . Bundle branch block 04/05/2012  . S/P CABG  (coronary artery bypass graft) 03/15/2011  . Anxiety associated with depression 03/15/2011    History  Smoking status  . Never Smoker   Smokeless tobacco  . Never Used    History  Alcohol Use No    Family History  Problem Relation Age of Onset  . Heart attack Father     Review of Systems: Constitutional: no fever chills diaphoresis or fatigue or change in weight.  Head and neck: no hearing loss, no epistaxis, no photophobia or visual disturbance. Respiratory: No cough, shortness of breath or wheezing. Cardiovascular: No chest pain peripheral edema, palpitations. Gastrointestinal: No abdominal distention, no abdominal pain, no change in bowel habits hematochezia or melena. Genitourinary: No dysuria, no frequency, no urgency, no nocturia. Musculoskeletal:No arthralgias, no back pain, no gait disturbance or myalgias. Neurological: No dizziness, no headaches, no numbness, no seizures, no syncope, no weakness, no tremors. Hematologic: No lymphadenopathy, no easy bruising. Psychiatric: No confusion, no hallucinations, no sleep disturbance.    Physical Exam: Filed Vitals:   02/25/14 1059  BP: 150/84  Pulse: 65   the general appearance reveals an elderly gentleman in a wheelchair.  He is in jovial spirits.The head and neck exam reveals pupils equal and reactive.  Extraocular movements are full.  There is no scleral icterus.  The mouth and pharynx are normal.  The neck is supple.  The carotids reveal no bruits.  The jugular venous pressure is normal.  The  thyroid is not enlarged.  There is no lymphadenopathy.  The chest is clear to percussion and auscultation.  There are no rales or rhonchi.  Expansion of the chest is symmetrical.  The precordium is quiet.  The first heart sound is normal.  The second heart sound is physiologically split.  There is no murmur gallop rub or click.  There is no abnormal lift or heave.  The abdomen is soft and nontender.  The bowel sounds are normal.  The  liver and spleen are not enlarged.  There are no abdominal masses.  There are no abdominal bruits.  Extremities reveal good pedal pulses.  There is no phlebitis or edema.  There is no cyanosis or clubbing.  Strength is normal and symmetrical in all extremities.  There is no lateralizing weakness.  There are no sensory deficits.  The skin is warm and dry.  There is no rash.    Assessment / Plan: Continue on same medication.  Recheck in 3 months for office visit .  Blood tests today are pending.

## 2014-02-25 NOTE — Assessment & Plan Note (Signed)
Blood pressure has been remaining stable on current medication.  No dizzy spells.  No syncope.  Patient has not been aware of any racing of his heart or palpitations.  He has a functioning pacemaker for tachybradycardia syndrome

## 2014-02-28 ENCOUNTER — Telehealth: Payer: Self-pay | Admitting: Cardiology

## 2014-02-28 NOTE — Telephone Encounter (Signed)
Message copied by Burnell BlanksPRATT, Cataleia Gade B on Thu Feb 28, 2014  9:05 AM ------      Message from: Cassell ClementBRACKBILL, THOMAS      Created: Mon Feb 25, 2014  7:54 PM       Diabetes is still not well controlled. A1C is 8.8. Continue to titrate the lantus upward.  Kidney function is not as good.  Drink more water.       ------

## 2014-02-28 NOTE — Telephone Encounter (Signed)
Advised son of labs 

## 2014-02-28 NOTE — Telephone Encounter (Signed)
New message  Patient is returning you call, He will be at this number for the next hour. Please call.Benjamin Noble..Benjamin Noble

## 2014-03-06 ENCOUNTER — Ambulatory Visit (INDEPENDENT_AMBULATORY_CARE_PROVIDER_SITE_OTHER): Admitting: *Deleted

## 2014-03-06 DIAGNOSIS — I495 Sick sinus syndrome: Secondary | ICD-10-CM

## 2014-03-06 LAB — MDC_IDC_ENUM_SESS_TYPE_REMOTE
Brady Statistic AP VP Percent: 22.5 %
Brady Statistic AS VP Percent: 0.1 %
Lead Channel Impedance Value: 454 Ohm
Lead Channel Pacing Threshold Amplitude: 0.625 V
Lead Channel Pacing Threshold Pulse Width: 0.4 ms
Lead Channel Pacing Threshold Pulse Width: 0.4 ms
Lead Channel Sensing Intrinsic Amplitude: 1 mV
Lead Channel Sensing Intrinsic Amplitude: 16 mV
Lead Channel Setting Pacing Amplitude: 2.5 V
MDC IDC MSMT BATTERY REMAINING LONGEVITY: 113 mo
MDC IDC MSMT BATTERY VOLTAGE: 2.78 V
MDC IDC MSMT LEADCHNL RV IMPEDANCE VALUE: 557 Ohm
MDC IDC MSMT LEADCHNL RV PACING THRESHOLD AMPLITUDE: 0.75 V
MDC IDC SET LEADCHNL RA PACING AMPLITUDE: 2 V
MDC IDC SET LEADCHNL RV PACING PULSEWIDTH: 0.4 ms
MDC IDC SET LEADCHNL RV SENSING SENSITIVITY: 5.6 mV
MDC IDC STAT BRADY AP VS PERCENT: 43.5 %
MDC IDC STAT BRADY AS VS PERCENT: 33.9 %

## 2014-03-08 ENCOUNTER — Telehealth: Payer: Self-pay | Admitting: Cardiology

## 2014-03-08 NOTE — Telephone Encounter (Signed)
New message     Need us to fax back order for a bed---fax to [phone]. They faxed it to us days ago

## 2014-03-08 NOTE — Telephone Encounter (Signed)
Asked for this to be resent

## 2014-03-13 NOTE — Telephone Encounter (Signed)
Signed and given to Selena BattenKim in medical records to fax back

## 2014-03-18 NOTE — Progress Notes (Signed)
PPM remote 

## 2014-03-20 ENCOUNTER — Encounter: Payer: Self-pay | Admitting: *Deleted

## 2014-03-27 ENCOUNTER — Encounter: Payer: Self-pay | Admitting: Cardiology

## 2014-03-28 ENCOUNTER — Encounter: Payer: Self-pay | Admitting: Internal Medicine

## 2014-05-21 ENCOUNTER — Ambulatory Visit (INDEPENDENT_AMBULATORY_CARE_PROVIDER_SITE_OTHER): Admitting: Internal Medicine

## 2014-05-21 ENCOUNTER — Encounter: Payer: Self-pay | Admitting: Internal Medicine

## 2014-05-21 ENCOUNTER — Other Ambulatory Visit: Payer: Self-pay | Admitting: *Deleted

## 2014-05-21 ENCOUNTER — Encounter: Payer: Self-pay | Admitting: Physician Assistant

## 2014-05-21 ENCOUNTER — Ambulatory Visit (INDEPENDENT_AMBULATORY_CARE_PROVIDER_SITE_OTHER): Admitting: Physician Assistant

## 2014-05-21 ENCOUNTER — Telehealth: Payer: Self-pay | Admitting: Physician Assistant

## 2014-05-21 VITALS — BP 129/63 | HR 62 | Ht 71.0 in

## 2014-05-21 VITALS — BP 129/63 | HR 60 | Ht 71.0 in

## 2014-05-21 DIAGNOSIS — N182 Chronic kidney disease, stage 2 (mild): Secondary | ICD-10-CM

## 2014-05-21 DIAGNOSIS — I255 Ischemic cardiomyopathy: Secondary | ICD-10-CM | POA: Insufficient documentation

## 2014-05-21 DIAGNOSIS — I472 Ventricular tachycardia, unspecified: Secondary | ICD-10-CM

## 2014-05-21 DIAGNOSIS — IMO0001 Reserved for inherently not codable concepts without codable children: Secondary | ICD-10-CM

## 2014-05-21 DIAGNOSIS — Z951 Presence of aortocoronary bypass graft: Secondary | ICD-10-CM

## 2014-05-21 DIAGNOSIS — I4729 Other ventricular tachycardia: Secondary | ICD-10-CM

## 2014-05-21 DIAGNOSIS — E1165 Type 2 diabetes mellitus with hyperglycemia: Secondary | ICD-10-CM

## 2014-05-21 DIAGNOSIS — I259 Chronic ischemic heart disease, unspecified: Secondary | ICD-10-CM

## 2014-05-21 DIAGNOSIS — E7800 Pure hypercholesterolemia, unspecified: Secondary | ICD-10-CM

## 2014-05-21 DIAGNOSIS — I10 Essential (primary) hypertension: Secondary | ICD-10-CM

## 2014-05-21 DIAGNOSIS — R000 Tachycardia, unspecified: Secondary | ICD-10-CM

## 2014-05-21 DIAGNOSIS — Z950 Presence of cardiac pacemaker: Secondary | ICD-10-CM

## 2014-05-21 DIAGNOSIS — I2510 Atherosclerotic heart disease of native coronary artery without angina pectoris: Secondary | ICD-10-CM

## 2014-05-21 DIAGNOSIS — I495 Sick sinus syndrome: Secondary | ICD-10-CM

## 2014-05-21 DIAGNOSIS — I2589 Other forms of chronic ischemic heart disease: Secondary | ICD-10-CM

## 2014-05-21 LAB — MDC_IDC_ENUM_SESS_TYPE_INCLINIC
Battery Impedance: 178 Ohm
Brady Statistic AS VP Percent: 0 %
Brady Statistic AS VS Percent: 33 %
Lead Channel Impedance Value: 448 Ohm
Lead Channel Impedance Value: 546 Ohm
Lead Channel Pacing Threshold Amplitude: 0.75 V
Lead Channel Pacing Threshold Pulse Width: 0.4 ms
Lead Channel Sensing Intrinsic Amplitude: 22.4 mV
Lead Channel Setting Pacing Amplitude: 2 V
Lead Channel Setting Sensing Sensitivity: 5.6 mV
MDC IDC MSMT BATTERY REMAINING LONGEVITY: 113 mo
MDC IDC MSMT BATTERY VOLTAGE: 2.79 V
MDC IDC MSMT LEADCHNL RA PACING THRESHOLD PULSEWIDTH: 0.4 ms
MDC IDC MSMT LEADCHNL RA SENSING INTR AMPL: 1 mV
MDC IDC MSMT LEADCHNL RV PACING THRESHOLD AMPLITUDE: 0.75 V
MDC IDC SESS DTM: 20150602111750
MDC IDC SET LEADCHNL RV PACING AMPLITUDE: 2.5 V
MDC IDC SET LEADCHNL RV PACING PULSEWIDTH: 0.4 ms
MDC IDC STAT BRADY AP VP PERCENT: 28 %
MDC IDC STAT BRADY AP VS PERCENT: 39 %

## 2014-05-21 MED ORDER — INSULIN GLARGINE 100 UNIT/ML ~~LOC~~ SOLN: SUBCUTANEOUS | Status: DC

## 2014-05-21 MED ORDER — INSULIN GLARGINE 100 UNIT/ML ~~LOC~~ SOLN: SUBCUTANEOUS | Status: AC

## 2014-05-21 NOTE — Patient Instructions (Signed)
Your physician wants you to follow-up in: 12 months with Dr Court Joy will receive a reminder letter in the mail two months in advance. If you don't receive a letter, please call our office to schedule the follow-up appointment.   Remote monitoring is used to monitor your Pacemaker or ICD from home. This monitoring reduces the number of office visits required to check your device to one time per year. It allows [place] to keep an eye on the functioning of your device to ensure it is working properly. You are scheduled for a device check from home on 08/22/14. You may send your transmission at any time that day. If you have a wireless device, the transmission will be sent automatically. After your physician reviews your transmission, you will receive a postcard with your next transmission date.

## 2014-05-21 NOTE — Assessment & Plan Note (Signed)
He denies anginal symptoms. Will follow. 

## 2014-05-21 NOTE — Telephone Encounter (Signed)
Please see if Hospice can get BMET, LFTs and TSH drawn. Dx:  Amiodarone, CKD. Thanks Tereso Newcomer, PA-C   05/21/2014 4:56 PM

## 2014-05-21 NOTE — Telephone Encounter (Signed)
Tried to call Hospice tonight but already closed. I will call again tomorrow about having lab work done per Tereso Newcomer, PA for bmet, tsh, lft

## 2014-05-21 NOTE — Telephone Encounter (Signed)
Refilled insulin as requested.

## 2014-05-21 NOTE — Patient Instructions (Signed)
INCREASE LANTUS TO 25 UNITS; DO THIS FOR 3 DAYS AND IF AFTER 3 DAYS YOUR MORNING GLUCOSE LEVEL BEFORE EATING IS ABOVE 150 THEN INCREASE TO 28 UNITS.  Your physician recommends that you schedule a follow-up appointment in: 3 MONTHS WITH DR. Patty Sermons

## 2014-05-21 NOTE — Progress Notes (Signed)
HPI [name] returns today for followup. He is a very pleasant 80 year old man with a history of symptomatic bradycardia, status post permanent pacemaker insertion. He also has an ischemic cardiomyopathy and chronic class II systolic heart failure. Previously, his ejection fraction is been 35-40%. The patient is status post redo bypass over 20 years ago. His only complaint today is that his legs are weak and shaky at times. He has some occaisional spells. He complains of weak legs. Allergies  Allergen Reactions  . Penicillins Anaphylaxis and Swelling  . Lipitor [Atorvastatin Calcium] Other (See Comments)    Gi issues  . Macrodantin Other (See Comments)    Unknown   . Morphine And Related Other (See Comments)    "doesn't feel right"  . Ranolazine Er Other (See Comments)    constipation  . Statins Other (See Comments)    Leg weakness     Current Outpatient Prescriptions  Medication Sig Dispense Refill  . ALPRAZolam (XANAX) 0.25 MG tablet Take 1 tablet (0.25 mg total) by mouth 2 (two) times daily as needed.  180 tablet  1  . amiodarone (PACERONE) 200 MG tablet Take 1/2 tablet (100 mg) daily      . aspirin EC 81 MG tablet Take 1 tablet (81 mg total) by mouth daily. Chest pain  90 tablet  3  . Cyanocobalamin (VITAMIN B 12 PO) Take 1 tablet by mouth every other day.       . docusate sodium (COLACE) 100 MG capsule Take 1 capsule (100 mg total) by mouth daily.  90 capsule  3  . fenofibrate (TRICOR) 48 MG tablet Take 1 tablet (48 mg total) by mouth daily.  90 tablet  3  . ferrous sulfate 325 (65 FE) MG tablet Take 1 tablet (325 mg total) by mouth every other day.  45 tablet  3  . furosemide (LASIX) 20 MG tablet Take 20 mg by mouth daily.       . insulin glargine (LANTUS) 100 UNIT/ML injection 20-23 Units as directed, based on sugar levels.      . insulin NPH-regular Human (NOVOLIN 70/30) (70-30) 100 UNIT/ML injection Inject 25 Units into the skin as directed.      . isosorbide mononitrate  (IMDUR) 30 MG 24 hr tablet Take 1 tablet (30 mg total) by mouth 2 (two) times daily.  180 tablet  3  . levothyroxine (SYNTHROID) 75 MCG tablet Take 1 tablet (75 mcg total) by mouth daily.  90 tablet  3  . losartan (COZAAR) 100 MG tablet Take 100 mg by mouth daily.      . nitroGLYCERIN (NITROSTAT) 0.4 MG SL tablet Place 1 tablet (0.4 mg total) under the tongue every 5 (five) minutes as needed. For chest pain.  100 tablet  3  . pantoprazole (PROTONIX) 40 MG tablet Take 1 tablet (40 mg total) by mouth daily.  90 tablet  3   No current facility-administered medications for this visit.     Past Medical History  Diagnosis Date  . Ischemic heart disease     original CABG in 1984 with redo CABG in 1992; managed medically  . HTN (hypertension)   . Angina pectoris   . Hypercholesterolemia   . CHF (congestive heart failure)     EF is 35 to 40% per echo April 2013  . Arthritis   . Urinary incontinence   . Myocardial infarction   . Dysrhythmia     irregular  heart beats per patient  . Asthma   .  Shortness of breath   . Diabetes mellitus     type 2  . GERD (gastroesophageal reflux disease)   . Neuromuscular disorder     periferal neruropathy  . Depression   . Syncope   . Subarachnoid hemorrhage April 2013    from fall/coumadin & aspirin stopped  . Coronary artery disease   . Pacemaker   . Chronic kidney disease     hx of acute renal failure    ROS:   All systems reviewed and negative except as noted in the HPI.   Past Surgical History  Procedure Laterality Date  . Cardiac catheterization  03/18/1998  . Coronary artery bypass graft  9326,7124    first at [facility]  . Circumcision  02/05/08  . Eye surgery    . Cardiac stents    . Insert / replace / remove pacemaker  05/2012     Family History  Problem Relation Age of Onset  . Heart attack Father      History   Social History  . Marital Status: Married    Spouse Name: N/A    Number of Children: N/A  . Years of  Education: N/A   Occupational History  . Not on file.   Social History Main Topics  . Smoking status: Never Smoker   . Smokeless tobacco: Never Used  . Alcohol Use: No  . Drug Use: No  . Sexual Activity: Not Currently   Other Topics Concern  . Not on file   Social History Narrative   The patient lives at home with his wife, who has alzheimer's.  They have a 24-hr sitter in the home, and close family contacts nearby.  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     BP 129/63  Pulse 60  Ht 5\' 11"  (1.803 m)  Physical Exam:  Chronically ill appearing 80 year old man, NAD HEENT: Unremarkable Neck:  No JVD, no thyromegally Lungs:  Clear with no wheezes, rales, or rhonchi. HEART:  Regular rate rhythm, no murmurs, no rubs, no clicks Abd:  soft, positive bowel sounds, no organomegally, no rebound, no guarding Ext:  2 plus pulses, no edema, no cyanosis, no clubbing Skin:  No rashes no nodules Neuro:  CN II through XII intact, motor grossly intact  DEVICE  Normal device function.  See PaceArt for details.   Assess/Plan:

## 2014-05-21 NOTE — Assessment & Plan Note (Signed)
His medtronic DDD PM is working normally. Will recheck in several months. 

## 2014-05-21 NOTE — Progress Notes (Signed)
Cardiology Office Note   Date:  05/21/2014   ID:  Benjamin, Noble [birth year], MRN [id number]  PCP:  Cassell Clement, MD  Cardiologist:  Dr. Cassell Clement   Electrophysiologist:  Dr. [name]     History of Present Illness: Benjamin Noble is a 80 y.o. male with a hx of CAD s/p redo CABG in 1992, ischemic cardiomyopathy, HTN, GERD, DM, chronic angina, hypothyroidism.    He is s/p pacemaker for symptomatic bradycardia.  He is on Amiodarone for a hx of WCT.  Last seen by Dr. Cassell Clement in 02/2014.  He returns for follow up with his son.  He is followed by Hospice.  He has had more spells of unresponsiveness.  He apparently has improvement with SL NTG.  His neuropathy is quite severe and he cannot feel chest pain.  He notes significant DOE (NYHA Class 3b).  He denies orthopnea, PND, edema.  No syncope.  He has been seen by the Hospice physician and it is felt that he is having TIAs.  He is being managed conservatively.  His son would like to know how he should dose his insulin.  He does not have a primary care doctor.    Studies:  - Echo (04/27/12):  EF 25% to 30%. The posterior wall and apex were akinetic. The inferior and anterolateral walls were severely hypokinetic. Doppler parameters are consistent with restrictive physiology, Aortic valve: Sclerosis without stenosis. Left atrium: The atrium was moderately dilated. RV Systolic function was mildly reduced. PASP 45mm Hg   - Nuclear (07/2006):  Bilateral 50-69%   Recent Labs: 11/26/2013: ALT 8; HDL Cholesterol by NMR 34.10*; LDL (calc) 110*  12/07/2013: Hemoglobin 14.8  02/25/2014: Creatinine 2.3*; Potassium 4.5   Wt Readings from Last 3 Encounters:  02/25/14 186 lb (84.369 kg)  11/26/13 180 lb 12.8 oz (82.01 kg)  08/27/13 178 lb (80.74 kg)     Past Medical History  Diagnosis Date  . Ischemic heart disease     original CABG in 1984 with redo CABG in 1992; managed medically  . HTN (hypertension)   . Angina  pectoris   . Hypercholesterolemia   . CHF (congestive heart failure)     EF is 35 to 40% per echo April 2013  . Arthritis   . Urinary incontinence   . Myocardial infarction   . Dysrhythmia     irregular  heart beats per patient  . Asthma   . Shortness of breath   . Diabetes mellitus     type 2  . GERD (gastroesophageal reflux disease)   . Neuromuscular disorder     periferal neruropathy  . Depression   . Syncope   . Subarachnoid hemorrhage April 2013    from fall/coumadin & aspirin stopped  . Coronary artery disease   . Pacemaker   . Chronic kidney disease     hx of acute renal failure    Current Outpatient Prescriptions  Medication Sig Dispense Refill  . ALPRAZolam (XANAX) 0.25 MG tablet Take 1 tablet (0.25 mg total) by mouth 2 (two) times daily as needed.  180 tablet  1  . amiodarone (PACERONE) 200 MG tablet Take 1/2 tablet (100 mg) daily      . aspirin EC 81 MG tablet Take 1 tablet (81 mg total) by mouth daily. Chest pain  90 tablet  3  . Cyanocobalamin (VITAMIN B 12 PO) Take 1 tablet by mouth every other day.       . docusate sodium (COLACE)  100 MG capsule Take 1 capsule (100 mg total) by mouth daily.  90 capsule  3  . fenofibrate (TRICOR) 48 MG tablet Take 1 tablet (48 mg total) by mouth daily.  90 tablet  3  . ferrous sulfate 325 (65 FE) MG tablet Take 1 tablet (325 mg total) by mouth every other day.  45 tablet  3  . furosemide (LASIX) 20 MG tablet Take 20 mg by mouth daily.       . insulin NPH-regular Human (NOVOLIN 70/30) (70-30) 100 UNIT/ML injection Inject 25 Units into the skin as directed.      . isosorbide mononitrate (IMDUR) 30 MG 24 hr tablet Take 1 tablet (30 mg total) by mouth 2 (two) times daily.  180 tablet  3  . levothyroxine (SYNTHROID) 75 MCG tablet Take 1 tablet (75 mcg total) by mouth daily.  90 tablet  3  . losartan (COZAAR) 100 MG tablet Take 100 mg by mouth daily.      . nitroGLYCERIN (NITROSTAT) 0.4 MG SL tablet Place 1 tablet (0.4 mg total) under  the tongue every 5 (five) minutes as needed. For chest pain.  100 tablet  3  . pantoprazole (PROTONIX) 40 MG tablet Take 1 tablet (40 mg total) by mouth daily.  90 tablet  3  . insulin glargine (LANTUS) 100 UNIT/ML injection 10-25 Units as directed, based on sugar levels.  10 mL  PRN   No current facility-administered medications for this visit.    Allergies:   Penicillins; Lipitor; Macrodantin; Morphine and related; Ranolazine er; and Statins   Social History:  The patient  reports that he has never smoked. He has never used smokeless tobacco. He reports that he does not drink alcohol or use illicit drugs.   Family History:  The patient's family history includes Heart attack in his father.   ROS:  Please see the history of present illness.      All other systems reviewed and negative.   PHYSICAL EXAM: VS:  BP 129/63  Pulse 62  Ht 5\' 11"  (1.803 m) Well nourished, well developed, in no acute distress HEENT: normal Neck: no JVD Cardiac:  normal S1, S2; RRR; no murmur Lungs:  clear to auscultation bilaterally, no wheezing, rhonchi or rales Abd: soft, nontender, no hepatomegaly Ext: no edema Skin: warm and dry Neuro:  CNs 2-12 intact, no focal abnormalities noted  EKG:  AV paced, HR 62     ASSESSMENT AND PLAN:  1. CAD (coronary artery disease):  He has severe CAD and is s/p redo CABG some 20+ years ago.  He has chronic angina and is on Hospice. He is being managed conservatively.  Continue ASA, nitrates.   2. Ischemic cardiomyopathy:  He is being managed conservatively.  He is on Hospice.  He is not a candidate for ICD. 3. Hypertension:  Controlled.  4. Type II Diabetes Mellitus:  He is not followed by primary care and is requesting help with managing diabetes.  I have asked him to increase his Lantus to 25 units QHS.  If his AM fasting sugar remains > 150, he can increase this to 28 units QHS. 5. Hypercholesterolemia:  He is intol to statins.  6. CKD (chronic kidney disease), stage  II           7. Pacemaker-Medtronic :  F/u with EP as planned. 8. Wide Complex Tachycardia:  He remains on Amiodarone.  After patient left the office, it was noted that LFTs and TSH has not been checked  in quite some time.  Will see if Hospice can draw LFTs and TSH.  9. Disposition:  F/u with Dr. Cassell Clementhomas Brackbill in 3 mos.    Signed, Brynda RimScott Charla Criscione, PA-C, MHS 05/21/2014 12:08 PM    [facility] [address], [place], [place]  [address] Phone: [phone]; Fax: [phone]

## 2014-05-21 NOTE — Assessment & Plan Note (Signed)
His DM has been poorly controlled at times. He will continue his novolog.

## 2014-05-22 ENCOUNTER — Telehealth: Payer: Self-pay | Admitting: *Deleted

## 2014-05-22 NOTE — Telephone Encounter (Signed)
Nelva Bush, RN w/Hospice rtnd my call. Gave v/o for labs, bmet, lft, tsh. Nelva Bush said she will get tomorrow when she see's pt. I said that wiil be fine and let advise Scott W. PA.

## 2014-05-22 NOTE — Telephone Encounter (Signed)
OPENED ANOTHER PHONE NOTE IN ERROR

## 2014-05-22 NOTE — Telephone Encounter (Signed)
Lmtcb for Nelva Bush, RN with Hospice for pt. Benjamin Noble. PA  saw pt yesterday. At the end of the day the PA said he wanted to see if Hospice could get some lab work, BMET, LFT, TSH. I will fax order over once Nelva Bush, RN cb to verify info.

## 2014-05-22 NOTE — Telephone Encounter (Signed)
Norma, RN w/Hospice rtnd my call. Gave v/o for labs, bmet, lft, tsh. Norma said she will get tomorrow when she see's pt. I said that wiil be fine and let advise Scott W. PA. 

## 2014-05-23 ENCOUNTER — Encounter: Payer: Self-pay | Admitting: Cardiology

## 2014-05-23 ENCOUNTER — Telehealth: Payer: Self-pay | Admitting: Cardiology

## 2014-05-23 NOTE — Telephone Encounter (Signed)
Pt's Son Called asking if #7 Of FMLA Could be Changed, They are having to around the Clock care for his Father. Took to [place]  6.4.15/km

## 2014-05-29 ENCOUNTER — Encounter: Payer: Self-pay | Admitting: Internal Medicine

## 2014-05-31 ENCOUNTER — Telehealth: Payer: Self-pay | Admitting: Cardiology

## 2014-05-31 NOTE — Telephone Encounter (Signed)
If BS spikes above 300 can give Novolog 5 units [employer].  Conntinue Lantus.

## 2014-05-31 NOTE — Telephone Encounter (Signed)
Advised daughter-in-law. She will call back next week with a fax number to fax both insulin Rx's to [facility]. Aware will not be faxed until 6/22

## 2014-05-31 NOTE — Telephone Encounter (Signed)
Spoke with daughter and patients am blood sugar has been has been below 150 and he is only on 25 units of Lantus. Last week his blood sugar spiked to over 400 and daughter gave him Novolog to bring down. The next day he had a heart attack (per daughter, Hospice MD saw him). Daughter is wanting to know about having a fast acting insulin have on hand if blood sugar spikes. Will forward to  Dr. Patty SermonsBrackbill for review

## 2014-05-31 NOTE — Telephone Encounter (Signed)
New message      Son of the patient said he was to call Juliette AlcideMelinda this morning

## 2014-06-03 ENCOUNTER — Ambulatory Visit: Admitting: Cardiology

## 2014-06-04 ENCOUNTER — Ambulatory Visit: Admitting: Cardiology

## 2014-06-04 ENCOUNTER — Telehealth: Payer: Self-pay | Admitting: *Deleted

## 2014-06-04 NOTE — Telephone Encounter (Signed)
S/w Shelly @ [phone] from the [facility] is aware of pt's Lantus dose requested a fax copy of script will send over this am at [phone]

## 2014-06-04 NOTE — Telephone Encounter (Signed)
lmovm to Benjamin Noble at the [facility] @ [phone] to discuss pt's insulin

## 2014-06-19 NOTE — Telephone Encounter (Signed)
Will fax as requested   Mr. Idolina PrimerUnderwood called asking For script be Sent to Dr.Dyers  @ [phone] For His Father

## 2014-08-22 ENCOUNTER — Ambulatory Visit (INDEPENDENT_AMBULATORY_CARE_PROVIDER_SITE_OTHER): Admitting: *Deleted

## 2014-08-22 DIAGNOSIS — I495 Sick sinus syndrome: Secondary | ICD-10-CM

## 2014-08-22 LAB — MDC_IDC_ENUM_SESS_TYPE_REMOTE
Battery Impedance: 203 Ohm
Brady Statistic AP VP Percent: 36 %
Brady Statistic AP VS Percent: 31 %
Brady Statistic AS VP Percent: 0 %
Brady Statistic AS VS Percent: 32 %
Date Time Interrogation Session: 20150903112025
Lead Channel Impedance Value: 448 Ohm
Lead Channel Impedance Value: 555 Ohm
Lead Channel Pacing Threshold Amplitude: 0.625 V
Lead Channel Pacing Threshold Pulse Width: 0.4 ms
Lead Channel Sensing Intrinsic Amplitude: 1 mV
MDC IDC MSMT BATTERY REMAINING LONGEVITY: 107 mo
MDC IDC MSMT BATTERY VOLTAGE: 2.79 V
MDC IDC MSMT LEADCHNL RV PACING THRESHOLD AMPLITUDE: 0.625 V
MDC IDC MSMT LEADCHNL RV PACING THRESHOLD PULSEWIDTH: 0.4 ms
MDC IDC MSMT LEADCHNL RV SENSING INTR AMPL: 16 mV
MDC IDC SET LEADCHNL RA PACING AMPLITUDE: 2 V
MDC IDC SET LEADCHNL RV PACING AMPLITUDE: 2.5 V
MDC IDC SET LEADCHNL RV PACING PULSEWIDTH: 0.46 ms
MDC IDC SET LEADCHNL RV SENSING SENSITIVITY: 5.6 mV

## 2014-08-22 NOTE — Progress Notes (Signed)
Remote pacemaker transmission.   

## 2014-09-02 ENCOUNTER — Encounter: Payer: Self-pay | Admitting: Cardiology

## 2014-09-02 ENCOUNTER — Ambulatory Visit (INDEPENDENT_AMBULATORY_CARE_PROVIDER_SITE_OTHER): Admitting: Cardiology

## 2014-09-02 VITALS — BP 129/63 | HR 62 | Ht 71.0 in | Wt 186.0 lb

## 2014-09-02 DIAGNOSIS — I119 Hypertensive heart disease without heart failure: Secondary | ICD-10-CM

## 2014-09-02 DIAGNOSIS — I472 Ventricular tachycardia: Secondary | ICD-10-CM

## 2014-09-02 DIAGNOSIS — E1165 Type 2 diabetes mellitus with hyperglycemia: Secondary | ICD-10-CM

## 2014-09-02 DIAGNOSIS — I2589 Other forms of chronic ischemic heart disease: Secondary | ICD-10-CM

## 2014-09-02 DIAGNOSIS — I259 Chronic ischemic heart disease, unspecified: Secondary | ICD-10-CM

## 2014-09-02 DIAGNOSIS — Z79899 Other long term (current) drug therapy: Secondary | ICD-10-CM

## 2014-09-02 DIAGNOSIS — I509 Heart failure, unspecified: Secondary | ICD-10-CM

## 2014-09-02 DIAGNOSIS — I5023 Acute on chronic systolic (congestive) heart failure: Secondary | ICD-10-CM

## 2014-09-02 DIAGNOSIS — R000 Tachycardia, unspecified: Secondary | ICD-10-CM

## 2014-09-02 DIAGNOSIS — I255 Ischemic cardiomyopathy: Secondary | ICD-10-CM

## 2014-09-02 DIAGNOSIS — I4729 Other ventricular tachycardia: Secondary | ICD-10-CM

## 2014-09-02 DIAGNOSIS — IMO0001 Reserved for inherently not codable concepts without codable children: Secondary | ICD-10-CM

## 2014-09-02 DIAGNOSIS — Z951 Presence of aortocoronary bypass graft: Secondary | ICD-10-CM

## 2014-09-02 LAB — HEMOGLOBIN A1C: Hgb A1c MFr Bld: 10 % — ABNORMAL HIGH (ref 4.6–6.5)

## 2014-09-02 LAB — HEPATIC FUNCTION PANEL
ALK PHOS: 60 U/L (ref 39–117)
ALT: 8 U/L (ref 0–53)
AST: 16 U/L (ref 0–37)
Albumin: 3.5 g/dL (ref 3.5–5.2)
BILIRUBIN TOTAL: 0.6 mg/dL (ref 0.2–1.2)
Bilirubin, Direct: 0 mg/dL (ref 0.0–0.3)
Total Protein: 7.2 g/dL (ref 6.0–8.3)

## 2014-09-02 LAB — BASIC METABOLIC PANEL
BUN: 34 mg/dL — ABNORMAL HIGH (ref 6–23)
CO2: 26 meq/L (ref 19–32)
CREATININE: 2.1 mg/dL — AB (ref 0.4–1.5)
Calcium: 8.6 mg/dL (ref 8.4–10.5)
Chloride: 100 mEq/L (ref 96–112)
GFR: 31.87 mL/min — ABNORMAL LOW (ref >60.00)
GLUCOSE: 241 mg/dL — AB (ref 70–99)
Potassium: 4.5 mEq/L (ref 3.5–5.1)
Sodium: 134 mEq/L — ABNORMAL LOW (ref 135–145)

## 2014-09-02 LAB — CBC WITH DIFFERENTIAL/PLATELET
BASOS ABS: 0.1 10*3/uL (ref 0.0–0.1)
BASOS PCT: 0.8 % (ref 0.0–3.0)
EOS ABS: 0.3 10*3/uL (ref 0.0–0.7)
Eosinophils Relative: 3.6 % (ref 0.0–5.0)
HCT: 41.2 % (ref 39.0–52.0)
Hemoglobin: 13.6 g/dL (ref 13.0–17.0)
Lymphocytes Relative: 17.9 % (ref 12.0–46.0)
Lymphs Abs: 1.4 10*3/uL (ref 0.7–4.0)
MCHC: 33 g/dL (ref 30.0–36.0)
MCV: 92.1 fl (ref 78.0–100.0)
MONO ABS: 0.7 10*3/uL (ref 0.1–1.0)
Monocytes Relative: 8.9 % (ref 3.0–12.0)
NEUTROS PCT: 68.8 % (ref 43.0–77.0)
Neutro Abs: 5.2 10*3/uL (ref 1.4–7.7)
Platelets: 280 10*3/uL (ref 150.0–400.0)
RBC: 4.47 Mil/uL (ref 4.22–5.81)
RDW: 13.7 % (ref 11.5–15.5)
WBC: 7.6 10*3/uL (ref 4.0–10.5)

## 2014-09-02 LAB — TSH: TSH: 2.02 u[IU]/mL (ref 0.35–4.50)

## 2014-09-02 NOTE — Assessment & Plan Note (Signed)
The patient has not had any exacerbation of his chronic CHF.  He does not have any peripheral edema at this time.  His pedal pulses are equivocal.

## 2014-09-02 NOTE — Assessment & Plan Note (Signed)
The patient has painful diabetic neuropathy.  We are checking A1c today.

## 2014-09-02 NOTE — Progress Notes (Signed)
Benjamin Noble Date of Birth:  1922-07-23 [facility] [address] [place], [place]  [address] [phone]        Fax   [phone]   History of Present Illness: This pleasant 80 year old gentleman is seen for a scheduled 3 month followup office visit.  He is with hospice. The patient has a history of ischemic heart disease. He has had coronary artery bypass graft surgery twice, most recently in 1992 which was a redo. He has chronic angina that is managed medically. He has LV dysfunction, hypertension, diabetes mellitus, and GERD. He was recently found to have prolonged pauses on Holter monitoring and subsequently underwent a pacemaker implantation successfully.  He has a history of wide-complex tachycardia and is on amiodarone. The patient has a history of hypothyroidism and is on thyroid replacement. Patient has significant osteoarthritis. He has had worsening arthritis of the right hip.  He has diabetic neuropathy. He also has significant arthritis of his back and we recently ordered him a back brace. He remains under hospice care.  Hospice has recommended round-the-clock care 24/7.  Her the patient refuses to go to a nursing home.  His family does a very good job of caring for him and he does have a life alert monitor on his wrist that he can push if he were to have a fall when no one was there.   Current Outpatient Prescriptions  Medication Sig Dispense Refill  . ALPRAZolam (XANAX) 0.25 MG tablet Take 1 tablet (0.25 mg total) by mouth 2 (two) times daily as needed.  180 tablet  1  . amiodarone (PACERONE) 200 MG tablet Take 1/2 tablet (100 mg) daily      . aspirin EC 81 MG tablet Take 1 tablet (81 mg total) by mouth daily. Chest pain  90 tablet  3  . Cyanocobalamin (VITAMIN B 12 PO) Take 1 tablet by mouth every other day.       . docusate sodium (COLACE) 100 MG capsule Take 1 capsule (100 mg total) by mouth daily.  90 capsule  3  . fenofibrate (TRICOR) 48 MG  tablet Take 1 tablet (48 mg total) by mouth daily.  90 tablet  3  . ferrous sulfate 325 (65 FE) MG tablet Take 1 tablet (325 mg total) by mouth every other day.  45 tablet  3  . furosemide (LASIX) 20 MG tablet Take 20 mg by mouth daily.       . insulin aspart (NOVOLOG) 100 UNIT/ML injection Inject into the skin as directed. 5 units SQ if blood sugar above 300      . insulin glargine (LANTUS) 100 UNIT/ML injection 05/21/14 Increase to 25 units; if AM sugar level after 3 days is above 150 then increase to 28 units  10 mL  PRN  . isosorbide mononitrate (IMDUR) 30 MG 24 hr tablet Take 1 tablet (30 mg total) by mouth 2 (two) times daily.  180 tablet  3  . levothyroxine (SYNTHROID) 75 MCG tablet Take 1 tablet (75 mcg total) by mouth daily.  90 tablet  3  . losartan (COZAAR) 100 MG tablet Take 100 mg by mouth daily.      . nitroGLYCERIN (NITROSTAT) 0.4 MG SL tablet Place 1 tablet (0.4 mg total) under the tongue every 5 (five) minutes as needed. For chest pain.  100 tablet  3  . pantoprazole (PROTONIX) 40 MG tablet Take 1 tablet (40 mg total) by mouth daily.  90 tablet  3  No current facility-administered medications for this visit.    Allergies  Allergen Reactions  . Penicillins Anaphylaxis and Swelling  . Lipitor [Atorvastatin Calcium] Other (See Comments)    Gi issues  . Macrodantin Other (See Comments)    Unknown   . Morphine And Related Other (See Comments)    "doesn't feel right"  . Ranolazine Er Other (See Comments)    constipation  . Statins Other (See Comments)    Leg weakness    Patient Active Problem List   Diagnosis Date Noted  . Angina pectoris 03/15/2011    Priority: High  . Hypertension     Priority: High  . Type II Diabetes Mellitus  03/15/2011    Priority: Medium  . Hypercholesterolemia 03/15/2011    Priority: Medium  . Ischemic cardiomyopathy 05/21/2014  . Osteoarthritis 08/27/2013  . Acute on chronic systolic CHF (congestive heart failure) 07/19/2012  .  Hypothyroid 07/19/2012  . Constipation 07/19/2012  . Hypotension 07/18/2012  . Pacemaker-Medtronic 04/28/2012  . Acute myocardial infarction, subendocardial infarction, initial episode of care 04/27/2012  . Wide-complex tachycardia 04/26/2012  . Tachy-brady syndrome 04/26/2012  . Syncope 04/05/2012  . Subarachnoid hemorrhage 04/05/2012  . CKD (chronic kidney disease), stage II 04/05/2012  . CAD (coronary artery disease) 04/05/2012  . Bundle branch block 04/05/2012  . S/P CABG (coronary artery bypass graft) 03/15/2011  . Anxiety associated with depression 03/15/2011    History  Smoking status  . Never Smoker   Smokeless tobacco  . Never Used    History  Alcohol Use No    Family History  Problem Relation Age of Onset  . Heart attack Father     Review of Systems: Constitutional: no fever chills diaphoresis or fatigue or change in weight.  Head and neck: no hearing loss, no epistaxis, no photophobia or visual disturbance. Respiratory: No cough, shortness of breath or wheezing. Cardiovascular: No chest pain peripheral edema, palpitations. Gastrointestinal: No abdominal distention, no abdominal pain, no change in bowel habits hematochezia or melena. Genitourinary: No dysuria, no frequency, no urgency, no nocturia. Musculoskeletal:No arthralgias, no back pain, no gait disturbance or myalgias. Neurological: No dizziness, no headaches, no numbness, no seizures, no syncope, no weakness, no tremors. Hematologic: No lymphadenopathy, no easy bruising. Psychiatric: No confusion, no hallucinations, no sleep disturbance.    Physical Exam: Filed Vitals:   09/02/14 1130  BP: 129/63  Pulse: 62  The patient appears to be in no distress.  He is mentally alert.  He is able to make his own healthcare decisions.  Head and neck exam reveals that the pupils are equal and reactive.  The extraocular movements are full.  There is no scleral icterus.  Mouth and pharynx are benign.  No  lymphadenopathy.  No carotid bruits.  The jugular venous pressure is normal.  Thyroid is not enlarged or tender.  Chest is clear to percussion and auscultation.  No rales or rhonchi.  Expansion of the chest is symmetrical.  Heart reveals no abnormal lift or heave.  First and second heart sounds are normal.  There is no murmur gallop rub or click.  The abdomen is soft and nontender.  Bowel sounds are normoactive.  There is no hepatosplenomegaly or mass.  There are no abdominal bruits.  Extremities reveal no phlebitis or edema.  Pedal pulses are equivocal. There is no cyanosis or clubbing.  Neurologic exam is decreased strength and no lateralizing weakness.  No sensory deficits.  He is weak and is in a wheelchair.  Integument reveals  no rash    Assessment / Plan: 1.  Ischemic heart disease with previous CABG and redo CABG.  Not a candidate for further ischemic workup.  Chronic angina on hospice. 2. ischemic cardiomyopathy.  Not a candidate for ICD 3. tachybradycardia syndrome with Medtronic pacemaker 4. past history of wide-complex tachycardia on low-dose amiodarone.  Blood work today pending. 5. chronic kidney disease stage II 6. diabetes mellitus on insulin.  Not careful with his diet .   A1c pending. 7. hypertensive cardiovascular disease 8. hypercholesterolemia intolerant of statins  Plan: Continue current medication.  Laboratory pending.  Recheck in 3 months for followup office visit.

## 2014-09-02 NOTE — Progress Notes (Signed)
Quick Note:  Please report to patient. The recent labs are stable. Continue same medication and careful diet. A1C 10.0 too high. Needs to do better with avoiding sweets and carbs ______

## 2014-09-02 NOTE — Patient Instructions (Signed)
Will obtain labs today and call you with the results (BMET/HFP/TSH/A1C/CBC)  Your physician recommends that you continue on your current medications as directed. Please refer to the Current Medication list given to you today.  Your physician recommends that you schedule a follow-up appointment in: 3 MONTH OV

## 2014-09-02 NOTE — Assessment & Plan Note (Signed)
He remains on low dose chronic amiodarone.  We are checking liver function tests and thyroid tests today

## 2014-09-10 ENCOUNTER — Encounter: Payer: Self-pay | Admitting: Cardiology

## 2014-09-12 ENCOUNTER — Encounter: Payer: Self-pay | Admitting: Internal Medicine

## 2014-11-03 ENCOUNTER — Emergency Department (HOSPITAL_COMMUNITY)

## 2014-11-03 ENCOUNTER — Telehealth: Payer: Self-pay | Admitting: Surgery

## 2014-11-03 ENCOUNTER — Encounter (HOSPITAL_COMMUNITY): Payer: Self-pay | Admitting: Family Medicine

## 2014-11-03 ENCOUNTER — Emergency Department (HOSPITAL_COMMUNITY)
Admission: EM | Admit: 2014-11-03 | Discharge: 2014-11-03 | Disposition: A | Discharge status: Home / Self Care | Attending: Emergency Medicine | Admitting: Emergency Medicine

## 2014-11-03 DIAGNOSIS — E119 Type 2 diabetes mellitus without complications: Secondary | ICD-10-CM | POA: Insufficient documentation

## 2014-11-03 DIAGNOSIS — I129 Hypertensive chronic kidney disease with stage 1 through stage 4 chronic kidney disease, or unspecified chronic kidney disease: Secondary | ICD-10-CM | POA: Insufficient documentation

## 2014-11-03 DIAGNOSIS — I2510 Atherosclerotic heart disease of native coronary artery without angina pectoris: Secondary | ICD-10-CM | POA: Insufficient documentation

## 2014-11-03 DIAGNOSIS — I252 Old myocardial infarction: Secondary | ICD-10-CM | POA: Insufficient documentation

## 2014-11-03 DIAGNOSIS — Z79899 Other long term (current) drug therapy: Secondary | ICD-10-CM | POA: Diagnosis not present

## 2014-11-03 DIAGNOSIS — Z950 Presence of cardiac pacemaker: Secondary | ICD-10-CM | POA: Insufficient documentation

## 2014-11-03 DIAGNOSIS — Z951 Presence of aortocoronary bypass graft: Secondary | ICD-10-CM | POA: Insufficient documentation

## 2014-11-03 DIAGNOSIS — N189 Chronic kidney disease, unspecified: Secondary | ICD-10-CM | POA: Insufficient documentation

## 2014-11-03 DIAGNOSIS — Z794 Long term (current) use of insulin: Secondary | ICD-10-CM | POA: Diagnosis not present

## 2014-11-03 DIAGNOSIS — I249 Acute ischemic heart disease, unspecified: Secondary | ICD-10-CM | POA: Insufficient documentation

## 2014-11-03 DIAGNOSIS — R112 Nausea with vomiting, unspecified: Secondary | ICD-10-CM | POA: Insufficient documentation

## 2014-11-03 DIAGNOSIS — M79605 Pain in left leg: Secondary | ICD-10-CM | POA: Insufficient documentation

## 2014-11-03 DIAGNOSIS — K219 Gastro-esophageal reflux disease without esophagitis: Secondary | ICD-10-CM | POA: Diagnosis not present

## 2014-11-03 DIAGNOSIS — Z9889 Other specified postprocedural states: Secondary | ICD-10-CM | POA: Diagnosis not present

## 2014-11-03 DIAGNOSIS — I509 Heart failure, unspecified: Secondary | ICD-10-CM | POA: Diagnosis not present

## 2014-11-03 DIAGNOSIS — Z7982 Long term (current) use of aspirin: Secondary | ICD-10-CM | POA: Insufficient documentation

## 2014-11-03 DIAGNOSIS — Z880 Allergy status to penicillin: Secondary | ICD-10-CM | POA: Diagnosis not present

## 2014-11-03 DIAGNOSIS — M79609 Pain in unspecified limb: Secondary | ICD-10-CM

## 2014-11-03 DIAGNOSIS — F329 Major depressive disorder, single episode, unspecified: Secondary | ICD-10-CM | POA: Insufficient documentation

## 2014-11-03 LAB — COMPREHENSIVE METABOLIC PANEL
ALK PHOS: 69 U/L (ref 39–117)
ALT: 8 U/L (ref 0–53)
AST: 14 U/L (ref 0–37)
Albumin: 3.3 g/dL — ABNORMAL LOW (ref 3.5–5.2)
Anion gap: 14 (ref 5–15)
BUN: 43 mg/dL — ABNORMAL HIGH (ref 6–23)
CALCIUM: 8.6 mg/dL (ref 8.4–10.5)
CO2: 27 meq/L (ref 19–32)
Chloride: 98 mEq/L (ref 96–112)
Creatinine, Ser: 2.86 mg/dL — ABNORMAL HIGH (ref 0.50–1.35)
GFR, EST AFRICAN AMERICAN: 21 mL/min — AB (ref >90)
GFR, EST NON AFRICAN AMERICAN: 18 mL/min — AB (ref >90)
GLUCOSE: 87 mg/dL (ref 70–99)
Potassium: 5.1 mEq/L (ref 3.7–5.3)
SODIUM: 139 meq/L (ref 137–147)
Total Bilirubin: 0.4 mg/dL (ref 0.3–1.2)
Total Protein: 7 g/dL (ref 6.0–8.3)

## 2014-11-03 LAB — CBC WITH DIFFERENTIAL/PLATELET
BASOS PCT: 0 % (ref 0–1)
Basophils Absolute: 0 10*3/uL (ref 0.0–0.1)
EOS ABS: 0.2 10*3/uL (ref 0.0–0.7)
EOS PCT: 2 % (ref 0–5)
HCT: 39.7 % (ref 39.0–52.0)
Hemoglobin: 12.9 g/dL — ABNORMAL LOW (ref 13.0–17.0)
Lymphocytes Relative: 7 % — ABNORMAL LOW (ref 12–46)
Lymphs Abs: 0.7 10*3/uL (ref 0.7–4.0)
MCH: 30.7 pg (ref 26.0–34.0)
MCHC: 32.5 g/dL (ref 30.0–36.0)
MCV: 94.5 fL (ref 78.0–100.0)
Monocytes Absolute: 0.8 10*3/uL (ref 0.1–1.0)
Monocytes Relative: 7 % (ref 3–12)
Neutro Abs: 9.5 10*3/uL — ABNORMAL HIGH (ref 1.7–7.7)
Neutrophils Relative %: 84 % — ABNORMAL HIGH (ref 43–77)
Platelets: 234 10*3/uL (ref 150–400)
RBC: 4.2 MIL/uL — ABNORMAL LOW (ref 4.22–5.81)
RDW: 13.4 % (ref 11.5–15.5)
WBC: 11.3 10*3/uL — ABNORMAL HIGH (ref 4.0–10.5)

## 2014-11-03 LAB — TROPONIN I: Troponin I: <0.3 ng/mL (ref <0.30)

## 2014-11-03 NOTE — ED Notes (Signed)
Patient hasn't void.

## 2014-11-03 NOTE — Progress Notes (Signed)
Left lower extremity venous duplex completed.  Left:  No evidence of DVT, superficial thrombosis, or Baker's cyst.  Right:  Negative for DVT in the common femoral vein.  

## 2014-11-03 NOTE — Discharge Instructions (Signed)
Follow-up with your family doctor and hospice. Continue your medications as prescribed.

## 2014-11-03 NOTE — Progress Notes (Signed)
On call visit to check on patient. Patient is laying in bed alert and oriented to self and situation. Nurse and CNA currently doing an in and out cath for clean catch urine. Family requested blood work drawn. Patient states he is feeling good at this time, vital signs seem stable. Spoke with family and they stated that they can not take him back home if he can not get out of the bed. Asked family if they had an alternate plan or if patient had medicaid and they have neither. Patient seems stable at this time awaiting lab work. Genia Delalled Wanda with Case Manager for the hospital and alerted her to the familys plan.

## 2014-11-03 NOTE — ED Provider Notes (Signed)
CSN: [id number]     Arrival date & time 11/03/14  [id number] History   First MD Initiated Contact with Patient 11/03/14 [phone]     Chief Complaint  Patient presents with  . Emesis  . Leg Pain      HPI Comments: Benjamin Noble presents for evaluation of vomiting and leg pain. In terms of the leg pain, it is described as in the left leg and started yesterday. Pain is from the knee down to the foot and very intense in nature, sharp and burning. Pain is completely resolved today. There are no clear alleviating or worsening factors, but the patient did have difficulty with ambulation. Benjamin Noble also had vomiting that started yesterday afternoon. He had some dizziness and vomiting when he tried to get up to use the bathroom yesterday the symptoms resolved. This morning he developed recurrent dizziness and vomiting with multiple episodes of emesis. On emergency department evaluation patient reports feeling improved: nausea, dizziness, and leg pain are completely resolved at this time. There are no reports of fevers. Patient denies dysuria, leg swelling, chest pain. Patient is chronically incontinent of urine. Patient is on home hospice due to cardiac disease. Patient's anxiety and pain meds were increased yesterday due to the pain.  Patient is a 80 y.o. male presenting with vomiting and leg pain. The history is provided by the patient (son).  Emesis Leg Pain   Past Medical History  Diagnosis Date  . Ischemic heart disease     original CABG in 1984 with redo CABG in 1992; managed medically  . HTN (hypertension)   . Angina pectoris   . Hypercholesterolemia   . CHF (congestive heart failure)     EF is 35 to 40% per echo April 2013  . Arthritis   . Urinary incontinence   . Myocardial infarction   . Dysrhythmia     irregular  heart beats per patient  . Asthma   . Shortness of breath   . Diabetes mellitus     type 2  . GERD (gastroesophageal reflux disease)   . Neuromuscular disorder      periferal neruropathy  . Depression   . Syncope   . Subarachnoid hemorrhage April 2013    from fall/coumadin & aspirin stopped  . Coronary artery disease   . Pacemaker   . Chronic kidney disease     hx of acute renal failure   Past Surgical History  Procedure Laterality Date  . Cardiac catheterization  03/18/1998  . Coronary artery bypass graft  [id number]    first at [facility]  . Circumcision  02/05/08  . Eye surgery    . Cardiac stents    . Insert / replace / remove pacemaker  05/2012   Family History  Problem Relation Age of Onset  . Heart attack Father    History  Substance Use Topics  . Smoking status: Never Smoker   . Smokeless tobacco: Never Used  . Alcohol Use: No    Review of Systems  Gastrointestinal: Positive for vomiting.  All other systems reviewed and are negative.     Allergies  Penicillins; Lipitor; Macrodantin; Morphine and related; Ranolazine er; and Statins  Home Medications   Prior to Admission medications   Medication Sig Start Date End Date Taking? Authorizing Provider  ALPRAZolam (XANAX) 0.25 MG tablet Take 1 tablet (0.25 mg total) by mouth 2 (two) times daily as needed. 05/28/13   Cassell Clementhomas Brackbill, MD  amiodarone (PACERONE) 200 MG tablet Take 1/2 tablet (100  mg) daily 06/16/12 09/02/14  Cassell Clement, MD  aspirin EC 81 MG tablet Take 1 tablet (81 mg total) by mouth daily. Chest pain 06/16/12   Cassell Clement, MD  Cyanocobalamin (VITAMIN B 12 PO) Take 1 tablet by mouth every other day.     Historical Provider, MD  docusate sodium (COLACE) 100 MG capsule Take 1 capsule (100 mg total) by mouth daily. 06/16/12   Cassell Clement, MD  fenofibrate (TRICOR) 48 MG tablet Take 1 tablet (48 mg total) by mouth daily. 06/16/12 09/02/14  Cassell Clement, MD  ferrous sulfate 325 (65 FE) MG tablet Take 1 tablet (325 mg total) by mouth every other day. 06/16/12   Cassell Clement, MD  furosemide (LASIX) 20 MG tablet Take 20 mg by mouth daily.  06/16/12    Cassell Clement, MD  insulin aspart (NOVOLOG) 100 UNIT/ML injection Inject into the skin as directed. 5 units SQ if blood sugar above 300    Historical Provider, MD  insulin glargine (LANTUS) 100 UNIT/ML injection 05/21/14 Increase to 25 units; if AM sugar level after 3 days is above 150 then increase to 28 units 05/21/14   Cassell Clement, MD  isosorbide mononitrate (IMDUR) 30 MG 24 hr tablet Take 1 tablet (30 mg total) by mouth 2 (two) times daily. 06/16/12   Cassell Clement, MD  levothyroxine (SYNTHROID) 75 MCG tablet Take 1 tablet (75 mcg total) by mouth daily. 10/06/12   Cassell Clement, MD  losartan (COZAAR) 100 MG tablet Take 100 mg by mouth daily. 08/03/13   Cassell Clement, MD  nitroGLYCERIN (NITROSTAT) 0.4 MG SL tablet Place 1 tablet (0.4 mg total) under the tongue every 5 (five) minutes as needed. For chest pain. 06/16/12   Cassell Clement, MD  pantoprazole (PROTONIX) 40 MG tablet Take 1 tablet (40 mg total) by mouth daily. 06/16/12   Cassell Clement, MD   BP 114/46 mmHg  Pulse 60  Temp(Src) 98.8 F (37.1 C) (Oral)  Resp 15  SpO2 94% Physical Exam  Constitutional: He is oriented to person, place, and time. He appears well-nourished.  Chronically ill-appearing in mild distress  HENT:  Head: Atraumatic.  Eyes: EOM are normal. Pupils are equal, round, and reactive to light.  No nystagmus  Cardiovascular: Normal rate and regular rhythm.   Pulmonary/Chest: No respiratory distress.  Decreased air movement in bilateral bases  Abdominal: Soft. There is no tenderness. There is no rebound.  Musculoskeletal: He exhibits no edema or tenderness.  Decreased femoral and DP pulses bilaterally.  Neurological: He is alert and oriented to person, place, and time.  5 out of 5 grip strength in bilateral upper extremities. No facial asymmetry.  Skin: Skin is warm and dry.  Psychiatric: He has a normal mood and affect. His behavior is normal.  Nursing note and vitals reviewed.   ED Course   Procedures (including critical care time) Labs Review Labs Reviewed  CBC WITH DIFFERENTIAL - Abnormal; Notable for the following:    WBC 11.3 (*)    RBC 4.20 (*)    Hemoglobin 12.9 (*)    Neutrophils Relative % 84 (*)    Neutro Abs 9.5 (*)    Lymphocytes Relative 7 (*)    All other components within normal limits  COMPREHENSIVE METABOLIC PANEL - Abnormal; Notable for the following:    BUN 43 (*)    Creatinine, Ser 2.86 (*)    Albumin 3.3 (*)    GFR calc non Af Amer 18 (*)    GFR calc Af Amer 21 (*)  All other components within normal limits  TROPONIN I    Imaging Review No results found.   EKG Interpretation   Date/Time:  Sunday November 03 2014 09:53:01 EST Ventricular Rate:  64 PR Interval:  207 QRS Duration: 174 QT Interval:  492 QTC Calculation: 508 R Axis:   -76 Text Interpretation:  Sinus rhythm Right bundle branch block Confirmed by  Lincoln Brighamees, Liz [phone]([id number]) on 11/03/2014 4:33:46 PM      MDM   Final diagnoses:  Non-intractable vomiting with nausea, vomiting of unspecified type  Left leg pain    Patient's here from home hospice with vomiting and leg pain. Ultrasound of legs obtained to go for DVT given family concern. Emergency Department leg pain has resolved. Vascular ultrasound of the leg is negative for DVT there is no current evidence of infection on exam legs are perfused with dopplerable pulses.  Emergency department patient's vomiting is resolved and leg pain is resolved. Goals of care are symptom control at this time. Patient does have some renal insufficiency at baseline, mild increase in creatinine when compared to prior.patient has been evaluated by case management, plan [facility] due to decrease in his functional status at home and inability to ambulate at this time. Family in agreement with plan.   Tilden FossaElizabeth Tylin Stradley, MD 11/03/14 [phone]

## 2014-11-03 NOTE — ED Notes (Signed)
Pt's condom cath was accidentally removed in US.  Applied new Condom cath without issue.

## 2014-11-03 NOTE — ED Notes (Addendum)
Pt presents from home with c/o LLE pain that began 3 days ago and Nausea with vomiting that began this morning. Pt is a hospice patient and has DNR.  Pt is arousable to verbal stimulation and is oriented x4. Pt received 500mL NS and 4mg  IV Zofran PTA.

## 2014-11-03 NOTE — ED Notes (Signed)
[facility] arrived for transport 

## 2014-11-03 NOTE — ED Notes (Signed)
[facility]

## 2014-11-03 NOTE — Care Management (Signed)
Patient presented to ED from home via EMS with leg pain, and unable to walk. ED evaluation V/S, Labs are unremarkable, U/S pending . ED CM's  met with patient and family at bedside regarding family requests for in patient hospice care, family stated that patient is now starting to decline.  Patient lives at home with family as primary care givers with [facility] 4 hours/day, and is  active with [facility]. Family feels like patient is on the decline, explained hospice care and their services. Spoke with Junie Panning RN hospice nurse, patient currently not meeting criteria for inpatient bed at  hospice with [facility], or hospital admission; This was explained to family verbalized understanding.  Family requested  respite care at [facility] if bed is available, Erin discussed with [facility] intake bed available. Family made aware.and agreeable with disposition plan.  Updated Raquel Sarna RN and Dr. Ralene Bathe on disposition plan. Patient will be transported to [facility] by [facility].  Contact information provided should family have any further questions or concerns. No further ED CM needs identified

## 2014-11-05 NOTE — Telephone Encounter (Signed)
This record was opened in error   Beecher McardleW. Aela Bohan RN BSN NCM

## 2014-11-06 ENCOUNTER — Telehealth: Payer: Self-pay | Admitting: Cardiology

## 2014-11-06 NOTE — Telephone Encounter (Signed)
New message     Patient son calling stating his father is at [facility] in room 1 . Wanted to let Dr. Patty SermonsBrackbill know.

## 2014-11-06 NOTE — Telephone Encounter (Signed)
Thanks

## 2014-11-21 ENCOUNTER — Telehealth: Payer: Self-pay | Admitting: *Deleted

## 2014-11-21 NOTE — Telephone Encounter (Signed)
New Message  Left vm for pt concerning flu shot 

## 2014-11-25 ENCOUNTER — Ambulatory Visit (INDEPENDENT_AMBULATORY_CARE_PROVIDER_SITE_OTHER): Admitting: *Deleted

## 2014-11-25 DIAGNOSIS — I495 Sick sinus syndrome: Secondary | ICD-10-CM

## 2014-11-25 NOTE — Progress Notes (Signed)
Remote pacemaker transmission.   

## 2014-11-26 ENCOUNTER — Telehealth: Payer: Self-pay | Admitting: Cardiology

## 2014-11-26 NOTE — Telephone Encounter (Signed)
New message         Pt's son calling to let Dr. Patty SermonsBrackbill know that pt is bed bound and no longer able to make any future appts and is under the care of hospice.

## 2014-11-26 NOTE — Telephone Encounter (Signed)
Will forward to  Dr. Brackbill for review 

## 2014-11-26 NOTE — Telephone Encounter (Signed)
Okay 

## 2014-11-28 ENCOUNTER — Encounter (HOSPITAL_COMMUNITY): Payer: Self-pay | Admitting: Internal Medicine

## 2014-11-30 LAB — MDC_IDC_ENUM_SESS_TYPE_REMOTE
Battery Impedance: 228 Ohm
Battery Remaining Longevity: 103 mo
Brady Statistic AP VP Percent: 43 %
Brady Statistic AP VS Percent: 26 %
Brady Statistic AS VP Percent: 0 %
Brady Statistic AS VS Percent: 31 %
Date Time Interrogation Session: 20151207121740
Lead Channel Impedance Value: 448 Ohm
Lead Channel Impedance Value: 533 Ohm
Lead Channel Pacing Threshold Amplitude: 0.625 V
Lead Channel Pacing Threshold Amplitude: 0.625 V
Lead Channel Pacing Threshold Pulse Width: 0.4 ms
Lead Channel Sensing Intrinsic Amplitude: 2.8 mV
Lead Channel Sensing Intrinsic Amplitude: 22.4 mV
Lead Channel Setting Pacing Amplitude: 2 V
Lead Channel Setting Sensing Sensitivity: 5.6 mV
MDC IDC MSMT BATTERY VOLTAGE: 2.79 V
MDC IDC MSMT LEADCHNL RV PACING THRESHOLD PULSEWIDTH: 0.4 ms
MDC IDC SET LEADCHNL RV PACING AMPLITUDE: 2.5 V
MDC IDC SET LEADCHNL RV PACING PULSEWIDTH: 0.4 ms

## 2014-12-06 ENCOUNTER — Encounter: Payer: Self-pay | Admitting: Cardiology

## 2014-12-09 ENCOUNTER — Ambulatory Visit: Payer: Medicare Other | Admitting: Cardiology

## 2014-12-16 ENCOUNTER — Encounter: Payer: Self-pay | Admitting: Internal Medicine

## 2015-01-17 ENCOUNTER — Telehealth: Payer: Self-pay | Admitting: Cardiology

## 2015-01-17 NOTE — Telephone Encounter (Signed)
New Msg         Pt son Amada JupiterDale calling, requesting to be called back.   No details given.

## 2015-01-17 NOTE — Telephone Encounter (Signed)
Needs to drop off FMLA forms and letter for being with dad during snow Left message to call back to verify dates 1/20-1/23

## 2015-01-20 NOTE — Telephone Encounter (Signed)
Thanks for letting me know!

## 2015-01-20 NOTE — Telephone Encounter (Signed)
I will forward to Dr Patty SermonsBrackbill for review.

## 2015-01-20 NOTE — Telephone Encounter (Signed)
New Message        Hospice calling stating that pt was [facility] Saturday, pt has declined significantly to warrant an admission. If Dr. Patty SermonsBrackbill has any questions please call.

## 2015-01-21 ENCOUNTER — Telehealth: Payer: Self-pay | Admitting: Cardiology

## 2015-01-21 ENCOUNTER — Encounter: Payer: Self-pay | Admitting: *Deleted

## 2015-01-21 NOTE — Telephone Encounter (Signed)
Letter done for son as requested

## 2015-01-21 NOTE — Telephone Encounter (Signed)
New Msg        Pt son calling.   1. Are you calling in reference to your FMLA or disability form? Yes FMLA  2. What is your question in regards to FMLA or disability form? Has FMLA been completed?   3. Do you need copies of your medical records?No  4. Are you waiting on a nurse to call you back with results or are you wanting copies of your results? Yes both    Please contact pt son Benjamin Noble in regards to this.

## 2015-01-22 NOTE — Telephone Encounter (Signed)
Follow Up         Pt's son returning phone call from [place]. Please call back and advise.

## 2015-01-22 NOTE — Telephone Encounter (Signed)
Spoke with patient and Benjamin BattenKim in Medical records, son will bring papers by today

## 2015-01-27 ENCOUNTER — Telehealth: Payer: Self-pay | Admitting: Cardiology

## 2015-02-03 ENCOUNTER — Telehealth: Payer: Self-pay | Admitting: Cardiology

## 2015-02-03 NOTE — Telephone Encounter (Signed)
Original D/C received from [facility] [place] [facility], gave to [place] for Dr. Patty SermonsBrackbill signature he is not back in the office until Wednesday 2.17.16.

## 2015-02-05 ENCOUNTER — Telehealth: Payer: Self-pay | Admitting: Cardiology

## 2015-02-05 NOTE — Telephone Encounter (Signed)
[employer] [place] [phone]

## 2015-02-05 NOTE — Telephone Encounter (Signed)
Dr.Brackbill signed death certificate, [employer] Louanne SkyeDick called and made aware ready for pick up.

## 2015-02-18 NOTE — Telephone Encounter (Signed)
Dr. Brackbill aware 

## 2015-02-18 NOTE — Telephone Encounter (Signed)
New message      Want dr to know that pt expired at 12:15 today

## 2015-02-18 DEATH — deceased

## 2015-02-24 ENCOUNTER — Encounter
